# Patient Record
Sex: Female | Born: 1965 | Race: Black or African American | Hispanic: No | Marital: Single | Smoking: Current every day smoker
Health system: Southern US, Community
[De-identification: ages and names within clinical notes are randomized; demographics above are authoritative.]

## PROBLEM LIST (undated history)

## (undated) DIAGNOSIS — M719 Bursopathy, unspecified: Secondary | ICD-10-CM

## (undated) DIAGNOSIS — M1990 Unspecified osteoarthritis, unspecified site: Secondary | ICD-10-CM

## (undated) DIAGNOSIS — F32A Depression, unspecified: Secondary | ICD-10-CM

## (undated) DIAGNOSIS — B029 Zoster without complications: Secondary | ICD-10-CM

## (undated) DIAGNOSIS — I8290 Acute embolism and thrombosis of unspecified vein: Secondary | ICD-10-CM

## (undated) DIAGNOSIS — I824Z9 Acute embolism and thrombosis of unspecified deep veins of unspecified distal lower extremity: Secondary | ICD-10-CM

## (undated) DIAGNOSIS — F329 Major depressive disorder, single episode, unspecified: Secondary | ICD-10-CM

## (undated) HISTORY — DX: Depression, unspecified: F32.A

## (undated) HISTORY — DX: Acute embolism and thrombosis of unspecified deep veins of unspecified distal lower extremity: I82.4Z9

## (undated) HISTORY — DX: Major depressive disorder, single episode, unspecified: F32.9

## (undated) HISTORY — PX: LASER ABLATION: SHX1947

## (undated) HISTORY — PX: APPENDECTOMY: SHX54

---

## 2009-09-29 ENCOUNTER — Emergency Department (HOSPITAL_COMMUNITY): Admission: EM | Admit: 2009-09-29 | Discharge: 2009-09-29 | Payer: Self-pay | Admitting: Emergency Medicine

## 2010-04-11 ENCOUNTER — Emergency Department (HOSPITAL_COMMUNITY)
Admission: EM | Admit: 2010-04-11 | Discharge: 2010-04-11 | Payer: Self-pay | Source: Home / Self Care | Admitting: Emergency Medicine

## 2010-05-13 ENCOUNTER — Inpatient Hospital Stay (HOSPITAL_COMMUNITY)
Admission: EM | Admit: 2010-05-13 | Discharge: 2010-05-16 | Disposition: A | Discharge status: Other Acute Inpatient Hospital | Payer: Self-pay | Source: Home / Self Care | Admitting: Emergency Medicine

## 2010-05-16 ENCOUNTER — Inpatient Hospital Stay (HOSPITAL_COMMUNITY): Admission: AD | Admit: 2010-05-16 | Discharge: 2010-05-17 | Payer: Self-pay | Admitting: Psychiatry

## 2010-05-16 ENCOUNTER — Ambulatory Visit: Payer: Self-pay | Admitting: Psychiatry

## 2010-05-28 ENCOUNTER — Ambulatory Visit: Payer: Self-pay | Admitting: Family Medicine

## 2010-05-28 LAB — CONVERTED CEMR LAB: Total Protein: 7.3 g/dL (ref 6.0–8.3)

## 2010-06-07 ENCOUNTER — Ambulatory Visit: Payer: Self-pay | Admitting: Family Medicine

## 2010-07-06 ENCOUNTER — Ambulatory Visit: Payer: Self-pay | Admitting: Psychiatry

## 2010-10-05 ENCOUNTER — Emergency Department (HOSPITAL_COMMUNITY)
Admission: EM | Admit: 2010-10-05 | Discharge: 2010-10-05 | Payer: Self-pay | Source: Home / Self Care | Admitting: Emergency Medicine

## 2010-12-27 LAB — COMPREHENSIVE METABOLIC PANEL
ALT: 12 U/L (ref 0–35)
ALT: 12 U/L (ref 0–35)
ALT: 13 U/L (ref 0–35)
ALT: 9 U/L (ref 0–35)
AST: 18 U/L (ref 0–37)
AST: 22 U/L (ref 0–37)
Albumin: 3.6 g/dL (ref 3.5–5.2)
Alkaline Phosphatase: 31 U/L — ABNORMAL LOW (ref 39–117)
Alkaline Phosphatase: 36 U/L — ABNORMAL LOW (ref 39–117)
Alkaline Phosphatase: 37 U/L — ABNORMAL LOW (ref 39–117)
BUN: 4 mg/dL — ABNORMAL LOW (ref 6–23)
BUN: 4 mg/dL — ABNORMAL LOW (ref 6–23)
BUN: 5 mg/dL — ABNORMAL LOW (ref 6–23)
BUN: 8 mg/dL (ref 6–23)
CO2: 17 mEq/L — ABNORMAL LOW (ref 19–32)
CO2: 26 mEq/L (ref 19–32)
Chloride: 109 mEq/L (ref 96–112)
Chloride: 109 mEq/L (ref 96–112)
Chloride: 110 mEq/L (ref 96–112)
GFR calc Af Amer: >60 mL/min (ref >60)
GFR calc Af Amer: >60 mL/min (ref >60)
GFR calc non Af Amer: >60 mL/min (ref >60)
GFR calc non Af Amer: >60 mL/min (ref >60)
GFR calc non Af Amer: >60 mL/min (ref >60)
Glucose, Bld: 104 mg/dL — ABNORMAL HIGH (ref 70–99)
Glucose, Bld: 111 mg/dL — ABNORMAL HIGH (ref 70–99)
Glucose, Bld: 126 mg/dL — ABNORMAL HIGH (ref 70–99)
Glucose, Bld: 99 mg/dL (ref 70–99)
Potassium: 3.4 mEq/L — ABNORMAL LOW (ref 3.5–5.1)
Potassium: 3.6 mEq/L (ref 3.5–5.1)
Potassium: 3.7 mEq/L (ref 3.5–5.1)
Potassium: 3.8 mEq/L (ref 3.5–5.1)
Sodium: 137 mEq/L (ref 135–145)
Sodium: 137 mEq/L (ref 135–145)
Sodium: 140 mEq/L (ref 135–145)
Total Bilirubin: 3.1 mg/dL — ABNORMAL HIGH (ref 0.3–1.2)
Total Bilirubin: 3.6 mg/dL — ABNORMAL HIGH (ref 0.3–1.2)
Total Protein: 6.7 g/dL (ref 6.0–8.3)
Total Protein: 6.8 g/dL (ref 6.0–8.3)

## 2010-12-27 LAB — URINALYSIS, ROUTINE W REFLEX MICROSCOPIC
Bilirubin Urine: NEGATIVE
Hgb urine dipstick: NEGATIVE
Protein, ur: NEGATIVE mg/dL
pH: 5.5 (ref 5.0–8.0)

## 2010-12-27 LAB — CBC
HCT: 38.4 % (ref 36.0–46.0)
HCT: 38.6 % (ref 36.0–46.0)
HCT: 40.3 % (ref 36.0–46.0)
Hemoglobin: 12.9 g/dL (ref 12.0–15.0)
Hemoglobin: 14.2 g/dL (ref 12.0–15.0)
MCH: 30.7 pg (ref 26.0–34.0)
MCHC: 35.2 g/dL (ref 30.0–36.0)
MCV: 88.8 fL (ref 78.0–100.0)
MCV: 90.1 fL (ref 78.0–100.0)
Platelets: 228 10*3/uL (ref 150–400)
Platelets: 239 10*3/uL (ref 150–400)
RBC: 4.54 MIL/uL (ref 3.87–5.11)
RDW: 12.7 % (ref 11.5–15.5)
WBC: 7 10*3/uL (ref 4.0–10.5)
WBC: 9.2 10*3/uL (ref 4.0–10.5)
WBC: 9.3 10*3/uL (ref 4.0–10.5)

## 2010-12-27 LAB — PROTIME-INR
INR: 1.01 (ref 0.00–1.49)
INR: 1.06 (ref 0.00–1.49)
INR: 1.2 (ref 0.00–1.49)
Prothrombin Time: 13.5 seconds (ref 11.6–15.2)
Prothrombin Time: 14 seconds (ref 11.6–15.2)

## 2010-12-27 LAB — POCT PREGNANCY, URINE: Preg Test, Ur: NEGATIVE

## 2010-12-27 LAB — HEPATIC FUNCTION PANEL
ALT: 10 U/L (ref 0–35)
AST: 18 U/L (ref 0–37)
Bilirubin, Direct: 0.3 mg/dL (ref 0.0–0.3)
Indirect Bilirubin: 3.8 mg/dL — ABNORMAL HIGH (ref 0.3–0.9)
Total Bilirubin: 4.1 mg/dL — ABNORMAL HIGH (ref 0.3–1.2)

## 2010-12-27 LAB — RAPID URINE DRUG SCREEN, HOSP PERFORMED
Barbiturates: NOT DETECTED
Benzodiazepines: NOT DETECTED
Opiates: POSITIVE — AB

## 2010-12-27 LAB — LACTATE DEHYDROGENASE: LDH: 181 U/L (ref 94–250)

## 2010-12-27 LAB — ACETAMINOPHEN LEVEL
Acetaminophen (Tylenol), Serum: 251.1 ug/mL (ref 10–30)
Acetaminophen (Tylenol), Serum: <10 ug/mL — ABNORMAL LOW (ref 10–30)

## 2010-12-27 LAB — MAGNESIUM: Magnesium: 2.2 mg/dL (ref 1.5–2.5)

## 2010-12-27 LAB — TSH: TSH: 2.661 u[IU]/mL (ref 0.350–4.500)

## 2010-12-27 LAB — DIFFERENTIAL
Basophils Absolute: 0 10*3/uL (ref 0.0–0.1)
Basophils Absolute: 0 10*3/uL (ref 0.0–0.1)
Basophils Relative: 0 % (ref 0–1)
Basophils Relative: 0 % (ref 0–1)
Eosinophils Absolute: 0 10*3/uL (ref 0.0–0.7)
Eosinophils Absolute: 0.1 10*3/uL (ref 0.0–0.7)
Eosinophils Relative: 0 % (ref 0–5)
Lymphocytes Relative: 18 % (ref 12–46)
Lymphs Abs: 1.6 10*3/uL (ref 0.7–4.0)
Neutrophils Relative %: 72 % (ref 43–77)
Neutrophils Relative %: 74 % (ref 43–77)

## 2010-12-27 LAB — SALICYLATE LEVEL: Salicylate Lvl: <4 mg/dL (ref 2.8–20.0)

## 2010-12-27 LAB — APTT: aPTT: 29 seconds (ref 24–37)

## 2010-12-27 LAB — SAVE SMEAR

## 2010-12-29 LAB — CBC
HCT: 42.8 % (ref 36.0–46.0)
MCHC: 34 g/dL (ref 30.0–36.0)
Platelets: 205 10*3/uL (ref 150–400)
RDW: 13.3 % (ref 11.5–15.5)

## 2010-12-29 LAB — POCT I-STAT, CHEM 8
Creatinine, Ser: 0.9 mg/dL (ref 0.4–1.2)
Hemoglobin: 15.3 g/dL — ABNORMAL HIGH (ref 12.0–15.0)
Sodium: 140 mEq/L (ref 135–145)
TCO2: 23 mmol/L (ref 0–100)

## 2010-12-29 LAB — DIFFERENTIAL
Basophils Absolute: 0 10*3/uL (ref 0.0–0.1)
Basophils Relative: 1 % (ref 0–1)
Monocytes Absolute: 0.4 10*3/uL (ref 0.1–1.0)
Neutro Abs: 4.2 10*3/uL (ref 1.7–7.7)
Neutrophils Relative %: 60 % (ref 43–77)

## 2010-12-29 LAB — POCT CARDIAC MARKERS: Myoglobin, poc: 38.4 ng/mL (ref 12–200)

## 2011-01-15 ENCOUNTER — Inpatient Hospital Stay (INDEPENDENT_AMBULATORY_CARE_PROVIDER_SITE_OTHER)
Admission: RE | Admit: 2011-01-15 | Discharge: 2011-01-15 | Disposition: A | Discharge status: Home / Self Care | Payer: Self-pay | Source: Ambulatory Visit | Attending: Family Medicine | Admitting: Family Medicine

## 2011-01-15 DIAGNOSIS — N739 Female pelvic inflammatory disease, unspecified: Secondary | ICD-10-CM

## 2011-01-15 LAB — POCT URINALYSIS DIP (DEVICE)
Bilirubin Urine: NEGATIVE
Hgb urine dipstick: NEGATIVE
Ketones, ur: NEGATIVE mg/dL
pH: 5 (ref 5.0–8.0)

## 2011-01-15 LAB — DIFFERENTIAL
Eosinophils Relative: 1 % (ref 0–5)
Lymphocytes Relative: 32 % (ref 12–46)
Lymphs Abs: 2.7 10*3/uL (ref 0.7–4.0)
Monocytes Absolute: 0.5 10*3/uL (ref 0.1–1.0)
Monocytes Relative: 6 % (ref 3–12)

## 2011-01-15 LAB — CBC
HCT: 40.7 % (ref 36.0–46.0)
MCHC: 34.6 g/dL (ref 30.0–36.0)
MCV: 87.5 fL (ref 78.0–100.0)
RDW: 13 % (ref 11.5–15.5)

## 2011-01-16 LAB — GC/CHLAMYDIA PROBE AMP, GENITAL: Chlamydia, DNA Probe: NEGATIVE

## 2011-01-16 LAB — URINE CULTURE
Colony Count: NO GROWTH
Culture  Setup Time: 201204041740

## 2011-08-22 ENCOUNTER — Emergency Department (INDEPENDENT_AMBULATORY_CARE_PROVIDER_SITE_OTHER)
Admission: EM | Admit: 2011-08-22 | Discharge: 2011-08-22 | Disposition: A | Discharge status: Home / Self Care | Payer: Self-pay | Source: Home / Self Care | Attending: Emergency Medicine | Admitting: Emergency Medicine

## 2011-08-22 DIAGNOSIS — H6000 Abscess of external ear, unspecified ear: Secondary | ICD-10-CM

## 2011-08-22 DIAGNOSIS — H60399 Other infective otitis externa, unspecified ear: Secondary | ICD-10-CM

## 2011-08-22 HISTORY — DX: Unspecified osteoarthritis, unspecified site: M19.90

## 2011-08-22 MED ORDER — METRONIDAZOLE 0.75 % VA GEL: 1.0000 | Freq: Two times a day (BID) | VAGINAL | Status: AC

## 2011-08-22 MED ORDER — CEPHALEXIN 500 MG PO CAPS: 500.0000 mg | ORAL_CAPSULE | Freq: Three times a day (TID) | ORAL | Status: AC

## 2011-08-22 MED ORDER — ACETAMINOPHEN-CODEINE #3 300-30 MG PO TABS
1.0000 | ORAL_TABLET | ORAL | Status: AC | PRN
Start: 2011-08-22 — End: 2011-09-01

## 2011-08-22 NOTE — ED Notes (Signed)
Pain in her ear since yesterday; no relief w OTC medications

## 2011-08-22 NOTE — ED Provider Notes (Signed)
History     CSN: [id number] Arrival date & time: 08/22/2011  4:51 PM   First MD Initiated Contact with Patient 08/22/11 1652      Chief Complaint  Patient presents with  . Otalgia    (Consider location/radiation/quality/duration/timing/severity/associated sxs/prior treatment) HPI Comments: Janet Hester has had a two-day history of severe right ear pain. She has had chills, weakness, and slight sore throat. She denies any fever, nasal congestion, rhinorrhea, or cough. She tried ibuprofen but it didn't seem to help at all. She rates her pain as a 10 over 10 in intensity. It's worse if she chews or moves her ear in any way. She denies any drainage from the ear. She has not had ear problems in the past.  She is otherwise in good health and has had no other medical problems. She takes no medications and she's allergic to shellfish. Her last menstrual period was in 2005 when she had a uterine ablation. Her primary care physicians are [facility].  Patient is a 45 y.o. female presenting with ear pain.  Otalgia Pertinent negatives include no rhinorrhea, no sore throat, no abdominal pain, no diarrhea, no vomiting, no cough and no rash.    Past Medical History  Diagnosis Date  . Arthritis   . Hepatitis C     History reviewed. No pertinent past surgical history.  History reviewed. No pertinent family history.  History  Substance Use Topics  . Smoking status: Not on file  . Smokeless tobacco: Not on file  . Alcohol Use:     OB History    Grav Para Term Preterm Abortions TAB SAB Ect Mult Living                  Review of Systems  Constitutional: Negative for fever, chills and fatigue.  HENT: Positive for ear pain. Negative for congestion, sore throat, rhinorrhea, sneezing, neck stiffness, voice change and postnasal drip.   Eyes: Negative for pain, discharge and redness.  Respiratory: Negative for cough, chest tightness, shortness of breath and wheezing.     Gastrointestinal: Negative for nausea, vomiting, abdominal pain and diarrhea.  Skin: Negative for rash.    Allergies  Shellfish allergy  Home Medications   Current Outpatient Rx  Name Route Sig Dispense Refill  . ACETAMINOPHEN-CODEINE #3 300-30 MG PO TABS Oral Take 1-2 tablets by mouth every 4 (four) hours as needed for pain. 30 tablet 0  . CEPHALEXIN 500 MG PO CAPS Oral Take 1 capsule (500 mg total) by mouth 3 (three) times daily. 30 capsule 0  . METRONIDAZOLE 0.75 % VA GEL Vaginal Place 1 Applicatorful vaginally 2 (two) times daily. 70 g 0    BP 138/88  Pulse 77  Temp(Src) 99.1 F (37.3 C) (Oral)  Resp 18  SpO2 100%  Physical Exam  Nursing note and vitals reviewed. Constitutional: She appears well-developed and well-nourished. No distress.  HENT:  Head: Normocephalic and atraumatic.  Left Ear: External ear normal.  Nose: Nose normal.  Mouth/Throat: Oropharynx is clear and moist. No oropharyngeal exudate.       Lee right TM was normal with no inflammation or fluid behind it the distal ear canal was slightly swollen and there was a localized area of erythema and tenderness. There was no collection of pus. This was very tender to palpation.  Eyes: Conjunctivae and EOM are normal. Pupils are equal, round, and reactive to light. Right eye exhibits no discharge. Left eye exhibits no discharge.  Neck: Normal range of motion.  Neck supple.  Cardiovascular: Normal rate, regular rhythm and normal heart sounds.   Pulmonary/Chest: Effort normal and breath sounds normal. No stridor. No respiratory distress. She has no wheezes. She has no rales. She exhibits no tenderness.  Lymphadenopathy:    She has no cervical adenopathy.  Skin: Skin is warm and dry. No rash noted. She is not diaphoretic.    ED Course  Procedures (including critical care time)  Labs Reviewed - No data to display No results found.   1. Abscess of ear canal       MDM  She has a small, localized furuncle of  her external ear canal. The tympanic membrane itself is normal.        Roque Lias, MD 08/22/11 1742

## 2011-11-29 ENCOUNTER — Encounter (HOSPITAL_COMMUNITY): Payer: Self-pay | Admitting: Emergency Medicine

## 2011-11-29 ENCOUNTER — Emergency Department (HOSPITAL_COMMUNITY): Payer: Managed Care, Other (non HMO)

## 2011-11-29 ENCOUNTER — Emergency Department (HOSPITAL_COMMUNITY)
Admission: EM | Admit: 2011-11-29 | Discharge: 2011-11-29 | Disposition: A | Discharge status: Home / Self Care | Payer: Managed Care, Other (non HMO) | Attending: Emergency Medicine | Admitting: Emergency Medicine

## 2011-11-29 DIAGNOSIS — F17200 Nicotine dependence, unspecified, uncomplicated: Secondary | ICD-10-CM | POA: Insufficient documentation

## 2011-11-29 DIAGNOSIS — M129 Arthropathy, unspecified: Secondary | ICD-10-CM | POA: Insufficient documentation

## 2011-11-29 DIAGNOSIS — R109 Unspecified abdominal pain: Secondary | ICD-10-CM | POA: Insufficient documentation

## 2011-11-29 DIAGNOSIS — R10816 Epigastric abdominal tenderness: Secondary | ICD-10-CM | POA: Insufficient documentation

## 2011-11-29 DIAGNOSIS — Z8619 Personal history of other infectious and parasitic diseases: Secondary | ICD-10-CM | POA: Insufficient documentation

## 2011-11-29 DIAGNOSIS — R112 Nausea with vomiting, unspecified: Secondary | ICD-10-CM | POA: Insufficient documentation

## 2011-11-29 DIAGNOSIS — R197 Diarrhea, unspecified: Secondary | ICD-10-CM | POA: Insufficient documentation

## 2011-11-29 LAB — LIPASE, BLOOD: Lipase: 16 U/L (ref 11–59)

## 2011-11-29 LAB — COMPREHENSIVE METABOLIC PANEL
Alkaline Phosphatase: 41 U/L (ref 39–117)
BUN: 8 mg/dL (ref 6–23)
Calcium: 9.4 mg/dL (ref 8.4–10.5)
GFR calc Af Amer: >90 mL/min (ref >90)
Glucose, Bld: 82 mg/dL (ref 70–99)
Total Protein: 7.4 g/dL (ref 6.0–8.3)

## 2011-11-29 LAB — CBC
HCT: 40.4 % (ref 36.0–46.0)
Hemoglobin: 14.2 g/dL (ref 12.0–15.0)
MCH: 30.5 pg (ref 26.0–34.0)
MCHC: 35.1 g/dL (ref 30.0–36.0)

## 2011-11-29 MED ORDER — SODIUM CHLORIDE 0.9 % IV BOLUS (SEPSIS)
1000.0000 mL | Freq: Once | INTRAVENOUS | Status: AC
  Administered 2011-11-29: 1000 mL via INTRAVENOUS

## 2011-11-29 MED ORDER — HYDROMORPHONE HCL PF 1 MG/ML IJ SOLN
1.0000 mg | Freq: Once | INTRAMUSCULAR | Status: AC
  Administered 2011-11-29: 1 mg via INTRAVENOUS
  Filled 2011-11-29: qty 1

## 2011-11-29 MED ORDER — ONDANSETRON HCL 4 MG/2ML IJ SOLN
4.0000 mg | Freq: Once | INTRAMUSCULAR | Status: AC
  Administered 2011-11-29: 4 mg via INTRAVENOUS
  Filled 2011-11-29: qty 2

## 2011-11-29 MED ORDER — METOCLOPRAMIDE HCL 10 MG PO TABS: 10.0000 mg | ORAL_TABLET | Freq: Four times a day (QID) | ORAL | Status: AC | PRN

## 2011-11-29 NOTE — ED Provider Notes (Signed)
I was the primary provider of this patient during this ER visit. The patients care was continued in the CDU and managed in conjunction with my midlevel providers   Lyanne Co, MD 11/29/11 2049

## 2011-11-29 NOTE — ED Provider Notes (Signed)
History     CSN: [id number]  Arrival date & time 11/29/11  1203   First MD Initiated Contact with Patient 11/29/11 1251      Chief Complaint  Patient presents with  . Abdominal Pain     Patient is a 46 y.o. female presenting with abdominal pain. The history is provided by the patient.  Abdominal Pain The primary symptoms of the illness include abdominal pain.   patient reports she was awoken this morning with severe upper abdominal pain.  She reports the pain comes and goes.  She reports when the pain comes it makes her extremely nauseated and she struck several times.  She denies bloody or bilious vomit.  She also reports development of new diarrhea today.  She denies melena or hematochezia.  She denies dysuria and urinary frequency.  She denies fevers or chills.  She's never had symptoms like this before.  She still has her gallbladder but has never been told she has gallstones.  Nothing worsens her symptoms.  Nothing improves her symptoms.  Her pain is moderate to severe.  Past Medical History  Diagnosis Date  . Arthritis   . Hepatitis C     Past Surgical History  Procedure Date  . Laser ablation     History reviewed. No pertinent family history.  History  Substance Use Topics  . Smoking status: Current Everyday Smoker -- 0.5 packs/day  . Smokeless tobacco: Not on file  . Alcohol Use: No    OB History    Grav Para Term Preterm Abortions TAB SAB Ect Mult Living                  Review of Systems  Gastrointestinal: Positive for abdominal pain.  All other systems reviewed and are negative.    Allergies  Shellfish allergy  Home Medications   Current Outpatient Rx  Name Route Sig Dispense Refill  . IBUPROFEN 200 MG PO TABS Oral Take 400 mg by mouth every 6 (six) hours as needed. For pain    . OVER THE COUNTER MEDICATION Oral Take 1 tablet by mouth 3 (three) times daily after meals. Weight loss product from [employer]      BP 126/83  Pulse 89  Temp(Src) 98.7 F  (37.1 C) (Oral)  Resp 20  SpO2 95%  Physical Exam  Nursing note and vitals reviewed. Constitutional: She is oriented to person, place, and time. She appears well-developed and well-nourished.       Tearful.  Uncomfortable appearing.  HENT:  Head: Normocephalic and atraumatic.  Eyes: EOM are normal.  Neck: Normal range of motion.  Cardiovascular: Normal rate, regular rhythm and normal heart sounds.   Pulmonary/Chest: Effort normal and breath sounds normal.  Abdominal: Soft. She exhibits no distension.       Mild epigastric tenderness without guarding or rebound  Musculoskeletal: Normal range of motion.  Neurological: She is alert and oriented to person, place, and time.  Skin: Skin is warm and dry.  Psychiatric: She has a normal mood and affect. Judgment normal.    ED Course  Procedures (including critical care time)   Labs Reviewed  CBC  COMPREHENSIVE METABOLIC PANEL  LIPASE, BLOOD   US Abdomen Complete  11/29/2011  *RADIOLOGY REPORT*  Clinical Data:  Abdominal pain, nausea, vomiting, hepatitis C.  COMPLETE ABDOMINAL ULTRASOUND  Comparison:  05/15/2010  Findings:  Gallbladder:  No shadowing gallstones or echogenic sludge.  No gallbladder wall thickening or pericholecystic fluid.  Negative sonographic Murphy's sign according to  the ultrasound technologist.  Common bile duct:  5 mm diameter, unremarkable.  Liver:  No focal lesion identified.  Within normal limits in parenchymal echogenicity.  IVC:  Appears normal.  Pancreas:  No focal abnormality seen.  Spleen:  7.5 cm craniocaudal length, unremarkable.  Right Kidney:  11.1 cm. No hydronephrosis.  Well-preserved cortex. Normal size and parenchymal echotexture without focal abnormalities.  Left Kidney:  11 cm. No hydronephrosis.  Well-preserved cortex. Normal size and parenchymal echotexture without focal abnormalities.  Abdominal aorta:  No aneurysm identified.  IMPRESSION:  1.  Negative.  Normal gallbladder.  Original Report  Authenticated By: Thora Lance III, M.D.     1. Nausea vomiting and diarrhea       MDM  This may represent biliary colic.  Will treat pain and nausea.  Fluids given.  Labs and ultrasound pending.  We'll move the patient to the CDU for further treatment and evaluation        Lyanne Co, MD 11/29/11 2049

## 2011-11-29 NOTE — ED Provider Notes (Signed)
Patient moved into CDU. Pain is improved. Waiting on Abdominal Ultrasound.  16:35 - Ultrasound negative. The patient is not vomiting, or having any recurrent frequent bowel movements here. Will discharge home to follow up with her doctor.   Rodena Medin, PA-C 11/29/11 1635

## 2011-11-29 NOTE — ED Notes (Signed)
Pt reports generalized abdominal pain onset 0300 today. Pt reports N/V/D also.

## 2011-11-29 NOTE — ED Notes (Signed)
Pt transported to ultrasound for [place] of abd for abd pain

## 2011-11-29 NOTE — Discharge Instructions (Signed)
B.R.A.T. Diet    Your doctor has recommended the B.R.A.T. diet for you or your child until the condition improves. This is often used to help control diarrhea and vomiting symptoms. If you or your child can tolerate clear liquids, you may have:  · Bananas.   · Rice.   · Applesauce.   · Toast (and other simple starches such as crackers, potatoes, noodles).   Be sure to avoid dairy products, meats, and fatty foods until symptoms are better. Fruit juices such as apple, grape, and prune juice can make diarrhea worse. Avoid these. Continue this diet for 2 days or as instructed by your caregiver.  Document Released: 09/29/2005 Document Revised: 06/11/2011 Document Reviewed: 03/18/2007  [employer]® Patient Information ©2012 [employer].

## 2012-01-12 ENCOUNTER — Other Ambulatory Visit (HOSPITAL_COMMUNITY)
Admission: RE | Admit: 2012-01-12 | Discharge: 2012-01-12 | Disposition: A | Discharge status: Home / Self Care | Payer: Managed Care, Other (non HMO) | Source: Ambulatory Visit | Attending: Family Medicine | Admitting: Family Medicine

## 2012-01-12 DIAGNOSIS — Z0000 Encounter for general adult medical examination without abnormal findings: Secondary | ICD-10-CM | POA: Insufficient documentation

## 2012-01-20 ENCOUNTER — Other Ambulatory Visit (HOSPITAL_COMMUNITY): Payer: Self-pay | Admitting: Family Medicine

## 2012-01-20 DIAGNOSIS — Z1231 Encounter for screening mammogram for malignant neoplasm of breast: Secondary | ICD-10-CM

## 2012-02-13 ENCOUNTER — Ambulatory Visit (HOSPITAL_COMMUNITY)
Admission: RE | Admit: 2012-02-13 | Discharge: 2012-02-13 | Disposition: A | Discharge status: Home / Self Care | Payer: Managed Care, Other (non HMO) | Source: Ambulatory Visit | Attending: Family Medicine | Admitting: Family Medicine

## 2012-02-13 DIAGNOSIS — Z1231 Encounter for screening mammogram for malignant neoplasm of breast: Secondary | ICD-10-CM | POA: Insufficient documentation

## 2012-02-13 LAB — HM MAMMOGRAPHY: HM Mammogram: NORMAL

## 2012-03-13 LAB — HM PAP SMEAR: HM Pap smear: NORMAL

## 2012-05-04 ENCOUNTER — Ambulatory Visit (HOSPITAL_COMMUNITY)
Admission: RE | Admit: 2012-05-04 | Discharge: 2012-05-04 | Disposition: A | Discharge status: Home / Self Care | Payer: Managed Care, Other (non HMO) | Source: Ambulatory Visit | Attending: Family Medicine | Admitting: Family Medicine

## 2012-05-04 ENCOUNTER — Encounter (HOSPITAL_COMMUNITY): Payer: Self-pay | Admitting: *Deleted

## 2012-05-04 ENCOUNTER — Observation Stay (HOSPITAL_COMMUNITY)
Admission: EM | Admit: 2012-05-04 | Discharge: 2012-05-04 | Disposition: A | Discharge status: Home / Self Care | Payer: Managed Care, Other (non HMO) | Attending: Emergency Medicine | Admitting: Emergency Medicine

## 2012-05-04 DIAGNOSIS — R52 Pain, unspecified: Secondary | ICD-10-CM

## 2012-05-04 DIAGNOSIS — M7989 Other specified soft tissue disorders: Secondary | ICD-10-CM | POA: Insufficient documentation

## 2012-05-04 DIAGNOSIS — I824Z9 Acute embolism and thrombosis of unspecified deep veins of unspecified distal lower extremity: Secondary | ICD-10-CM

## 2012-05-04 DIAGNOSIS — I82409 Acute embolism and thrombosis of unspecified deep veins of unspecified lower extremity: Principal | ICD-10-CM | POA: Insufficient documentation

## 2012-05-04 DIAGNOSIS — F17200 Nicotine dependence, unspecified, uncomplicated: Secondary | ICD-10-CM | POA: Insufficient documentation

## 2012-05-04 DIAGNOSIS — M79609 Pain in unspecified limb: Secondary | ICD-10-CM

## 2012-05-04 DIAGNOSIS — R609 Edema, unspecified: Secondary | ICD-10-CM

## 2012-05-04 HISTORY — DX: Acute embolism and thrombosis of unspecified deep veins of unspecified distal lower extremity: I82.4Z9

## 2012-05-04 LAB — BASIC METABOLIC PANEL WITH GFR
BUN: 16 mg/dL (ref 6–23)
CO2: 20 meq/L (ref 19–32)
Calcium: 9.7 mg/dL (ref 8.4–10.5)
Chloride: 103 meq/L (ref 96–112)
Creatinine, Ser: 0.69 mg/dL (ref 0.50–1.10)
GFR calc Af Amer: >90 mL/min (ref >90)
GFR calc non Af Amer: >90 mL/min (ref >90)
Glucose, Bld: 92 mg/dL (ref 70–99)
Potassium: 3.8 meq/L (ref 3.5–5.1)
Sodium: 136 meq/L (ref 135–145)

## 2012-05-04 LAB — PROTIME-INR
INR: 1.01 (ref 0.00–1.49)
Prothrombin Time: 13.5 seconds (ref 11.6–15.2)

## 2012-05-04 LAB — CBC
Hemoglobin: 13.8 g/dL (ref 12.0–15.0)
MCHC: 35.6 g/dL (ref 30.0–36.0)
Platelets: 218 10*3/uL (ref 150–400)
RBC: 4.45 MIL/uL (ref 3.87–5.11)

## 2012-05-04 MED ORDER — ENOXAPARIN (LOVENOX) PATIENT EDUCATION KIT
PACK | Freq: Once | Status: AC
  Administered 2012-05-04: 19:00:00
  Filled 2012-05-04: qty 1

## 2012-05-04 MED ORDER — WARFARIN SODIUM 10 MG PO TABS
10.0000 mg | ORAL_TABLET | Freq: Once | ORAL | Status: AC
  Administered 2012-05-04: 10 mg via ORAL
  Filled 2012-05-04: qty 1

## 2012-05-04 MED ORDER — WARFARIN - PHARMACIST DOSING INPATIENT: Freq: Every day | Status: DC

## 2012-05-04 MED ORDER — ENOXAPARIN SODIUM 100 MG/ML ~~LOC~~ SOLN
100.0000 mg | Freq: Once | SUBCUTANEOUS | Status: AC
  Administered 2012-05-04: 100 mg via SUBCUTANEOUS
  Filled 2012-05-04: qty 1

## 2012-05-04 MED ORDER — ENOXAPARIN SODIUM 100 MG/ML ~~LOC~~ SOLN: 100.0000 mg | Freq: Two times a day (BID) | SUBCUTANEOUS | Status: DC

## 2012-05-04 MED ORDER — COUMADIN BOOK
Freq: Once | Status: AC
  Administered 2012-05-04: 19:00:00
  Filled 2012-05-04: qty 1

## 2012-05-04 MED ORDER — WARFARIN SODIUM 10 MG PO TABS: 10.0000 mg | ORAL_TABLET | Freq: Every day | ORAL | Status: DC

## 2012-05-04 NOTE — ED Provider Notes (Signed)
Pharmacy has done Lovenox and Coumadin dosing and Lovenox injection teaching. Patient has pain medication prescription already at pharmacy to pick up. Will write Rx for Lovenox and Coumadin - f/u PCP in 3 days.   Rodena Medin, PA-C 05/04/12 1900

## 2012-05-04 NOTE — ED Provider Notes (Signed)
History  This chart was scribed for No att. providers found by [facility] Day. This patient was seen in room TR06C/TR06C and the patient's care was started at 1626.   CSN: [id number]  Arrival date & time 05/04/12  1626   None     No chief complaint on file.   The history is provided by the patient. No language interpreter was used.   Janet Hester is a 46 y.o. female who presents to the Emergency Department complaining of 6/10 left leg pain for 9 hours and was referred by PCP Dr. Erling Conte here to have [place] of her leg where they found 3 blood clots. She states that she was told to ask for a prescription of Lovenox and warfarin. She states pain is worse with movement and she also has some tingling. She denies any problems breathing or SOB. She had a 6 hour drive to [place] last week. She denies smoking, drinking or illegal drug use. She denies any other injuries or illnesses at this time. Pain is worse with walking begins at left lower leg radiates to left popliteal area dull in quality. Not improved by anything. Moderate present   Past Medical History  Diagnosis Date  . Hepatitis C     Past Surgical History  Procedure Date  . Laser ablation     No family history on file.  Family history DVT History  Substance Use Topics  . Smoking status: Current Everyday Smoker -- 0.5 packs/day  . Smokeless tobacco: Not on file  . Alcohol Use: No    OB History    Grav Para Term Preterm Abortions TAB SAB Ect Mult Living                  Review of Systems  Constitutional: Negative.  Negative for fever.  HENT: Negative.   Respiratory: Negative.  Negative for cough and shortness of breath.   Cardiovascular: Negative.  Negative for chest pain.  Gastrointestinal: Negative.   Musculoskeletal: Negative.        Left lower leg pain/swelling.   Skin: Negative.   Neurological: Negative.  Negative for syncope.  Hematological: Negative.   Psychiatric/Behavioral: Negative.   All other systems reviewed  and are negative.    Allergies  Shellfish allergy  Home Medications   Current Outpatient Rx  Name Route Sig Dispense Refill  . CLONAZEPAM 0.5 MG PO TABS Oral Take 0.5 mg by mouth 2 (two) times daily as needed. For anxiety    . NAPROXEN SODIUM 220 MG PO TABS Oral Take 220-440 mg by mouth 2 (two) times daily with a meal. For headache      Triage Vitals: BP 122/79  Pulse 70  Temp 98.4 F (36.9 C) (Oral)  Resp 16  SpO2 95%  Physical Exam  Nursing note and vitals reviewed. Constitutional: She is oriented to person, place, and time. She appears well-developed and well-nourished.  HENT:  Head: Normocephalic and atraumatic.  Eyes: Conjunctivae are normal. Pupils are equal, round, and reactive to light.  Neck: Neck supple. No tracheal deviation present. No thyromegaly present.  Cardiovascular: Normal rate and regular rhythm.   No murmur heard. Pulmonary/Chest: Effort normal and breath sounds normal.  Abdominal: Soft. Bowel sounds are normal. She exhibits no distension. There is no tenderness.  Musculoskeletal: Normal range of motion. She exhibits edema and tenderness.       Left lower extremity swollen and tender at calf no redness DP pulse 2+ all extremities without redness or tenderness neurovascularly intact  Neurological:  She is alert and oriented to person, place, and time. Coordination normal.  Skin: Skin is warm and dry. No rash noted.  Psychiatric: She has a normal mood and affect.    ED Course  Procedures (including critical care time) DIAGNOSTIC STUDIES: Oxygen Saturat95ion is 95% on adequate, normal by my interpretation.    COORDINATION OF CARE: At 550pm Discussed treatment plan with patient which includes warfarin, warfarin enoxaprin injections . Patient agrees.   Labs Reviewed - No data to display No results found.   No diagnosis found.    MDM   Plan CDU protocol for DVT warfarin and enoxaparin therapy Patient to followup with Dr. Erling Conte in 3  days Diagnosis DVT left lower extremity    I personally performed the services described in this documentation, which was scribed in my presence. The recorded information has been reviewed and considered.    Doug Sou, MD 05/04/12 1800

## 2012-05-04 NOTE — Consult Note (Signed)
ANTICOAGULATION CONSULT NOTE - Initial Consult  Pharmacy Consult for Coumadin + Lovenox Indication: LLE DVT  Allergies  Allergen Reactions  . Shellfish Allergy Anaphylaxis   Patient Measurements: Height: 5' 11.75" (182.2 cm) Weight: 216 lb (97.977 kg) IBW/kg (Calculated) : 72.53   Vital Signs: Temp: 98.4 F (36.9 C) (07/23 1641) Temp src: Oral (07/23 1641) BP: 122/79 mmHg (07/23 1641) Pulse Rate: 70  (07/23 1641)  Labs: Pending  Estimated Creatinine Clearance: 114.7 ml/min (by C-G formula based on Cr of 0.62).  Medical History: Past Medical History  Diagnosis Date  . Hepatitis C    Assessment: 45yof with LLE DVT to begin lovenox and coumadin. She will need at least 5 days of overlap therapy. Labs are pending but anticipate CBC, renal function, and INR to be within normal limits.  Coumadin score = 9.  Goal of Therapy:  INR 2-3 Anti Xa 0.6-1.2 Monitor platelets by anticoagulation protocol: Yes   Plan:  1) Lovenox 100mg  sq q12 2) Coumadin 10mg  po daily until next INR check 3) Coumadin/lovenox education  Fredrik Rigger 05/04/2012,6:09 PM

## 2012-05-04 NOTE — ED Notes (Signed)
Pt was told that she has 3 blood clots in her left leg.  Pt is crying and thinks she is going to die here because her mother died here.  Pt mother died here from a blood clot.  Pt md told her that she needs coumadin and lovenox

## 2012-05-04 NOTE — Progress Notes (Addendum)
VASCULAR LAB PRELIMINARY  PRELIMINARY  PRELIMINARY  PRELIMINARY  Left lower extremity venous duplex completed.    Preliminary report: Acute DVT noted in a deep branch off of the popliteal vein, in the mid posterior tibial vein and in the mid to upper calf of the peroneal vein.  Report called to Deanna and Dr. Tally Joe.  Kyra Laffey, 05/04/2012, 4:10 PM

## 2012-05-04 NOTE — ED Notes (Signed)
Pt reports her left foot went numb this morning then she had excruciating left leg pain. She went to PCP, was sent over here to get checked out for blood clots. Pt's family has history of bld clots. Pt now c/o left leg pain.

## 2012-05-04 NOTE — Research (Signed)
[employer] dIET study, investigating  the utility of D-dimer in exclusion of DVT discussed with patient.  All questions and concerns were addressed and answered prior to obtaining informed consent.  No study procedures were initiated prior to obataing informed consent.  A signed copy of the consent was provided to the patient.  Please contact study coordinator or Dr. Chancy Milroy with any questions or concerns.

## 2012-05-04 NOTE — ED Provider Notes (Signed)
Medical screening examination/treatment/procedure(s) were conducted as a shared visit with non-physician practitioner(s) and myself.  I personally evaluated the patient during the encounter  Doug Sou, MD 05/04/12 2021

## 2012-05-20 ENCOUNTER — Telehealth: Payer: Self-pay | Admitting: *Deleted

## 2012-05-20 NOTE — Telephone Encounter (Signed)
PATIENT CONFIRMED OVER THE PHONE THE NEW DATE AND TIME ON 05-26-2012 STARTING AT 3:00PM

## 2012-05-21 ENCOUNTER — Other Ambulatory Visit (HOSPITAL_BASED_OUTPATIENT_CLINIC_OR_DEPARTMENT_OTHER): Payer: Managed Care, Other (non HMO) | Admitting: Lab

## 2012-05-21 ENCOUNTER — Ambulatory Visit: Payer: Managed Care, Other (non HMO)

## 2012-05-21 ENCOUNTER — Ambulatory Visit (HOSPITAL_BASED_OUTPATIENT_CLINIC_OR_DEPARTMENT_OTHER): Payer: Managed Care, Other (non HMO) | Admitting: Oncology

## 2012-05-21 ENCOUNTER — Encounter: Payer: Self-pay | Admitting: Oncology

## 2012-05-21 VITALS — BP 124/80 | HR 77 | Temp 98.7°F | Resp 20 | Ht 71.75 in | Wt 218.7 lb

## 2012-05-21 DIAGNOSIS — I824Z9 Acute embolism and thrombosis of unspecified deep veins of unspecified distal lower extremity: Secondary | ICD-10-CM

## 2012-05-21 NOTE — Patient Instructions (Addendum)
1.  Diagnosis:  Deep vein thrombosis of left leg.   2.  Treatment:  Coumadin with goal INR between 2-3 for about 6-9 months.  3.  Testing for thrombophilia:  Cannot be completely performed due to Coumadin on board.  4.  After 6-9 months, if repeat US shows no clot, we may consider going off of Coumadin for thrombophilia testing.  If any is tested positive, then we may consider life long anticoagulation.   A negative thrombophilia testing does not rule out hereditary causes (since we can only test for what we know causes recurrent clot.  We cannot test for what we do not know).  If patients have recurrent clots, then recommendation will be lifelong anticoagulation regardless of the result of thrombophilia.

## 2012-05-21 NOTE — Progress Notes (Signed)
[facility]  Telephone:([phone] Fax:([phone]) [phone]     INITIAL HEMATOLOGY CONSULTATION    Referral MD:  Dr. Tally Joe, M.D.  Reason for Referral:  Newly diagnosed distal left lower extremity DVT in 04/2012.     HPI: [name] is a 46 year old woman, with extensive history of DVT.  She smokes about 1 ppd of cigarettes.  She was on a 6 hour car-ride to [facility] on 04/22/2012 and back to [place] on 04/25/2012.  She felt well until 04/27/2012 when she felt numbness, pain, and swelling in the left calf.  An [place] on 05/04/2012 confirmed thrombosis in a branch of off left proximal popliteal vein, mid posterior tibial vein, and upper calf of the peroneal vein.  She was started on Lovenox and Coumadin bridging. Her symptoms have completely resolved since then. She was kindly referred to the cancer Center given extensive family history of deep vein thrombosis and pulmonary embolism.   [name] presented to the clinic for the first time today by herself. She reports feeling well. When she was on Lovenox bridging with Coumadin, she had some vaginal spotting for about 5 days. However the vaginal spotting has completed resolved. She denies any bleeding palpitation anywhere else. She denies chest pain, palpitation.  She is up-to-date with her age-appropriate cancer screening with negative mammogram earlier this year. Her gynecologist performed the past year and was also negative under this year.   Patient denies fever, anorexia, weight loss, fatigue, headache, visual changes, confusion, drenching night sweats, palpable lymph node swelling, mucositis, odynophagia, dysphagia, nausea vomiting, jaundice, chest pain, palpitation, shortness of breath, dyspnea on exertion, productive cough, gum bleeding, epistaxis, hematemesis, hemoptysis, abdominal pain, abdominal swelling, early satiety, melena, hematochezia, hematuria, skin rash, spontaneous bleeding, joint swelling, joint pain, heat or  cold intolerance, bowel bladder incontinence, back pain, focal motor weakness, paresthesia, depression, suicidal or homicidal ideation, feeling hopelessness.    Past Medical History  Diagnosis Date  . Depression   . 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   :    Past Surgical History  Procedure Date  . Laser ablation   :   CURRENT MEDS: Current Outpatient Prescriptions  Medication Sig Dispense Refill  . clonazePAM (KLONOPIN) 0.5 MG tablet Take 0.5 mg by mouth 2 (two) times daily as needed. For anxiety      . naproxen sodium (ANAPROX) 220 MG tablet Take 220-440 mg by mouth 2 (two) times daily with a meal. For headache      . warfarin (COUMADIN) 6 MG tablet Take 6 mg by mouth daily.          Allergies  Allergen Reactions  . Shellfish Allergy Anaphylaxis  :  Family History  Problem Relation Age of Onset  . Pulmonary embolism Mother   . Cancer Mother 70    cervical cancer  . Deep vein thrombosis Father   . Deep vein thrombosis Paternal Aunt   . Deep vein thrombosis Paternal Uncle   . Deep vein thrombosis Daughter   . Pulmonary embolism Daughter   . Deep vein thrombosis Daughter   :  History   Social History  . Marital Status: Single    Spouse Name: N/A    Number of Children: 3  . Years of Education: N/A   Occupational History  .      accounting  Social History Main Topics  . Smoking status: Current Everyday Smoker -- 1.0 packs/day for 20 years  . Smokeless tobacco: Not on file  . Alcohol Use: No  . Drug Use: No  . Sexually Active:    Other Topics Concern  . Not on file   Social History Narrative  . No narrative on file  :  REVIEW OF SYSTEM:  The rest of the 14-point review of sytem was negative.   Exam: ECOG 0.   General:  well-nourished woman in no acute distress.  Eyes:  no scleral icterus.  ENT:  There were no oropharyngeal  lesions.  Neck was without thyromegaly.  Lymphatics:  Negative cervical, supraclavicular or axillary adenopathy.  Respiratory: lungs were clear bilaterally without wheezing or crackles.  Cardiovascular:  Regular rate and rhythm, S1/S2, without murmur, rub or gallop.  There was no pedal edema.  GI:  abdomen was soft, flat, nontender, nondistended, without organomegaly.  Muscoloskeletal:  no spinal tenderness of palpation of vertebral spine. Her bilateral low she made did not show any palpable cord, swelling, or pain on palpation. Skin exam was without echymosis, petichae.  Neuro exam was nonfocal.  Patient was able to get on and off exam table without assistance.  Gait was normal.  Patient was alerted and oriented.  Attention was good.   Language was appropriate.  Mood was normal without depression.  Speech was not pressured.  Thought content was not tangential.    LABS:  Lab Results  Component Value Date   WBC 7.9 05/04/2012   HGB 13.8 05/04/2012   HCT 38.8 05/04/2012   PLT 218 05/04/2012   GLUCOSE 92 05/04/2012   ALT 10 11/29/2011   AST 16 11/29/2011   NA 136 05/04/2012   K 3.8 05/04/2012   CL 103 05/04/2012   CREATININE 0.69 05/04/2012   BUN 16 05/04/2012   CO2 20 05/04/2012   INR 1.01 05/04/2012    ASSESSMENT AND PLAN:   1.  Diagnosis:  Deep vein thrombosis of left leg with extensive family history of deep vein thrombosis and pulmonary embolism.  This episode could be provoked the cause of the prolonged car rides back and forth from [place] the week prior to diagnosis of deep vein thrombosis. This is her first episode of provoked deep vein thrombosis. 2.  Treatment:  Coumadin with goal INR between 2-3 for about 6-9 months.  She would like to follow with the cancer Center Coumadin clinic. I referred her to Coumadin clinic within the next 2 weeks.  INR is pending today. I will contact him next week with the results are not today. For now I advised to continue Coumadin at current dose at 6 m by mouth each  bedtime. 3.  Testing for thrombophilia:  High pretest probably given extensive family history. I cannot be completely performed due to Coumadin on board. I can only sent for prothrombin 2 mutation and factor V Leiden testing today. 4.  After 6 months, if repeat US shows resolution of acute DVT, we may consider going off of Coumadin for thrombophilia testing.  If she is positive thrombophilia, then we may consider life long anticoagulation.   A negative thrombophilia testing does not rule out hereditary causes (since we can only test for what we know causes recurrent thrombosis.  We cannot test for what we do not know).  If patients have recurrent clots, then recommendation will be lifelong anticoagulation regardless of the result of thrombophilia.   5.  patient education: I discussed  with her the mechanism of Coumadin. I advised her to contact us if she is on antibiotic to decrease the chance of supra-therapeutic INR and resultant bleeding. I educated her on a stable died of cream and leafy vegetables to decrease her risk of unstable INR.  I personally do not use direct factor Xa inhibitor or direct thrombin inhibitor. They are oral in combination which is easy to take and without need for monitoring. However there is no antidote to these direct oral inhibitors and therefore I do not use them at this time.   6.  Follow up:  With cancer Center Coumadin clinic in about 2 weeks. I will see her in about 6 months.   Thank you for this referral.     The length of time of the face-to-face encounter was 30  minutes. More than 50% of time was spent counseling and coordination of care.

## 2012-05-24 ENCOUNTER — Telehealth: Payer: Self-pay | Admitting: *Deleted

## 2012-05-24 ENCOUNTER — Other Ambulatory Visit: Payer: Self-pay | Admitting: Oncology

## 2012-05-24 DIAGNOSIS — I824Z9 Acute embolism and thrombosis of unspecified deep veins of unspecified distal lower extremity: Secondary | ICD-10-CM

## 2012-05-24 LAB — FACTOR 5 LEIDEN

## 2012-05-24 LAB — PROTHROMBIN GENE MUTATION

## 2012-05-24 NOTE — Progress Notes (Signed)
Scheduled patient for PT/INR labs tomorrow 05/25/12 @ 0800; patient aware.

## 2012-05-24 NOTE — Telephone Encounter (Signed)
Gave patient appointment for 06-04-2012 the coumdin clinic per orders from 05-21-2012 gave patient appointment to come back and see the md  In six months

## 2012-05-25 ENCOUNTER — Telehealth: Payer: Self-pay

## 2012-05-25 ENCOUNTER — Other Ambulatory Visit (HOSPITAL_BASED_OUTPATIENT_CLINIC_OR_DEPARTMENT_OTHER): Payer: Managed Care, Other (non HMO) | Admitting: Lab

## 2012-05-25 DIAGNOSIS — I824Z9 Acute embolism and thrombosis of unspecified deep veins of unspecified distal lower extremity: Secondary | ICD-10-CM

## 2012-05-25 LAB — PROTIME-INR
INR: 2.7 (ref 2.00–3.50)
Protime: 32.4 Seconds — ABNORMAL HIGH (ref 10.6–13.4)

## 2012-05-25 NOTE — Telephone Encounter (Signed)
Message copied by Kallie Locks on Tue May 25, 2012 10:51 AM ------      Message from: Jethro Bolus T      Created: Tue May 25, 2012  8:59 AM       Please call pt, and inform her that her INR today was 2.7 (therapeutic range) and advise her on no change to her current Coumadin dose.  Thanks.

## 2012-05-26 ENCOUNTER — Ambulatory Visit: Payer: Managed Care, Other (non HMO)

## 2012-05-26 ENCOUNTER — Other Ambulatory Visit: Payer: Managed Care, Other (non HMO) | Admitting: Lab

## 2012-05-26 ENCOUNTER — Ambulatory Visit: Payer: Managed Care, Other (non HMO) | Admitting: Oncology

## 2012-06-03 ENCOUNTER — Other Ambulatory Visit: Payer: Self-pay | Admitting: Pharmacist

## 2012-06-03 DIAGNOSIS — I82409 Acute embolism and thrombosis of unspecified deep veins of unspecified lower extremity: Secondary | ICD-10-CM

## 2012-06-04 ENCOUNTER — Other Ambulatory Visit (HOSPITAL_BASED_OUTPATIENT_CLINIC_OR_DEPARTMENT_OTHER): Payer: Managed Care, Other (non HMO) | Admitting: Lab

## 2012-06-04 ENCOUNTER — Ambulatory Visit (HOSPITAL_BASED_OUTPATIENT_CLINIC_OR_DEPARTMENT_OTHER): Payer: Managed Care, Other (non HMO) | Admitting: Pharmacist

## 2012-06-04 DIAGNOSIS — I824Z9 Acute embolism and thrombosis of unspecified deep veins of unspecified distal lower extremity: Secondary | ICD-10-CM

## 2012-06-04 DIAGNOSIS — I82409 Acute embolism and thrombosis of unspecified deep veins of unspecified lower extremity: Secondary | ICD-10-CM

## 2012-06-04 NOTE — Progress Notes (Signed)
DVT's  diagnosed 05/04/12. Swelling around her knee (where the clots are located) is at baseline. She evaluates it daily and compares it to her other knee. Nothing has changed for pt (meds/diet, etc.). No missed doses. She has been on 6mg  daily since 05/18/12. (INR on 05/18/12 = 3.5 at outside MD office). INR on 05/25/12 = 2.7. Pt will take 9mg  today.  Then continue 6mg  daily on 06/05/12.  We will recheck INR in 1 week on 06/10/12: 8am = lab and 8:15am for Coumadin clinic. Discussed with Dr. Gaylyn Rong, pt has been on anticoagulation for 1 month. Will hold off on Lovenox. Recommended for patient to wear compression stockings. MD requests we try to stick with one tablet strength, if possible.

## 2012-06-04 NOTE — Patient Instructions (Addendum)
Take 9mg  today (1&1/2) tablets.  Then continue 6mg  daily on 06/05/12.  We will recheck INR in 1 week on 06/10/12: 8am = lab and 8:15am for Coumadin clinic.

## 2012-06-10 ENCOUNTER — Other Ambulatory Visit (HOSPITAL_BASED_OUTPATIENT_CLINIC_OR_DEPARTMENT_OTHER): Payer: Managed Care, Other (non HMO) | Admitting: Lab

## 2012-06-10 ENCOUNTER — Ambulatory Visit (HOSPITAL_BASED_OUTPATIENT_CLINIC_OR_DEPARTMENT_OTHER): Payer: Managed Care, Other (non HMO) | Admitting: Pharmacist

## 2012-06-10 DIAGNOSIS — I824Z9 Acute embolism and thrombosis of unspecified deep veins of unspecified distal lower extremity: Secondary | ICD-10-CM

## 2012-06-10 DIAGNOSIS — I82409 Acute embolism and thrombosis of unspecified deep veins of unspecified lower extremity: Secondary | ICD-10-CM

## 2012-06-10 LAB — POCT INR: INR: 1.8

## 2012-06-10 LAB — PROTIME-INR: INR: 1.8 — ABNORMAL LOW (ref 2.00–3.50)

## 2012-06-10 NOTE — Progress Notes (Signed)
INR subtherapeutic today (1.8) after taking 9mg  x 1, then returning to 6mg  daily. No problems with bleeding or bruising.  No missed doses.  She feels that the swelling in her legs from her clots is improving/going down. Answered multiple questions today that the patient had about how clots form, how Coumadin works, and what she can do at home to help keep her INR stable. Will have patient increase her dose to 6mg  daily except 9mg  on Mondays and Fridays. Recheck INR in 1 week to evaluate response and ensure she returns to goal range.

## 2012-06-17 ENCOUNTER — Ambulatory Visit (HOSPITAL_BASED_OUTPATIENT_CLINIC_OR_DEPARTMENT_OTHER): Payer: Managed Care, Other (non HMO) | Admitting: Pharmacist

## 2012-06-17 ENCOUNTER — Other Ambulatory Visit (HOSPITAL_BASED_OUTPATIENT_CLINIC_OR_DEPARTMENT_OTHER): Payer: Managed Care, Other (non HMO) | Admitting: Lab

## 2012-06-17 DIAGNOSIS — I824Z9 Acute embolism and thrombosis of unspecified deep veins of unspecified distal lower extremity: Secondary | ICD-10-CM

## 2012-06-17 DIAGNOSIS — I82409 Acute embolism and thrombosis of unspecified deep veins of unspecified lower extremity: Secondary | ICD-10-CM

## 2012-06-17 LAB — PROTIME-INR
INR: 2.2 (ref 2.00–3.50)
Protime: 26.4 Seconds — ABNORMAL HIGH (ref 10.6–13.4)

## 2012-06-17 NOTE — Progress Notes (Signed)
INR therapeutic today (2.2) on 6mg  daily except 9mg  on MF No missed doses.  No problems.  Trying to moderate her intake of foods high in Vitamin K - avoiding most greens, but did eat small serving of coleslaw at cookout over weekend. Also stopped using naproxen for HA pain since our discussion last week about use of NSAIDs with warfarin.  She is using OTC Tylenol to control HA pain now. Will continue current dose and recheck INR in 1 week to ensure that she stays in therapeutic range.

## 2012-06-23 ENCOUNTER — Other Ambulatory Visit (HOSPITAL_BASED_OUTPATIENT_CLINIC_OR_DEPARTMENT_OTHER): Payer: Managed Care, Other (non HMO) | Admitting: Lab

## 2012-06-23 ENCOUNTER — Ambulatory Visit (HOSPITAL_BASED_OUTPATIENT_CLINIC_OR_DEPARTMENT_OTHER): Payer: Managed Care, Other (non HMO) | Admitting: Pharmacist

## 2012-06-23 DIAGNOSIS — I82409 Acute embolism and thrombosis of unspecified deep veins of unspecified lower extremity: Secondary | ICD-10-CM

## 2012-06-23 DIAGNOSIS — I824Z9 Acute embolism and thrombosis of unspecified deep veins of unspecified distal lower extremity: Secondary | ICD-10-CM

## 2012-06-23 LAB — PROTIME-INR

## 2012-06-23 NOTE — Progress Notes (Signed)
INR subtherapeutic today (1.8) No missed doses.  No changes besides drinking Lipton Green Tea in the morning.  Uncertain the effect of green tea on INR - can have low to high quantities of Vitamin K.   No problems with bleeding or s/s clotting. Will increase current dose to 6mg  daily except 9mg  on MWF.  Recheck INR in 1 week.

## 2012-06-30 ENCOUNTER — Ambulatory Visit (HOSPITAL_BASED_OUTPATIENT_CLINIC_OR_DEPARTMENT_OTHER): Payer: Managed Care, Other (non HMO) | Admitting: Pharmacist

## 2012-06-30 ENCOUNTER — Other Ambulatory Visit (HOSPITAL_BASED_OUTPATIENT_CLINIC_OR_DEPARTMENT_OTHER): Payer: Managed Care, Other (non HMO) | Admitting: Lab

## 2012-06-30 DIAGNOSIS — I82409 Acute embolism and thrombosis of unspecified deep veins of unspecified lower extremity: Secondary | ICD-10-CM

## 2012-06-30 DIAGNOSIS — I824Z9 Acute embolism and thrombosis of unspecified deep veins of unspecified distal lower extremity: Secondary | ICD-10-CM

## 2012-06-30 LAB — PROTIME-INR

## 2012-06-30 NOTE — Progress Notes (Signed)
INR therapeutic today (2.0) on 6mg  daily except 9mg  on MWF No changes in meds or diet.  No problems. Continue current dose. Recheck INR in ~10 days.

## 2012-07-01 ENCOUNTER — Telehealth: Payer: Self-pay | Admitting: *Deleted

## 2012-07-01 NOTE — Telephone Encounter (Signed)
Pt called w/ c/o new headache started earlier this week on Monday, Tuesday and again today.  States was relieved w/ tylenol on Mon and Tues but not today.  Pt had to take naprosyn and that did not relieve her h/a either.  States h/a unusual for her and she is worried it  Could be a sign of blood clot to her brain.  Informed pt unlikely to develop clot while on coumadin and that headache is not likely related to her coagulation disorder.  Instructed her to call PCP if headache continues tomorrow.  She verbalized understanding.

## 2012-07-08 ENCOUNTER — Other Ambulatory Visit (HOSPITAL_BASED_OUTPATIENT_CLINIC_OR_DEPARTMENT_OTHER): Payer: Managed Care, Other (non HMO) | Admitting: Lab

## 2012-07-08 ENCOUNTER — Ambulatory Visit (HOSPITAL_BASED_OUTPATIENT_CLINIC_OR_DEPARTMENT_OTHER): Payer: Managed Care, Other (non HMO) | Admitting: Pharmacist

## 2012-07-08 DIAGNOSIS — Z5181 Encounter for therapeutic drug level monitoring: Secondary | ICD-10-CM

## 2012-07-08 DIAGNOSIS — Z7901 Long term (current) use of anticoagulants: Secondary | ICD-10-CM

## 2012-07-08 DIAGNOSIS — I824Z9 Acute embolism and thrombosis of unspecified deep veins of unspecified distal lower extremity: Secondary | ICD-10-CM

## 2012-07-08 DIAGNOSIS — I82409 Acute embolism and thrombosis of unspecified deep veins of unspecified lower extremity: Secondary | ICD-10-CM

## 2012-07-08 LAB — PROTIME-INR
INR: 2.3 (ref 2.00–3.50)
Protime: 27.6 Seconds — ABNORMAL HIGH (ref 10.6–13.4)

## 2012-07-08 NOTE — Progress Notes (Signed)
INR therapeutic today (2.3) on 6mg  daily except 9mg  on MWF. No problems with bleeding or bruising. No changes in diet.  Took APAP and Zyrtec last week to help her with a severe headache.  This has since resolved. Will continue current dose, and recheck INR in 2 weeks.

## 2012-07-19 ENCOUNTER — Ambulatory Visit (INDEPENDENT_AMBULATORY_CARE_PROVIDER_SITE_OTHER): Payer: Managed Care, Other (non HMO) | Admitting: Internal Medicine

## 2012-07-19 ENCOUNTER — Ambulatory Visit (INDEPENDENT_AMBULATORY_CARE_PROVIDER_SITE_OTHER)
Admission: RE | Admit: 2012-07-19 | Discharge: 2012-07-19 | Disposition: A | Discharge status: Home / Self Care | Payer: Managed Care, Other (non HMO) | Source: Ambulatory Visit | Attending: Internal Medicine | Admitting: Internal Medicine

## 2012-07-19 ENCOUNTER — Encounter: Payer: Self-pay | Admitting: Internal Medicine

## 2012-07-19 ENCOUNTER — Other Ambulatory Visit (INDEPENDENT_AMBULATORY_CARE_PROVIDER_SITE_OTHER): Payer: Managed Care, Other (non HMO)

## 2012-07-19 VITALS — BP 128/78 | HR 78 | Temp 97.8°F | Resp 16 | Ht 71.0 in | Wt 220.0 lb

## 2012-07-19 DIAGNOSIS — M79609 Pain in unspecified limb: Secondary | ICD-10-CM

## 2012-07-19 DIAGNOSIS — M79604 Pain in right leg: Secondary | ICD-10-CM

## 2012-07-19 DIAGNOSIS — I824Z9 Acute embolism and thrombosis of unspecified deep veins of unspecified distal lower extremity: Secondary | ICD-10-CM

## 2012-07-19 DIAGNOSIS — Z23 Encounter for immunization: Secondary | ICD-10-CM

## 2012-07-19 LAB — CBC WITH DIFFERENTIAL/PLATELET
Basophils Absolute: 0.1 10*3/uL (ref 0.0–0.1)
Basophils Relative: 0.9 % (ref 0.0–3.0)
Eosinophils Absolute: 0.1 10*3/uL (ref 0.0–0.7)
MCHC: 33.3 g/dL (ref 30.0–36.0)
MCV: 91.2 fl (ref 78.0–100.0)
Monocytes Absolute: 0.5 10*3/uL (ref 0.1–1.0)
Neutrophils Relative %: 59.1 % (ref 43.0–77.0)
RBC: 4.82 Mil/uL (ref 3.87–5.11)
RDW: 13.4 % (ref 11.5–14.6)

## 2012-07-19 LAB — COMPREHENSIVE METABOLIC PANEL
ALT: 18 U/L (ref 0–35)
AST: 21 U/L (ref 0–37)
Alkaline Phosphatase: 42 U/L (ref 39–117)
BUN: 10 mg/dL (ref 6–23)
Creatinine, Ser: 0.8 mg/dL (ref 0.4–1.2)
Potassium: 3.8 mEq/L (ref 3.5–5.1)

## 2012-07-19 LAB — C-REACTIVE PROTEIN: CRP: 1 mg/dL (ref 0.5–20.0)

## 2012-07-19 LAB — SEDIMENTATION RATE: Sed Rate: 18 mm/hr (ref 0–22)

## 2012-07-19 MED ORDER — TRAMADOL HCL 50 MG PO TABS: 50.0000 mg | ORAL_TABLET | Freq: Three times a day (TID) | ORAL | Status: DC | PRN

## 2012-07-19 NOTE — Assessment & Plan Note (Signed)
I will check a d-dimer and if it is elevated then will get an u/s of her LE's done

## 2012-07-19 NOTE — Progress Notes (Signed)
  Subjective:    Patient ID: Janet Hester, female    DOB: [birth year], 46 y.o.   MRN: [id number]  HPI  New to me she comes in today c/o an achy pain in her right lower leg for several weeks, there has not been any trauma or injury. She had DVT's in her LLE several months ago but the left leg is not painful or swollen today. She has tried percocet for the pain with significant relief but it caused itching.  Review of Systems  Constitutional: Negative for fever, chills, diaphoresis, activity change, appetite change, fatigue and unexpected weight change.  HENT: Negative.   Eyes: Negative.   Respiratory: Negative for cough, chest tightness, shortness of breath, wheezing and stridor.   Cardiovascular: Negative for chest pain, palpitations and leg swelling.  Gastrointestinal: Negative.   Genitourinary: Negative.   Musculoskeletal: Positive for arthralgias (diffusely in her right lower leg). Negative for myalgias, back pain, joint swelling and gait problem.  Skin: Negative for color change, pallor, rash and wound.  Neurological: Negative.   Hematological: Negative for adenopathy. Does not bruise/bleed easily.  Psychiatric/Behavioral: Negative.        Objective:   Physical Exam  Vitals reviewed. Constitutional: She is oriented to person, place, and time. She appears well-developed and well-nourished. No distress.  HENT:  Head: Normocephalic and atraumatic.  Mouth/Throat: Oropharynx is clear and moist. No oropharyngeal exudate.  Eyes: Conjunctivae normal are normal. Right eye exhibits no discharge. Left eye exhibits no discharge. No scleral icterus.  Neck: Normal range of motion. Neck supple. No JVD present. No tracheal deviation present. No thyromegaly present.  Cardiovascular: Normal rate, regular rhythm, normal heart sounds and intact distal pulses.  Exam reveals no gallop and no friction rub.   No murmur heard. Pulmonary/Chest: Effort normal and breath sounds normal. No stridor. No  respiratory distress. She has no wheezes. She has no rales. She exhibits no tenderness.  Abdominal: Soft. Bowel sounds are normal. She exhibits no distension and no mass. There is no tenderness. There is no rebound and no guarding.  Musculoskeletal: Normal range of motion. She exhibits no edema and no tenderness.       Right lower leg: Normal. She exhibits no tenderness, no bony tenderness, no swelling, no edema, no deformity and no laceration.       Left lower leg: Normal. She exhibits no tenderness, no bony tenderness, no swelling, no edema, no deformity and no laceration.  Lymphadenopathy:    She has no cervical adenopathy.  Neurological: She is oriented to person, place, and time.  Skin: Skin is warm and dry. No rash noted. She is not diaphoretic. No erythema. No pallor.  Psychiatric: She has a normal mood and affect. Her behavior is normal. Judgment and thought content normal.     Lab Results  Component Value Date   WBC 7.9 05/04/2012   HGB 13.8 05/04/2012   HCT 38.8 05/04/2012   PLT 218 05/04/2012   GLUCOSE 92 05/04/2012   ALT 10 11/29/2011   AST 16 11/29/2011   NA 136 05/04/2012   K 3.8 05/04/2012   CL 103 05/04/2012   CREATININE 0.69 05/04/2012   BUN 16 05/04/2012   CO2 20 05/04/2012   TSH 2.661 05/14/2010   INR 2.30 07/08/2012       Assessment & Plan:

## 2012-07-19 NOTE — Patient Instructions (Signed)

## 2012-07-19 NOTE — Assessment & Plan Note (Signed)
I will check a plain film to look for any boney lesions, will check for myopathy/inflammation with a CPK/ESR/CRP, if d-dimer is elevated will order an u/s of her LE's, she will try tramadol for pain

## 2012-07-21 ENCOUNTER — Ambulatory Visit (HOSPITAL_BASED_OUTPATIENT_CLINIC_OR_DEPARTMENT_OTHER): Payer: Managed Care, Other (non HMO) | Admitting: Pharmacist

## 2012-07-21 ENCOUNTER — Telehealth: Payer: Self-pay | Admitting: Internal Medicine

## 2012-07-21 ENCOUNTER — Other Ambulatory Visit (HOSPITAL_BASED_OUTPATIENT_CLINIC_OR_DEPARTMENT_OTHER): Payer: Managed Care, Other (non HMO) | Admitting: Lab

## 2012-07-21 DIAGNOSIS — I824Z9 Acute embolism and thrombosis of unspecified deep veins of unspecified distal lower extremity: Secondary | ICD-10-CM

## 2012-07-21 DIAGNOSIS — Z7901 Long term (current) use of anticoagulants: Secondary | ICD-10-CM

## 2012-07-21 DIAGNOSIS — I82409 Acute embolism and thrombosis of unspecified deep veins of unspecified lower extremity: Secondary | ICD-10-CM

## 2012-07-21 LAB — PROTIME-INR
INR: 3.3 (ref 2.00–3.50)
Protime: 39.6 Seconds — ABNORMAL HIGH (ref 10.6–13.4)

## 2012-07-21 NOTE — Telephone Encounter (Signed)
xrays and labs were all normal

## 2012-07-21 NOTE — Telephone Encounter (Signed)
Please advise, didn't see a letter Thanks

## 2012-07-21 NOTE — Telephone Encounter (Signed)
The patient called the triage line and is hoping to information on her test results - [phone]   Thanks!

## 2012-07-21 NOTE — Progress Notes (Signed)
INR slightly supratherapeutic today (3.3) on 6mg  daily except 9mg  on MWF. Pt has had tramadol added to her medications for new osteoarthritis, and also has been using Percocet PRN severe pain.  This causes her to itch, and I recommended use of Benadryl with the Percocet since it is a known side effect that opioids can cause histamine release.  Pt said that she would try this the next time her pain is severe enough to warrant Percocet.  Her diet has also been abnormal since she has not had the energy to cook for herself like she usually would.  Her intake of Vitamin K has decreased as she is resorting to just eating chips and french onion dip or a bagel for dinner. Pt does feel better now that she knows what is causing the pain, and will try to eat better, and utilize more frozen meals.  No problems with bleeding or bruising.  Will continue current dose for now, and recheck INR in 2 weeks to ensure return to therapeutic range.

## 2012-07-21 NOTE — Telephone Encounter (Signed)
Patient notified/[facility] 

## 2012-07-22 ENCOUNTER — Ambulatory Visit: Payer: Managed Care, Other (non HMO)

## 2012-07-23 ENCOUNTER — Ambulatory Visit (INDEPENDENT_AMBULATORY_CARE_PROVIDER_SITE_OTHER)
Admission: RE | Admit: 2012-07-23 | Discharge: 2012-07-23 | Disposition: A | Discharge status: Home / Self Care | Payer: Managed Care, Other (non HMO) | Source: Ambulatory Visit | Attending: Internal Medicine | Admitting: Internal Medicine

## 2012-07-23 ENCOUNTER — Ambulatory Visit (INDEPENDENT_AMBULATORY_CARE_PROVIDER_SITE_OTHER): Payer: Managed Care, Other (non HMO) | Admitting: Internal Medicine

## 2012-07-23 ENCOUNTER — Encounter: Payer: Self-pay | Admitting: Internal Medicine

## 2012-07-23 VITALS — BP 112/60 | HR 78 | Temp 98.6°F | Ht 71.0 in | Wt 219.0 lb

## 2012-07-23 DIAGNOSIS — M79609 Pain in unspecified limb: Secondary | ICD-10-CM

## 2012-07-23 DIAGNOSIS — M79671 Pain in right foot: Secondary | ICD-10-CM

## 2012-07-23 DIAGNOSIS — I824Z9 Acute embolism and thrombosis of unspecified deep veins of unspecified distal lower extremity: Secondary | ICD-10-CM

## 2012-07-23 MED ORDER — PREDNISONE (PAK) 10 MG PO TABS: 10.0000 mg | ORAL_TABLET | ORAL | Status: DC

## 2012-07-23 NOTE — Progress Notes (Signed)
  Subjective:    Patient ID: Janet Hester, female    DOB: [birth year], 46 y.o.   MRN: [id number]  HPI  complains of R foot pain Onset 9 days ago Denies trauma or foreign body Seen here for same 4 days ago > negative tib-fib film and labs reviewed, treated with tramadol the patient reports ineffective relief of symptoms Now associated with swelling across the dorsal surface of foot and pain with weightbearing  Past Medical History  Diagnosis Date  . Depression   . 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     Review of Systems  Musculoskeletal: Positive for gait problem. Negative for myalgias, back pain, joint swelling and arthralgias.  Skin: Negative for rash and wound.       Objective:   Physical Exam BP 112/60  Pulse 78  Temp 98.6 F (37 C) (Oral)  Ht 5\' 11"  (1.803 m)  Wt 219 lb (99.338 kg)  BMI 30.54 kg/m2  SpO2 95%  LMP 06/13/2004 General: No acute distress, daughter at side -uncomfortable with ambulatory weight bearing activity Musculoskeletal: Slight swelling right forefoot dorsal greater than plantar surface over metatarsal heads. No effusion or gross deformity -right ankle without effusion, full range of motion. Dorsal foot tender to palpation over metatarsal heads Skin: No erythema, discoloration or laceration. No evidence of foreign body on plantar surface of foot  Lab Results  Component Value Date   WBC 7.4 07/19/2012   HGB 14.6 07/19/2012   HCT 43.9 07/19/2012   PLT 231.0 07/19/2012   GLUCOSE 92 07/19/2012   ALT 18 07/19/2012   AST 21 07/19/2012   NA 136 07/19/2012   K 3.8 07/19/2012   CL 106 07/19/2012   CREATININE 0.8 07/19/2012   BUN 10 07/19/2012   CO2 24 07/19/2012   TSH 0.66 07/19/2012   INR 3.30 07/21/2012   Lab Results  Component Value Date   CKTOTAL 85 07/19/2012   Lab Results  Component Value Date   ESRSEDRATE 18 07/19/2012  Dg  Tibia/fibula Right  07/19/2012  *RADIOLOGY REPORT*  Clinical Data: Right tibia / fibula pain for 4 days. No injury.  RIGHT TIBIA AND FIBULA - 2 VIEW  Comparison: None.  Findings: Small calcaneal spur.  Mild pes planus deformity. No acute fracture or dislocation.  No periosteal reaction or cortical thickening.  No osseous destruction. Joint spaces maintained.  No evidence of stress fracture or stress reaction.  IMPRESSION: No acute osseous abnormality.   Original Report Authenticated By: Consuello Bossier, M.D.       Assessment & Plan:   R forefoot pain and swelling - dorsal surface>plantar surface - suspect tendontitis Denies trauma or foreign body risk Reviewed labs from earlier this week - normal  check plain film of foot rule out stress fracture or foreign body pred taper for inflammation - continue other pain meds as needed conservative care recommended: RICE -patient agrees to call if worse or unimproved

## 2012-07-23 NOTE — Patient Instructions (Signed)
It was good to see you today. Test(s) ordered today. Your results will be released to MyChart (or called to you) after review, usually within 72hours after test completion. If any changes need to be made, you will be notified at that same time. Pred taper for inflammation to help pain symptoms - Your prescription(s) have been submitted to your pharmacy. Please take as directed and contact our office if you believe you are having problem(s) with the medication(s). Keep the foot elevated while you are at rest and use ice pack to your foot 3 times a day for 10 minutes each time Call if symptoms are worse or unimproved further evaluation as needed

## 2012-07-23 NOTE — Assessment & Plan Note (Signed)
Chronic anticoagulation, followed by hematology clinic for same Interval history reviewed, no evidence of recurrent disease Lab Results  Component Value Date   INR 3.30 07/21/2012   INR 2.30 07/08/2012   INR 2.00 06/30/2012   PROTIME 39.6* 07/21/2012   PROTIME 27.6* 07/08/2012   PROTIME 24.0* 06/30/2012

## 2012-07-26 ENCOUNTER — Telehealth: Payer: Self-pay | Admitting: Internal Medicine

## 2012-07-26 NOTE — Telephone Encounter (Signed)
Pt informed of results of xray.  

## 2012-07-26 NOTE — Telephone Encounter (Signed)
The patient called and is hoping to get the results of her xray and to discuss her medication.

## 2012-07-29 ENCOUNTER — Telehealth: Payer: Self-pay | Admitting: Internal Medicine

## 2012-07-29 DIAGNOSIS — M79671 Pain in right foot: Secondary | ICD-10-CM

## 2012-07-29 NOTE — Telephone Encounter (Signed)
The patient called hoping to get a referral to a podiatrist for ongoing foot pain.  Her callback number is [phone], thanks!

## 2012-07-29 NOTE — Telephone Encounter (Signed)
done

## 2012-08-04 ENCOUNTER — Other Ambulatory Visit (HOSPITAL_BASED_OUTPATIENT_CLINIC_OR_DEPARTMENT_OTHER): Payer: Managed Care, Other (non HMO) | Admitting: Lab

## 2012-08-04 ENCOUNTER — Ambulatory Visit (HOSPITAL_BASED_OUTPATIENT_CLINIC_OR_DEPARTMENT_OTHER): Payer: Managed Care, Other (non HMO) | Admitting: Pharmacist

## 2012-08-04 DIAGNOSIS — I82409 Acute embolism and thrombosis of unspecified deep veins of unspecified lower extremity: Secondary | ICD-10-CM

## 2012-08-04 DIAGNOSIS — I824Z9 Acute embolism and thrombosis of unspecified deep veins of unspecified distal lower extremity: Secondary | ICD-10-CM

## 2012-08-04 NOTE — Progress Notes (Signed)
INR = 2.2 on 6 mg/day; 9 mg MWF No doses missed per pt. Has stress fx R foot.  Has a boot but not wearing it today b/c she cannot drive w/ it.  She has upcoming bone density scan.  May begin Ca++ supplementation pending results. INR therapeutic.  Cont same dose & repeat in 1 month. Pt aware if she has to start Ca++ that it is safe to combine w/ Coumadin. Pt states she WILL have collard greens at Thanksgiving.  She requests now that we work around that re: Coumadin dosing when she returns 1 week before Thanksgiving next month. Marily Lente, Pharm.D.

## 2012-08-10 ENCOUNTER — Other Ambulatory Visit: Payer: Self-pay | Admitting: *Deleted

## 2012-08-10 DIAGNOSIS — I824Z9 Acute embolism and thrombosis of unspecified deep veins of unspecified distal lower extremity: Secondary | ICD-10-CM

## 2012-08-10 MED ORDER — WARFARIN SODIUM 6 MG PO TABS: ORAL_TABLET | ORAL | Status: DC

## 2012-08-25 ENCOUNTER — Other Ambulatory Visit: Payer: Self-pay

## 2012-08-25 MED ORDER — EPINEPHRINE 0.3 MG/0.3ML IJ DEVI: 0.3000 mg | Freq: Once | INTRAMUSCULAR | Status: DC

## 2012-08-25 NOTE — Telephone Encounter (Signed)
Patient called [facility]mov[facility] requesting refill on epipen. RX sent via e-script

## 2012-09-01 ENCOUNTER — Ambulatory Visit (HOSPITAL_BASED_OUTPATIENT_CLINIC_OR_DEPARTMENT_OTHER): Payer: Managed Care, Other (non HMO) | Admitting: Pharmacist

## 2012-09-01 ENCOUNTER — Other Ambulatory Visit (HOSPITAL_BASED_OUTPATIENT_CLINIC_OR_DEPARTMENT_OTHER): Payer: Managed Care, Other (non HMO) | Admitting: Lab

## 2012-09-01 DIAGNOSIS — I82409 Acute embolism and thrombosis of unspecified deep veins of unspecified lower extremity: Secondary | ICD-10-CM

## 2012-09-01 DIAGNOSIS — I824Z9 Acute embolism and thrombosis of unspecified deep veins of unspecified distal lower extremity: Secondary | ICD-10-CM

## 2012-09-01 LAB — PROTIME-INR
INR: 2.8 (ref 2.00–3.50)
Protime: 33.6 Seconds — ABNORMAL HIGH (ref 10.6–13.4)

## 2012-09-01 NOTE — Progress Notes (Signed)
INR therapeutic today (2.8) on 6mg  daily except 9mg  on MWF. No changes in diet.  No missed doses.  Will start Prozac this week. Has had gum bleeding lately, but notes that she is overdue to visit the dentist. Confirms that there has been some pain in the area that she bleeds.  Advised pt to make an appt with her dentist, as there may be an underlying dental problem that needs fixed.  Also, try switching to a soft bristle toothbrush to decrease risk of injury to the gums.  Continue current dose.  Recheck INR in ~3 weeks when pt has a day off from work.  Will also allow [place] to assess whether her INR will be influenced by the Prozac.

## 2012-09-24 ENCOUNTER — Ambulatory Visit (HOSPITAL_BASED_OUTPATIENT_CLINIC_OR_DEPARTMENT_OTHER): Payer: Managed Care, Other (non HMO) | Admitting: Pharmacist

## 2012-09-24 ENCOUNTER — Other Ambulatory Visit (HOSPITAL_BASED_OUTPATIENT_CLINIC_OR_DEPARTMENT_OTHER): Payer: Managed Care, Other (non HMO) | Admitting: Lab

## 2012-09-24 DIAGNOSIS — I824Z9 Acute embolism and thrombosis of unspecified deep veins of unspecified distal lower extremity: Secondary | ICD-10-CM

## 2012-09-24 DIAGNOSIS — I82409 Acute embolism and thrombosis of unspecified deep veins of unspecified lower extremity: Secondary | ICD-10-CM

## 2012-09-24 LAB — PROTIME-INR
INR: 2.9 (ref 2.00–3.50)
Protime: 34.8 Seconds — ABNORMAL HIGH (ref 10.6–13.4)

## 2012-09-24 NOTE — Patient Instructions (Addendum)
Continue 6mg  daily with 9 mg on MWF.  Will recheck INR with MD appmt on Feb, 7, 2014.

## 2012-09-24 NOTE — Progress Notes (Signed)
Pt INR therapeutic today today at 2.9.  She has been therapeutic on 6mg  daily with 9 mg on MWF since September.  She did start on Prozac about a month ago for premenopausal symptoms and has seen relief.  She plans to discuss life long therapy with Dr. Gaylyn Rong at [facility] appmt in Feb. She has quite the family history of clotting on her fathers side but she is not aware of anyone going through with a thorough coagulapathy work up.  She plans to discuss her options in Feb.  Called [facility] on [address] to confirm she has 5 refills on hold for her coumadin.

## 2012-10-27 ENCOUNTER — Ambulatory Visit (INDEPENDENT_AMBULATORY_CARE_PROVIDER_SITE_OTHER): Payer: BC Managed Care – PPO | Admitting: Internal Medicine

## 2012-10-27 ENCOUNTER — Ambulatory Visit (INDEPENDENT_AMBULATORY_CARE_PROVIDER_SITE_OTHER)
Admission: RE | Admit: 2012-10-27 | Discharge: 2012-10-27 | Disposition: A | Discharge status: Home / Self Care | Payer: BC Managed Care – PPO | Source: Ambulatory Visit | Attending: Internal Medicine | Admitting: Internal Medicine

## 2012-10-27 ENCOUNTER — Encounter: Payer: Self-pay | Admitting: Internal Medicine

## 2012-10-27 VITALS — BP 108/72 | HR 76 | Temp 98.5°F | Resp 16 | Wt 220.0 lb

## 2012-10-27 DIAGNOSIS — M25551 Pain in right hip: Secondary | ICD-10-CM

## 2012-10-27 DIAGNOSIS — M25559 Pain in unspecified hip: Secondary | ICD-10-CM

## 2012-10-27 DIAGNOSIS — M7061 Trochanteric bursitis, right hip: Secondary | ICD-10-CM | POA: Insufficient documentation

## 2012-10-27 DIAGNOSIS — M76899 Other specified enthesopathies of unspecified lower limb, excluding foot: Secondary | ICD-10-CM

## 2012-10-27 MED ORDER — CELECOXIB 200 MG PO CAPS: 200.0000 mg | ORAL_CAPSULE | Freq: Every day | ORAL | Status: DC

## 2012-10-27 NOTE — Patient Instructions (Signed)

## 2012-10-27 NOTE — Assessment & Plan Note (Signed)
I will check a plan film to see if there is djd, spurring, etc

## 2012-10-27 NOTE — Progress Notes (Signed)
  Subjective:    Patient ID: Janet Hester, female    DOB: 16-Jan-1966, 47 y.o.   MRN: [id number]  Hip Pain  Incident onset: no injury, pain started 2 months ago. There was no injury mechanism. The pain is present in the right hip. The quality of the pain is described as aching. The pain is at a severity of 3/10. The pain is mild. The pain has been intermittent since onset. Pertinent negatives include no inability to bear weight, loss of motion, loss of sensation, muscle weakness, numbness or tingling. She reports no foreign bodies present. The symptoms are aggravated by movement and palpation. Treatments tried: tramadol. The treatment provided mild relief.      Review of Systems  Constitutional: Negative.   HENT: Negative.   Eyes: Negative.   Respiratory: Negative.  Negative for shortness of breath.   Cardiovascular: Negative.  Negative for chest pain, palpitations and leg swelling.  Gastrointestinal: Negative.   Genitourinary: Negative.  Negative for hematuria.  Musculoskeletal: Positive for arthralgias. Negative for myalgias, back pain, joint swelling and gait problem.  Skin: Negative.   Neurological: Negative.  Negative for tingling, weakness and numbness.  Hematological: Negative for adenopathy. Does not bruise/bleed easily.  Psychiatric/Behavioral: Negative.        Objective:   Physical Exam  Vitals reviewed. Constitutional: She is oriented to person, place, and time. She appears well-developed and well-nourished. No distress.  HENT:  Head: Normocephalic and atraumatic.  Mouth/Throat: Oropharynx is clear and moist. No oropharyngeal exudate.  Eyes: Conjunctivae normal are normal. Right eye exhibits no discharge. Left eye exhibits no discharge. No scleral icterus.  Neck: Normal range of motion. Neck supple. No JVD present. No tracheal deviation present. No thyromegaly present.  Cardiovascular: Normal rate, regular rhythm, normal heart sounds and intact distal pulses.  Exam reveals  no gallop and no friction rub.   No murmur heard. Pulmonary/Chest: Effort normal and breath sounds normal. No stridor. No respiratory distress. She has no wheezes. She has no rales. She exhibits no tenderness.  Abdominal: Soft. Bowel sounds are normal. She exhibits no distension and no mass. There is no tenderness. There is no rebound and no guarding.  Musculoskeletal: Normal range of motion. She exhibits no edema and no tenderness.       Right hip: She exhibits tenderness (over the GT) and bony tenderness (over the GT). She exhibits normal range of motion, normal strength, no swelling, no crepitus, no deformity and no laceration.  Lymphadenopathy:    She has no cervical adenopathy.  Neurological: She is oriented to person, place, and time. Coordination normal.  Skin: Skin is warm and dry. No rash noted. She is not diaphoretic. No erythema. No pallor.  Psychiatric: She has a normal mood and affect. Her behavior is normal. Judgment and thought content normal.          Assessment & Plan:

## 2012-10-27 NOTE — Assessment & Plan Note (Signed)
Start celebrex Refer to ortho - ? The need for an injection

## 2012-11-19 ENCOUNTER — Ambulatory Visit (HOSPITAL_BASED_OUTPATIENT_CLINIC_OR_DEPARTMENT_OTHER): Payer: BC Managed Care – PPO | Admitting: Oncology

## 2012-11-19 ENCOUNTER — Ambulatory Visit: Payer: Managed Care, Other (non HMO) | Admitting: Pharmacist

## 2012-11-19 ENCOUNTER — Other Ambulatory Visit (HOSPITAL_BASED_OUTPATIENT_CLINIC_OR_DEPARTMENT_OTHER): Payer: BC Managed Care – PPO | Admitting: Lab

## 2012-11-19 ENCOUNTER — Telehealth: Payer: Self-pay | Admitting: Oncology

## 2012-11-19 VITALS — BP 131/77 | HR 84 | Temp 98.0°F | Resp 20 | Ht 71.0 in | Wt 220.2 lb

## 2012-11-19 DIAGNOSIS — Z7901 Long term (current) use of anticoagulants: Secondary | ICD-10-CM

## 2012-11-19 DIAGNOSIS — I824Z9 Acute embolism and thrombosis of unspecified deep veins of unspecified distal lower extremity: Secondary | ICD-10-CM

## 2012-11-19 DIAGNOSIS — I82409 Acute embolism and thrombosis of unspecified deep veins of unspecified lower extremity: Secondary | ICD-10-CM

## 2012-11-19 LAB — CBC WITH DIFFERENTIAL/PLATELET
BASO%: 0.5 % (ref 0.0–2.0)
EOS%: 1 % (ref 0.0–7.0)
Eosinophils Absolute: 0.1 10*3/uL (ref 0.0–0.5)
LYMPH%: 33.2 % (ref 14.0–49.7)
MCH: 30.7 pg (ref 25.1–34.0)
MCHC: 34.9 g/dL (ref 31.5–36.0)
MCV: 88 fL (ref 79.5–101.0)
MONO%: 5.2 % (ref 0.0–14.0)
Platelets: 210 10*3/uL (ref 145–400)
RBC: 4.66 10*6/uL (ref 3.70–5.45)
nRBC: 0 % (ref 0–0)

## 2012-11-19 LAB — POCT INR: INR: 4

## 2012-11-19 NOTE — Progress Notes (Signed)
[facility]  [phone] [phone]   OFFICE PROGRESS NOTE   Cc:  Janet Linger, MD  DIAGNOSIS:  Left distal lower extremity DVT  CURRENT THERAPY:  Coumadin with goal INR 2-3 since 04/2012  INTERVAL HISTORY: Janet Hester 47 y.o. female returns for regular follow up.  She has been on Coumadin since 04/2012 without complication.  Her INR has been regularly therapeutic.  She had left leg swelling requiring compressive stocking which has completely resolved her swelling.  She denied any bleeding, chest pain, palpitation, SOB, syncope.  She is undergoing perimenopause with hot flash/night sweat.  Her mood has been depressed and she was recently prescribed Prozac.  She has right hip bursitis with pain.  Pain has been pressure, moderate to severe, made it difficult to walk, without leg weakness.  She had steroid injection with improvement of her symptoms.  The rest of the 14-point review of system was negative.   She is currently taking Coumadin 9mg  on M/W/F and 6 mg the rest of the week.  Updated family history notable for extensive family history of DVT, PE in 2 daughters, father, paternal aunts, uncles, and cousins.  She would like to continue Coumadin indefinitely and does not mind INR check.     Past Medical History  Diagnosis Date  . Depression   . 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     Past Surgical History  Procedure Date  . Laser ablation     Current Outpatient Prescriptions  Medication Sig Dispense Refill  . clonazePAM (KLONOPIN) 0.5 MG tablet Take 1 tablet by mouth Twice daily as needed.      Marland Kitchen EPINEPHrine (EPIPEN) 0.3 mg/0.3 mL DEVI Inject 0.3 mLs (0.3 mg total) into the muscle once.  2 Device  1  . FLUoxetine (PROZAC) 20 MG capsule Take 20 mg by mouth daily.      Marland Kitchen warfarin (COUMADIN) 6 MG tablet TAKE ONE TAB ONCE DAILY OR  AS DIRECTED BY PHYSICIAN  30 tablet  5    ALLERGIES:  is allergic to shellfish allergy and percocet.  REVIEW OF SYSTEMS:  The rest of the 14-point review of system was negative.   Filed Vitals:   11/19/12 0849  BP: 131/77  Pulse: 84  Temp: 98 F (36.7 C)  Resp: 20   Wt Readings from Last 3 Encounters:  11/19/12 220 lb 3.2 oz (99.882 kg)  10/27/12 220 lb (99.791 kg)  07/23/12 219 lb (99.338 kg)   ECOG Performance status: 0  PHYSICAL EXAMINATION:   General:  well-nourished woman, in no acute distress.  Eyes:  no scleral icterus.  ENT:  There were no oropharyngeal lesions.  Neck was without thyromegaly.  Lymphatics:  Negative cervical, supraclavicular or axillary adenopathy.  Respiratory: lungs were clear bilaterally without wheezing or crackles.  Cardiovascular:  Regular rate and rhythm, S1/S2, without murmur, rub or gallop.  There was no pedal edema.  GI:  abdomen was soft, flat, nontender, nondistended, without organomegaly.  Muscoloskeletal:  no spinal tenderness of palpation of vertebral spine.  Skin exam was without echymosis, petichae.  Neuro exam was nonfocal.  Patient was able to get on and off exam table without assistance.  Gait was normal.  Patient was alerted and oriented.  Attention was good.   Language was appropriate.  Mood was normal without depression.  Speech was not pressured.  Thought content was not tangential.      LABORATORY/RADIOLOGY DATA:  Lab Results  Component Value Date   WBC 6.0 11/19/2012   HGB 14.3 11/19/2012   HCT 41.0 11/19/2012   PLT 210 11/19/2012   GLUCOSE 92 07/19/2012   ALKPHOS 42 07/19/2012   ALT 18 07/19/2012   AST 21 07/19/2012   NA 136 07/19/2012   K 3.8 07/19/2012   CL 106 07/19/2012   CREATININE 0.8 07/19/2012   BUN 10 07/19/2012   CO2 24 07/19/2012   INR 4.00* 11/19/2012     ASSESSMENT AND PLAN:   1.  Issue:  Distal left lower extremity DVT since 04/2012.  2.  Status: family history of recurrent clots. 3.  Patient had decided to continue  lifelong Coumadin regardless of the result of her hypercoagulable work up.  Therefore, there is no reason to test for hypercoagulable state.  I agree with her decision for now.  In the future, if she has serious bleeding issue, we will readdress the pros and cons of continue indefinite anticoagulation.  4.  Today, INR was 4.  Therefore, HOLD Coumadin for 2 days, on Sunday 11/21/12, resume Coumadin.  Most likely, her new medication Prozac is increasing her INR.  5.   New dose:  6mg  Coumadin once every night; EXCEPT for 9mg  on Monday and Wednesday.  We discussed pros and cons of Xarelto.  There is slightly lower risk of serious bleeding with Xarelto compared to Coumadin.  However, when there is bleeding, there is no antidote for Xarelto.  [name] would like to continue Coumadin for now and readdress this issue when there is antidote for Xarelto.  6.  Follow up:  In about 6 months.  Coumadin pharmacist hopefully can see patient again in about 2 weeks for INR check.      The length of time of the face-to-face encounter was 25 minutes. More than 50% of time was spent counseling and coordination of care.

## 2012-11-19 NOTE — Progress Notes (Signed)
INR = 4 on 6 mg/day; 9 mg MWF Pt seen by Dr. Gaylyn Rong today.  He will continue Coumadin life long in this pt w/ + FH & 2 dtrs w/ PE.  Pt prefers to remain on Coumadin indefinitely. INR supratherapeutic.  MD changed dose as follows: Hold x 2 doses then decrease to 6 mg/day; 9 mg Mon/Wed. Return in 2 weeks. Marily Lente, Pharm.D.

## 2012-11-19 NOTE — Patient Instructions (Addendum)
1.  Issue:  Blood clot in leg.   2.  Status: family history of recurrent clots. 3.  Patient had decided to continue lifelong Coumadin.  Therefore, there is no reason to test for hypercoagulable state.  4.  Today, INR was 4.  Therefore, HOLD Coumadin for 2 days, on Sunday 11/21/12, resume Coumadin. 5.   New dose:  6mg  Coumadin once every night; EXCEPT for 9mg  on Monday and Wednesday.  6.  Follow up:  In about 6 months.  Coumadin pharmacist will contact you for return follow up in about 2 weeks.

## 2012-11-19 NOTE — Telephone Encounter (Signed)
s.w. pt and advised on Aug appt....pt ok and aware °

## 2012-12-07 ENCOUNTER — Ambulatory Visit (HOSPITAL_BASED_OUTPATIENT_CLINIC_OR_DEPARTMENT_OTHER): Payer: BC Managed Care – PPO | Admitting: Pharmacist

## 2012-12-07 ENCOUNTER — Other Ambulatory Visit (HOSPITAL_BASED_OUTPATIENT_CLINIC_OR_DEPARTMENT_OTHER): Payer: BC Managed Care – PPO | Admitting: Lab

## 2012-12-07 DIAGNOSIS — I824Z9 Acute embolism and thrombosis of unspecified deep veins of unspecified distal lower extremity: Secondary | ICD-10-CM

## 2012-12-07 DIAGNOSIS — I82409 Acute embolism and thrombosis of unspecified deep veins of unspecified lower extremity: Secondary | ICD-10-CM

## 2012-12-07 LAB — PROTIME-INR

## 2012-12-07 NOTE — Progress Notes (Signed)
INR therapeutic today (3.0) on 6mg  daily except 9mg  on MW. No changes in meds or diet.  Rcv'd steroid injection to hip for bursitis ~ 1 mo ago.  No current complaints.  Will continue current dose and recheck INR in 2 weeks to ensure it remains in therapeutic range.

## 2012-12-11 ENCOUNTER — Emergency Department (HOSPITAL_COMMUNITY)
Admission: EM | Admit: 2012-12-11 | Discharge: 2012-12-11 | Disposition: A | Discharge status: Home / Self Care | Payer: BC Managed Care – PPO | Attending: Emergency Medicine | Admitting: Emergency Medicine

## 2012-12-11 ENCOUNTER — Emergency Department (HOSPITAL_COMMUNITY): Payer: BC Managed Care – PPO

## 2012-12-11 DIAGNOSIS — R05 Cough: Secondary | ICD-10-CM

## 2012-12-11 DIAGNOSIS — Z8659 Personal history of other mental and behavioral disorders: Secondary | ICD-10-CM | POA: Insufficient documentation

## 2012-12-11 DIAGNOSIS — Z7901 Long term (current) use of anticoagulants: Secondary | ICD-10-CM | POA: Insufficient documentation

## 2012-12-11 DIAGNOSIS — R071 Chest pain on breathing: Secondary | ICD-10-CM | POA: Insufficient documentation

## 2012-12-11 DIAGNOSIS — F17200 Nicotine dependence, unspecified, uncomplicated: Secondary | ICD-10-CM | POA: Insufficient documentation

## 2012-12-11 DIAGNOSIS — Z79899 Other long term (current) drug therapy: Secondary | ICD-10-CM | POA: Insufficient documentation

## 2012-12-11 DIAGNOSIS — Z86718 Personal history of other venous thrombosis and embolism: Secondary | ICD-10-CM | POA: Insufficient documentation

## 2012-12-11 DIAGNOSIS — R059 Cough, unspecified: Secondary | ICD-10-CM | POA: Insufficient documentation

## 2012-12-11 LAB — CBC WITH DIFFERENTIAL/PLATELET
Basophils Absolute: 0 10*3/uL (ref 0.0–0.1)
HCT: 41.6 % (ref 36.0–46.0)
Lymphocytes Relative: 30 % (ref 12–46)
Monocytes Absolute: 0.6 10*3/uL (ref 0.1–1.0)
Neutro Abs: 4.2 10*3/uL (ref 1.7–7.7)
Platelets: 228 10*3/uL (ref 150–400)
RDW: 13.4 % (ref 11.5–15.5)
WBC: 7 10*3/uL (ref 4.0–10.5)

## 2012-12-11 LAB — BASIC METABOLIC PANEL
CO2: 24 mEq/L (ref 19–32)
Chloride: 102 mEq/L (ref 96–112)
Sodium: 136 mEq/L (ref 135–145)

## 2012-12-11 LAB — TROPONIN I: Troponin I: <0.3 ng/mL (ref <0.30)

## 2012-12-11 LAB — PROTIME-INR
INR: 2.75 — ABNORMAL HIGH (ref 0.00–1.49)
Prothrombin Time: 27.7 seconds — ABNORMAL HIGH (ref 11.6–15.2)

## 2012-12-11 MED ORDER — HYDROCOD POLST-CHLORPHEN POLST 10-8 MG/5ML PO LQCR: 5.0000 mL | Freq: Two times a day (BID) | ORAL | Status: DC

## 2012-12-11 MED ORDER — ONDANSETRON HCL 4 MG/2ML IJ SOLN: 4.0000 mg | Freq: Once | INTRAMUSCULAR | Status: DC

## 2012-12-11 MED ORDER — MORPHINE SULFATE 4 MG/ML IJ SOLN: 4.0000 mg | Freq: Once | INTRAMUSCULAR | Status: DC

## 2012-12-11 NOTE — ED Notes (Signed)
Pt driving did not receive Morphine or narcotics

## 2012-12-11 NOTE — ED Notes (Signed)
EKG has been done but Dr needs to see it.

## 2012-12-11 NOTE — ED Provider Notes (Signed)
History     CSN: [id number]  Arrival date & time 12/11/12  1705   First MD Initiated Contact with Patient 12/11/12 1729      No chief complaint on file.   (Consider location/radiation/quality/duration/timing/severity/associated sxs/prior treatment) HPI Comments: Pt states that she started with a cough about 5 days ago :pt states that last night she developed left chest and shoulder pain from coughing:pt states that the pain is worse with coughing and deep breathing:pt denies fever, n/v, palpitation:pt states that she only feels sob when she is actively coughing:pt states that she is on coumadin all the time and she is therapeutic in the last week:pt states that she has never had a pe:pt is a smoker:pt states that last night she had vigorous coughing and then the pain started   The history is provided by the patient. No language interpreter was used.    Past Medical History  Diagnosis Date  . Depression   . 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     Past Surgical History  Procedure Laterality Date  . Laser ablation      Family History  Problem Relation Age of Onset  . Pulmonary embolism Mother   . Cancer Mother 77    cervical cancer  . Deep vein thrombosis Father   . Deep vein thrombosis Paternal Aunt   . Deep vein thrombosis Paternal Uncle   . Deep vein thrombosis Daughter   . Pulmonary embolism Daughter   . Deep vein thrombosis Daughter     History  Substance Use Topics  . Smoking status: Current Every Day Smoker -- 1.00 packs/day for 20 years  . Smokeless tobacco: Not on file  . Alcohol Use: No    OB History   Grav Para Term Preterm Abortions TAB SAB Ect Mult Living                  Review of Systems  Constitutional: Negative.   Respiratory: Negative.   Cardiovascular: Negative.     Allergies  Shellfish allergy  Home Medications    Current Outpatient Rx  Name  Route  Sig  Dispense  Refill  . clonazePAM (KLONOPIN) 0.5 MG tablet   Oral   Take 1 tablet by mouth Twice daily as needed for anxiety.          Marland Kitchen dextromethorphan (DELSYM) 30 MG/5ML liquid   Oral   Take 60 mg by mouth as needed for cough.         Marland Kitchen FLUoxetine (PROZAC) 20 MG capsule   Oral   Take 20 mg by mouth at bedtime.          Marland Kitchen warfarin (COUMADIN) 6 MG tablet   Oral   Take 6-9 mg by mouth daily. Takes 1 tablet every day except Monday and Wednesday she takes 1 and 1/2         . EPINEPHrine (EPIPEN) 0.3 mg/0.3 mL DEVI   Intramuscular   Inject 0.3 mLs (0.3 mg total) into the muscle once.   2 Device   1     BP 128/76  Pulse 82  Temp(Src) 98.3 F (36.8 C) (Oral)  Resp 18  SpO2 96%  Physical Exam  Nursing note and vitals reviewed. Constitutional: She is oriented to person, place, and time. She appears well-developed and well-nourished.  HENT:  Head: Normocephalic and atraumatic.  Right Ear:  External ear normal.  Left Ear: External ear normal.  Eyes: Conjunctivae and EOM are normal.  Cardiovascular: Normal rate and regular rhythm.   Pulmonary/Chest: Effort normal and breath sounds normal.  Pt extremely tender to the left shoulder and chest  Abdominal: Soft. Bowel sounds are normal. There is no tenderness.  Musculoskeletal: Normal range of motion.  Neurological: She is alert and oriented to person, place, and time.  Skin: Skin is warm and dry.    ED Course  Procedures (including critical care time)  Labs Reviewed  PROTIME-INR - Abnormal; Notable for the following:    Prothrombin Time 27.7 (*)    INR 2.75 (*)    All other components within normal limits  CBC WITH DIFFERENTIAL  BASIC METABOLIC PANEL  TROPONIN I   Dg Chest 2 View  12/11/2012  *RADIOLOGY REPORT*  Clinical Data: Cough, chest pain  CHEST - 2 VIEW  Comparison: 04/11/2010  Findings: Lungs are clear. No pleural effusion or pneumothorax.  Cardiomediastinal  silhouette is within normal limits.  Visualized osseous structures are within normal limits.  IMPRESSION: No evidence of acute cardiopulmonary disease.   Original Report Authenticated By: Charline Bills, M.D.     Date: 12/11/2012  Rate: 70  Rhythm: normal sinus rhythm  QRS Axis: normal  Intervals: normal  ST/T Wave abnormalities: normal  Conduction Disutrbances:none  Narrative Interpretation:   Old EKG Reviewed: unchanged    1. Cough   2. Chest wall pain       MDM  Pt is okay to follow up as needed:inr is therapeutic considered pe although doubt:will send home on something for pain and the cough        Teressa Lower, NP 12/11/12 2015

## 2012-12-12 NOTE — ED Provider Notes (Signed)
Medical screening examination/treatment/procedure(s) were performed by non-physician practitioner and as supervising physician I was immediately available for consultation/collaboration.   Nalea Salce E Kammi Hechler, MD 12/12/12 1950 

## 2012-12-21 ENCOUNTER — Other Ambulatory Visit (HOSPITAL_BASED_OUTPATIENT_CLINIC_OR_DEPARTMENT_OTHER): Payer: BC Managed Care – PPO | Admitting: Lab

## 2012-12-21 ENCOUNTER — Ambulatory Visit (HOSPITAL_BASED_OUTPATIENT_CLINIC_OR_DEPARTMENT_OTHER): Payer: BC Managed Care – PPO | Admitting: Pharmacist

## 2012-12-21 DIAGNOSIS — I824Z2 Acute embolism and thrombosis of unspecified deep veins of left distal lower extremity: Secondary | ICD-10-CM

## 2012-12-21 LAB — PROTIME-INR
INR: 3.8 — ABNORMAL HIGH (ref 2.00–3.50)
Protime: 45.6 Seconds — ABNORMAL HIGH (ref 10.6–13.4)

## 2012-12-21 NOTE — Patient Instructions (Signed)
Hold coumadin today.  On 12/22/12, decrease coumadin to 6mg  daily except 9mg  on Mondays.  Recheck INR in 1 week on 12/28/12; lab at 8am and coumadin clinic at 8:15am.

## 2012-12-21 NOTE — Progress Notes (Signed)
INR above goal today. No problems to report regarding anticoagulation. Pt main complaint is her cough. She went to the ED on 12/11/12 for cough and chest discomfort/tightness. INR at ED on 12/11/12 = 2.75. She was given a prescription for Tussionex (no drug interaction noted with warfarin). She is out of her Tussionex and plans to see Dr. Yetta Barre and get another refill. She also plans to try Zyrtec as her cough is only at night when she is laying down. It may be post-nasal drainage. Hold coumadin today.  On 12/22/12, decrease coumadin to 6mg  daily except 9mg  on Mondays.  Recheck INR in 1 week on 12/28/12; lab at 8am and coumadin clinic at 8:15am.

## 2012-12-28 ENCOUNTER — Other Ambulatory Visit (HOSPITAL_BASED_OUTPATIENT_CLINIC_OR_DEPARTMENT_OTHER): Payer: BC Managed Care – PPO | Admitting: Lab

## 2012-12-28 ENCOUNTER — Ambulatory Visit (HOSPITAL_BASED_OUTPATIENT_CLINIC_OR_DEPARTMENT_OTHER): Payer: BC Managed Care – PPO | Admitting: Pharmacist

## 2012-12-28 DIAGNOSIS — Z7901 Long term (current) use of anticoagulants: Secondary | ICD-10-CM

## 2012-12-28 DIAGNOSIS — I824Z9 Acute embolism and thrombosis of unspecified deep veins of unspecified distal lower extremity: Secondary | ICD-10-CM

## 2012-12-28 NOTE — Progress Notes (Signed)
No changes to report.  Pt happy to be therapeutic again.   Continue coumadin to 6mg  daily except 9mg  on Mondays.  Recheck INR in 10 days on 01/06/13; lab at 8am and coumadin clinic at 8:15am.

## 2012-12-28 NOTE — Patient Instructions (Addendum)
Continue coumadin to 6mg  daily except 9mg  on Mondays. Recheck INR in 10 days on 01/06/13; lab at 8am and coumadin clinic at 8:15am.

## 2013-01-06 ENCOUNTER — Other Ambulatory Visit (HOSPITAL_BASED_OUTPATIENT_CLINIC_OR_DEPARTMENT_OTHER): Payer: BC Managed Care – PPO | Admitting: Lab

## 2013-01-06 ENCOUNTER — Ambulatory Visit (HOSPITAL_BASED_OUTPATIENT_CLINIC_OR_DEPARTMENT_OTHER): Payer: BC Managed Care – PPO | Admitting: Pharmacist

## 2013-01-06 DIAGNOSIS — I82629 Acute embolism and thrombosis of deep veins of unspecified upper extremity: Secondary | ICD-10-CM

## 2013-01-06 DIAGNOSIS — I82409 Acute embolism and thrombosis of unspecified deep veins of unspecified lower extremity: Secondary | ICD-10-CM

## 2013-01-06 DIAGNOSIS — I824Z9 Acute embolism and thrombosis of unspecified deep veins of unspecified distal lower extremity: Secondary | ICD-10-CM

## 2013-01-06 LAB — POCT INR: INR: 3.4

## 2013-01-06 LAB — PROTIME-INR
INR: 3.4 (ref 2.00–3.50)
Protime: 40.8 Seconds — ABNORMAL HIGH (ref 10.6–13.4)

## 2013-01-06 NOTE — Progress Notes (Signed)
Patient has no changes to report.  She is careful with her diet to avoid greens and mayo.  She continues to be "very stressed" at work.   She is supratherapeutic today at 3.4.  We will decrease her dose to 6 mg daily.  She will return in approx 10 days on April 9.

## 2013-01-06 NOTE — Patient Instructions (Signed)
Decrease to 6 mg daily.  We will recheck her coumadin in 10 days, April 9 at 8am.

## 2013-01-07 ENCOUNTER — Other Ambulatory Visit: Payer: Self-pay | Admitting: Oncology

## 2013-01-19 ENCOUNTER — Ambulatory Visit (HOSPITAL_BASED_OUTPATIENT_CLINIC_OR_DEPARTMENT_OTHER): Payer: BC Managed Care – PPO | Admitting: Pharmacist

## 2013-01-19 ENCOUNTER — Other Ambulatory Visit (HOSPITAL_BASED_OUTPATIENT_CLINIC_OR_DEPARTMENT_OTHER): Payer: BC Managed Care – PPO | Admitting: Lab

## 2013-01-19 DIAGNOSIS — I824Z9 Acute embolism and thrombosis of unspecified deep veins of unspecified distal lower extremity: Secondary | ICD-10-CM

## 2013-01-19 DIAGNOSIS — I82409 Acute embolism and thrombosis of unspecified deep veins of unspecified lower extremity: Secondary | ICD-10-CM

## 2013-01-19 LAB — PROTIME-INR: INR: 4.6 — ABNORMAL HIGH (ref 2.00–3.50)

## 2013-01-19 NOTE — Progress Notes (Signed)
INR = 4.6 on Coumadin 6 mg/day. C/O gums bleeding once on Monday when she brushed teeth.  Uses soft brush.  Needs to go to [facility]. She does drink EtOH (beer) nightly & more beer on the weekends than throughout the week. Only different med is Unisom (diphenhydramine)-- does not affect INR. Generally avoids green vegetables other than lima beans. INR supratherapeutic. Hold dose of Coumadin today then decrease dose to 6 mg/day; 3 mg Sat/Sun since she has more EtOH on the weekends. Repeat INR in 9 days (01/28/13). Ebony Hail, Pharm.D., CPP 01/19/2013@9 :11 AM

## 2013-01-28 ENCOUNTER — Ambulatory Visit (HOSPITAL_BASED_OUTPATIENT_CLINIC_OR_DEPARTMENT_OTHER): Payer: BC Managed Care – PPO | Admitting: Pharmacist

## 2013-01-28 ENCOUNTER — Other Ambulatory Visit (HOSPITAL_BASED_OUTPATIENT_CLINIC_OR_DEPARTMENT_OTHER): Payer: BC Managed Care – PPO | Admitting: Lab

## 2013-01-28 DIAGNOSIS — I824Z9 Acute embolism and thrombosis of unspecified deep veins of unspecified distal lower extremity: Secondary | ICD-10-CM

## 2013-01-28 DIAGNOSIS — I82409 Acute embolism and thrombosis of unspecified deep veins of unspecified lower extremity: Secondary | ICD-10-CM

## 2013-01-28 LAB — PROTIME-INR
INR: 1.3 — ABNORMAL LOW (ref 2.00–3.50)
Protime: 15.6 Seconds — ABNORMAL HIGH (ref 10.6–13.4)

## 2013-01-28 NOTE — Patient Instructions (Addendum)
INR now below goal at 1.3  Take 9 mg once this evening  Then change dose to 6 mg daily starting tomorrow 4/19  Return in 10 days on Tuesday 02/08/13  Have a great Easter!

## 2013-01-28 NOTE — Progress Notes (Signed)
Patient's INR now fluctuating down below goal at 1.3 (was > 4 previously). Patient's dose was decreased due to increased EtOH intake. This week patient states she drank EtOH over the weekend but tried to refrain from drinking during the week which could have also resulted in the lower INR. She reports no missed doses or leg/arm swelling/pain. No medication changes. She does mention gum bleeding still but she has switched toothbrushes to a softer option which has helped. Plan is to take 9 mg today x 1 and then increase dose back to 6 mg once daily and have patient return in 7-10 days. Appointment scheduled for 02/08/13 (tuesday) at 8am for lab and 8:15am for coumadin clinic.

## 2013-02-08 ENCOUNTER — Ambulatory Visit (HOSPITAL_BASED_OUTPATIENT_CLINIC_OR_DEPARTMENT_OTHER): Payer: BC Managed Care – PPO | Admitting: Pharmacist

## 2013-02-08 ENCOUNTER — Other Ambulatory Visit (HOSPITAL_BASED_OUTPATIENT_CLINIC_OR_DEPARTMENT_OTHER): Payer: BC Managed Care – PPO | Admitting: Lab

## 2013-02-08 DIAGNOSIS — I824Z9 Acute embolism and thrombosis of unspecified deep veins of unspecified distal lower extremity: Secondary | ICD-10-CM

## 2013-02-08 DIAGNOSIS — I82409 Acute embolism and thrombosis of unspecified deep veins of unspecified lower extremity: Secondary | ICD-10-CM

## 2013-02-08 LAB — PROTIME-INR
INR: 3.6 — ABNORMAL HIGH (ref 2.00–3.50)
Protime: 43.2 Seconds — ABNORMAL HIGH (ref 10.6–13.4)

## 2013-02-08 NOTE — Progress Notes (Signed)
INR above goal today.  Janet Hester has been having some spotting but nothing that has required her to wear a pad.  She has a sonohysterography planned on 03/03/13 with Dr. Arnette Schaumann at [facility].  Dr. Cliffton Asters is aware that Janet Hester is on coumadin & she does not need to hold her coumadin for this procedure.  Looking at past coumadin doses, Janet Hester has been averaging about 6mg  daily, so will continue this dose and check PT/INR in a couple weeks.

## 2013-02-10 ENCOUNTER — Other Ambulatory Visit (HOSPITAL_COMMUNITY): Payer: Self-pay | Admitting: Obstetrics and Gynecology

## 2013-02-10 DIAGNOSIS — Z1231 Encounter for screening mammogram for malignant neoplasm of breast: Secondary | ICD-10-CM

## 2013-02-17 ENCOUNTER — Ambulatory Visit (HOSPITAL_COMMUNITY): Payer: BC Managed Care – PPO

## 2013-03-01 ENCOUNTER — Ambulatory Visit (HOSPITAL_BASED_OUTPATIENT_CLINIC_OR_DEPARTMENT_OTHER): Payer: BC Managed Care – PPO | Admitting: Pharmacist

## 2013-03-01 ENCOUNTER — Other Ambulatory Visit (HOSPITAL_BASED_OUTPATIENT_CLINIC_OR_DEPARTMENT_OTHER): Payer: BC Managed Care – PPO | Admitting: Lab

## 2013-03-01 DIAGNOSIS — I82409 Acute embolism and thrombosis of unspecified deep veins of unspecified lower extremity: Secondary | ICD-10-CM

## 2013-03-01 DIAGNOSIS — I824Z9 Acute embolism and thrombosis of unspecified deep veins of unspecified distal lower extremity: Secondary | ICD-10-CM

## 2013-03-01 LAB — POCT INR: INR: 3.1

## 2013-03-01 LAB — PROTIME-INR
INR: 3.1 (ref 2.00–3.50)
Protime: 37.2 Seconds — ABNORMAL HIGH (ref 10.6–13.4)

## 2013-03-01 NOTE — Progress Notes (Signed)
INR close to goal of 2-3 on coumadin 6mg  daily.  Janet Hester still has some spotting.  She has a sonohysterography scheduled for 03/03/13.  I told Janet Hester to make sure her MD preforming this procedure is aware of INR today.  She did not need to be off Coumadin for the sonohysterography.  Will continue Coumadin 6mg  daily and check PT/INR in 1 month.  Janet Hester knows to call with any questions.

## 2013-03-28 ENCOUNTER — Other Ambulatory Visit: Payer: BC Managed Care – PPO | Admitting: Lab

## 2013-03-28 ENCOUNTER — Ambulatory Visit: Payer: BC Managed Care – PPO

## 2013-03-28 ENCOUNTER — Ambulatory Visit (HOSPITAL_BASED_OUTPATIENT_CLINIC_OR_DEPARTMENT_OTHER): Payer: BC Managed Care – PPO | Admitting: Pharmacist

## 2013-03-28 ENCOUNTER — Ambulatory Visit (HOSPITAL_BASED_OUTPATIENT_CLINIC_OR_DEPARTMENT_OTHER): Payer: BC Managed Care – PPO | Admitting: Lab

## 2013-03-28 DIAGNOSIS — I824Z9 Acute embolism and thrombosis of unspecified deep veins of unspecified distal lower extremity: Secondary | ICD-10-CM

## 2013-03-28 DIAGNOSIS — Z7901 Long term (current) use of anticoagulants: Secondary | ICD-10-CM

## 2013-03-28 LAB — POCT INR: INR: 2.3

## 2013-03-28 LAB — PROTIME-INR
INR: 2.3 (ref 2.00–3.50)
Protime: 27.6 Seconds — ABNORMAL HIGH (ref 10.6–13.4)

## 2013-03-28 NOTE — Progress Notes (Signed)
INR at goal today. Patient has been stable on 6 mg daily dose. Pt reports no complaints, no bruising or bleeding, and no missed doses. Pt says some days she does not feel like eating but usually has a regular appetite and diet. Instructed patient to continue same dose of 6 mg daily and the return in 1 month on 04/26/13 at 8am for lab and 8:15am for coumadin clinic. Patient knows to call coumadin clinic if she has any issues or concerns.

## 2013-03-28 NOTE — Patient Instructions (Addendum)
INR at goal today  No changes  Continue Coumadin 6mg  daily   Recheck INR on 04/26/13; lab at 8am and coumadin clinic at 8:15am.

## 2013-03-29 ENCOUNTER — Other Ambulatory Visit: Payer: BC Managed Care – PPO | Admitting: Lab

## 2013-03-29 ENCOUNTER — Ambulatory Visit: Payer: BC Managed Care – PPO

## 2013-04-04 ENCOUNTER — Other Ambulatory Visit (HOSPITAL_COMMUNITY): Payer: Self-pay | Admitting: Obstetrics and Gynecology

## 2013-04-04 DIAGNOSIS — Z1231 Encounter for screening mammogram for malignant neoplasm of breast: Secondary | ICD-10-CM

## 2013-04-19 ENCOUNTER — Ambulatory Visit (HOSPITAL_COMMUNITY)
Admission: RE | Admit: 2013-04-19 | Discharge: 2013-04-19 | Disposition: A | Discharge status: Home / Self Care | Payer: BC Managed Care – PPO | Source: Ambulatory Visit | Attending: Obstetrics and Gynecology | Admitting: Obstetrics and Gynecology

## 2013-04-19 DIAGNOSIS — Z1231 Encounter for screening mammogram for malignant neoplasm of breast: Secondary | ICD-10-CM | POA: Insufficient documentation

## 2013-04-26 ENCOUNTER — Ambulatory Visit (HOSPITAL_BASED_OUTPATIENT_CLINIC_OR_DEPARTMENT_OTHER): Payer: BC Managed Care – PPO | Admitting: Pharmacist

## 2013-04-26 ENCOUNTER — Other Ambulatory Visit: Payer: BC Managed Care – PPO | Admitting: Lab

## 2013-04-26 DIAGNOSIS — I82409 Acute embolism and thrombosis of unspecified deep veins of unspecified lower extremity: Secondary | ICD-10-CM

## 2013-04-26 DIAGNOSIS — I824Z9 Acute embolism and thrombosis of unspecified deep veins of unspecified distal lower extremity: Secondary | ICD-10-CM

## 2013-04-26 LAB — PROTIME-INR: Protime: 19.2 Seconds — ABNORMAL HIGH (ref 10.6–13.4)

## 2013-04-26 NOTE — Progress Notes (Signed)
INR slightly below goal today. One medication change: Pt started Gabapentin 100mg  QHS for night sweats related to hot flashes. No drug interaction noted. Should not cause a decrease in her INR. No missed coumadin doses. No other true changes in diet. Pt believes she may be eating more mayo lately as she loves chicken salad. No problems to report regarding anticoagulation. No s/s of clotting. Pt is leaving 7/16 and driving to [place]. She will be leaving on 7/16 and returning on  7/20. She is aware to stop and get out and walk around every 1-2 hours and to do leg exercises (stretching calf muscles, point toes toward the head then toward the floor, bend and straighten legs and toes, etc) while sitting in the car to improve leg circulation. Drink plenty of water. Pt should also wear her compression stockings. She is concerned about having a subtherapeutic INR while traveling. She believes her previous blood clots occurred while traveling to [place].  Last DVT in 04/2012. Pt feels more comfortable beginning Lovenox while traveling with subtherapeutic INR. Her daughter has given her the injections in the past and will be available to do the same on this trip.  Take 9mg  today. On 04/27/13, continue Coumadin 6mg  daily. Pt has been fairly stable on this dose since the end of April. Begin Lovenox 150mg  SQ daily while traveling to [place].  Pt will take the first dose a couple hours before she leaves.   Recheck INR on 05/03/13; lab at 8am and coumadin clinic at 8:15am.

## 2013-04-26 NOTE — Patient Instructions (Addendum)
Take 9mg  today. On 04/27/13, continue Coumadin 6mg  daily. Begin Lovenox 150mg  SQ daily while traveling to [place].  Take the first dose a couple hours before you leave.   Recheck INR on 05/03/13; lab at 8am and coumadin clinic at 8:15am. Wear your compression stockings while you are traveling. Get out of the car a walk/move around every 1-2 hours. Do leg exercises (stretching calf muscles, point toes toward the head then toward the floor, bend and straighten legs and toes, etc) while sitting in the car to improve leg circulation. Drink plenty of water.

## 2013-04-27 ENCOUNTER — Encounter: Payer: Self-pay | Admitting: Internal Medicine

## 2013-04-27 ENCOUNTER — Other Ambulatory Visit: Payer: BC Managed Care – PPO

## 2013-04-27 ENCOUNTER — Ambulatory Visit (INDEPENDENT_AMBULATORY_CARE_PROVIDER_SITE_OTHER): Payer: BC Managed Care – PPO | Admitting: Internal Medicine

## 2013-04-27 VITALS — BP 110/70 | HR 75 | Temp 98.7°F | Ht 72.0 in | Wt 218.2 lb

## 2013-04-27 DIAGNOSIS — Z789 Other specified health status: Secondary | ICD-10-CM

## 2013-04-27 NOTE — Patient Instructions (Signed)
Immunization Schedule, Adult  Influenza vaccine.  Adults should be given 1 dose every year.  Tetanus, diphtheria, and pertussis (Td, Tdap) vaccine.  Adults who have not previously been given Tdap or who do not know their vaccine status should be given 1 dose of Tdap.  Adults should have a Td booster every 10 years.  Doses should be given if needed to catch up on missed doses in the past.  Pregnant women should be given 1 dose of Tdap vaccine during each pregnancy.  Varicella vaccine.  All adults without evidence of immunity to varicella should receive 2 doses or a second dose if they have received only 1 dose.  Pregnant women who do not have evidence of immunity should be given the first dose after their pregnancy.  Human papillomavirus (HPV) vaccine.  Women aged 13 through 26 years who have not been given the vaccine previously should be given the 3 dose series. The second dose should be given 1 to 2 months after the first dose. The third dose should be given at least 24 weeks after the first dose.  The vaccine is not recommended for use in pregnant women. However, pregnancy testing is not needed before being given a dose. If a woman is found to be pregnant after being given a dose, no treatment is needed. In that case, the remaining doses should be delayed until after the pregnancy.  Men aged 13 through 21 years who have not been given the vaccine previously should be given the 3 dose series. Men aged 22 through 26 years may be given the 3 dose series. The second dose should be given 1 to 2 months after the first dose. The third dose should be given at least 24 weeks after the first dose.  Zoster vaccine.  One dose is recommended for adults aged 60 years and older unless certain conditions are present.  Measles, mumps, and rubella (MMR) vaccine.  Adults born before 1957 generally are considered immune to measles and mumps. Healthcare workers born before 1957 who do not have  evidence of immunity should consider vaccination.  Adults born in 1957 or later should have 1 or more doses of MMR vaccine unless there is a contraindication for the vaccine or they have evidence of immunity to the diseases. A second dose should be given at least 28 days after the first dose. Adults receiving certain types of previous vaccines should consider or be given vaccine doses.  For women of childbearing age, rubella immunity should be determined. If there is no evidence of immunity, women who are not pregnant should be vaccinated. If there is no evidence of immunity, women who are pregnant should delay vaccination until after their pregnancy.  Pneumococcal polysaccharide (PPSV23) vaccine.  All adults aged 65 years and older should be given 1 dose.  Adults younger than age 65 years who have certain medical conditions, who smoke cigarettes, who reside in nursing homes or long-term care facilities, or who have an unknown vaccination history should usually be given 1 or 2 doses of the vaccine.  Pneumococcal 13-valent conjugate (PVC13) vaccine.  Adults aged 19 years or older with certain medical conditions and an unknown or incomplete pneumococcal vaccination history should usually be given 1 dose of the vaccine. This dose may be in addition to a PPSV23 vaccine dose.  Meningococcal vaccine.  First-year college students up to age 21 years who are living in residence halls should be given a dose if they did not receive a dose on   or after their 16th birthday.  A dose should be given to microbiologists working with certain meningitis bacteria, military recruits, and people who travel to or live in countries with a high rate of meningitis.  One or 2 doses should be given to adults who have certain high-risk conditions.  Hepatitis A vaccine.  Adults who wish to be protected from this disease, who have certain high-risk conditions, who work with hepatitis A-infected animals, who work in  hepatitis A research labs, or who travel to or work in countries with a high rate of hepatitis A should be given the 2 dose series of the vaccine.  Adults who were previously unvaccinated and who anticipate close contact with an international adoptee during the first 60 days after arrival in the [place] from a country with a high rate of hepatitis A should be given the vaccine. The first dose of the 2 dose series should be given 2 or more weeks before the arrival of the adoptee.  Hepatitis B vaccine.  Adults who wish to be protected from this disease, who have certain high-risk conditions, who may be exposed to blood or other infectious body fluids, who are household contacts or sex partners of hepatitis B positive people, who are clients or workers in certain care facilities, or who travel to or work in countries with a high rate of hepatitis B should be given the 3 dose series of the vaccine. If you travel outside the [place], additional vaccines may be needed. The Centers for Disease Control and Prevention (CDC) provides information about the vaccines, medicines, and other measures necessary to prevent illness and injury during international travel. Visit the CDC website at www.cdc.gov/travel or call (800) CDC-INFO [800-232-4636]. You may also consult a travel clinic or your caregiver. Document Released: 12/20/2003 Document Revised: 12/22/2011 Document Reviewed: 11/14/2011 Exit[employer] Patient Information 2014 [employer].  

## 2013-04-27 NOTE — Progress Notes (Signed)
Subjective:    Patient ID: Janet Hester, female    DOB: 1966/02/03, 47 y.o.   MRN: [id number]  HPI  Pt presents to the clinic today requesting to get UTD on her immunizations as she prepares to attend [employer] in the fall. There are only 2 shots required. Tdap and MMR. She grew up in [place] and does not have access to her immunization records. She is wondering if she can just get revaccinated today.   Review of Systems  Past Medical History  Diagnosis Date  . Depression   . 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     Current Outpatient Prescriptions  Medication Sig Dispense Refill  . clonazePAM (KLONOPIN) 0.5 MG tablet Take 1 tablet by mouth Twice daily as needed for anxiety.       Marland Kitchen EPINEPHrine (EPIPEN) 0.3 mg/0.3 mL DEVI Inject 0.3 mLs (0.3 mg total) into the muscle once.  2 Device  1  . FLUoxetine (PROZAC) 20 MG capsule Take 20 mg by mouth at bedtime.       . gabapentin (NEURONTIN) 100 MG capsule Take 100 mg by mouth at bedtime. For hot flashes      . warfarin (COUMADIN) 6 MG tablet TAKE ONE TAB ONCE DAILY OR AS DIRECTED BY PHYSICIAN  30 tablet  5   No current facility-administered medications for this visit.    Allergies  Allergen Reactions  . Shellfish Allergy Anaphylaxis    Family History  Problem Relation Age of Onset  . Pulmonary embolism Mother   . Cancer Mother 62    cervical cancer  . Deep vein thrombosis Father   . Deep vein thrombosis Paternal Aunt   . Deep vein thrombosis Paternal Uncle   . Deep vein thrombosis Daughter   . Pulmonary embolism Daughter   . Deep vein thrombosis Daughter     History   Social History  . Marital Status: Single    Spouse Name: N/A    Number of Children: 3  . Years of Education: N/A   Occupational History  .      accounting   Social History Main Topics  . Smoking status: Current Every Day Smoker  -- 1.00 packs/day for 20 years  . Smokeless tobacco: Not on file  . Alcohol Use: No  . Drug Use: No  . Sexually Active: Not Currently   Other Topics Concern  . Not on file   Social History Narrative  . No narrative on file     Constitutional: Denies fever, malaise, fatigue, headache or abrupt weight changes.  Respiratory: Denies difficulty breathing, shortness of breath, cough or sputum production.   Cardiovascular: Denies chest pain, chest tightness, palpitations or swelling in the hands or feet.  Neurological: Denies dizziness, difficulty with memory, difficulty with speech or problems with balance and coordination.   No other specific complaints in a complete review of systems (except as listed in HPI above).     Objective:   Physical Exam   BP 110/70  Pulse 75  Temp(Src) 98.7 F (37.1 C) (Oral)  Ht 6' (1.829 m)  Wt 218 lb 4 oz (98.998 kg)  BMI 29.59 kg/m2  SpO2 97% Wt Readings from Last 3 Encounters:  04/27/13 218 lb 4 oz (98.998 kg)  11/19/12 220 lb 3.2 oz (99.882 kg)  10/27/12 220 lb (99.791 kg)    General: Appears her  stated age, well developed, well nourished in NAD.Marland Kitchen  Cardiovascular: Normal rate and rhythm. S1,S2 noted.  No murmur, rubs or gallops noted. No JVD or BLE edema. No carotid bruits noted. Pulmonary/Chest: Normal effort and positive vesicular breath sounds. No respiratory distress. No wheezes, rales or ronchi noted.  Neurological: Alert and oriented. Cranial nerves II-XII intact. Coordination normal. +DTRs bilaterally.   BMET    Component Value Date/Time   NA 136 12/11/2012 1924   K 4.0 12/11/2012 1924   CL 102 12/11/2012 1924   CO2 24 12/11/2012 1924   GLUCOSE 84 12/11/2012 1924   BUN 8 12/11/2012 1924   CREATININE 0.79 12/11/2012 1924   CALCIUM 9.0 12/11/2012 1924   GFRNONAA >90 12/11/2012 1924   GFRAA >90 12/11/2012 1924    Lipid Panel  No results found for this basename: chol, trig, hdl, cholhdl, vldl, ldlcalc    CBC    Component Value Date/Time    WBC 7.0 12/11/2012 1924   WBC 6.0 11/19/2012 0825   RBC 4.69 12/11/2012 1924   RBC 4.66 11/19/2012 0825   HGB 14.5 12/11/2012 1924   HGB 14.3 11/19/2012 0825   HCT 41.6 12/11/2012 1924   HCT 41.0 11/19/2012 0825   PLT 228 12/11/2012 1924   PLT 210 11/19/2012 0825   MCV 88.7 12/11/2012 1924   MCV 88.0 11/19/2012 0825   MCH 30.9 12/11/2012 1924   MCH 30.7 11/19/2012 0825   MCHC 34.9 12/11/2012 1924   MCHC 34.9 11/19/2012 0825   RDW 13.4 12/11/2012 1924   RDW 13.7 11/19/2012 0825   LYMPHSABS 2.1 12/11/2012 1924   LYMPHSABS 2.0 11/19/2012 0825   MONOABS 0.6 12/11/2012 1924   MONOABS 0.3 11/19/2012 0825   EOSABS 0.1 12/11/2012 1924   EOSABS 0.1 11/19/2012 0825   BASOSABS 0.0 12/11/2012 1924   BASOSABS 0.0 11/19/2012 0825    Hgb A1C No results found for this basename: HGBA1C        Assessment & Plan:   Immunization update:  Tdap given 07/19/2013- report printed and given to patient Will obtain serum lab test to assess immunity to MMR. If not immune with revaccinate  RTC as needed

## 2013-04-28 ENCOUNTER — Ambulatory Visit: Payer: BC Managed Care – PPO | Admitting: Internal Medicine

## 2013-04-28 ENCOUNTER — Other Ambulatory Visit: Payer: BC Managed Care – PPO | Admitting: Lab

## 2013-04-28 ENCOUNTER — Ambulatory Visit: Payer: BC Managed Care – PPO

## 2013-04-28 LAB — MEASLES/MUMPS/RUBELLA IMMUNITY: Rubeola IgG: >300 AU/mL — ABNORMAL HIGH (ref <25.00)

## 2013-05-03 ENCOUNTER — Ambulatory Visit (INDEPENDENT_AMBULATORY_CARE_PROVIDER_SITE_OTHER): Payer: BC Managed Care – PPO

## 2013-05-03 ENCOUNTER — Ambulatory Visit (HOSPITAL_BASED_OUTPATIENT_CLINIC_OR_DEPARTMENT_OTHER): Payer: BC Managed Care – PPO | Admitting: Pharmacist

## 2013-05-03 ENCOUNTER — Other Ambulatory Visit (HOSPITAL_BASED_OUTPATIENT_CLINIC_OR_DEPARTMENT_OTHER): Payer: BC Managed Care – PPO | Admitting: Lab

## 2013-05-03 DIAGNOSIS — I82409 Acute embolism and thrombosis of unspecified deep veins of unspecified lower extremity: Secondary | ICD-10-CM

## 2013-05-03 DIAGNOSIS — I824Z9 Acute embolism and thrombosis of unspecified deep veins of unspecified distal lower extremity: Secondary | ICD-10-CM

## 2013-05-03 DIAGNOSIS — Z23 Encounter for immunization: Secondary | ICD-10-CM

## 2013-05-03 LAB — POCT INR: INR: 2.2

## 2013-05-03 LAB — PROTIME-INR
INR: 2.2 (ref 2.00–3.50)
Protime: 26.4 Seconds — ABNORMAL HIGH (ref 10.6–13.4)

## 2013-05-03 NOTE — Progress Notes (Signed)
INR = 2.2 after taking 9 mg x 1 dose then 6 mg daily. She was on Lovenox during her trip to [place] as her INR had been subtherapeutic.  She took her last dose yesterday. Her abdomen is bruised she says. INR is therapeutic today.  Continue 6 mg daily. Return 05/19/13 when she will see Clenton Pare, NP same day. Ebony Hail, Pharm.D., CPP 05/03/2013@8 :27 AM

## 2013-05-19 ENCOUNTER — Ambulatory Visit: Payer: BC Managed Care – PPO | Admitting: Pharmacist

## 2013-05-19 ENCOUNTER — Ambulatory Visit (HOSPITAL_BASED_OUTPATIENT_CLINIC_OR_DEPARTMENT_OTHER): Payer: BC Managed Care – PPO | Admitting: Oncology

## 2013-05-19 ENCOUNTER — Ambulatory Visit (HOSPITAL_BASED_OUTPATIENT_CLINIC_OR_DEPARTMENT_OTHER): Payer: BC Managed Care – PPO | Admitting: Lab

## 2013-05-19 ENCOUNTER — Telehealth: Payer: Self-pay | Admitting: Oncology

## 2013-05-19 ENCOUNTER — Encounter: Payer: Self-pay | Admitting: Oncology

## 2013-05-19 VITALS — BP 129/69 | HR 76 | Temp 98.2°F | Resp 20 | Ht 72.0 in | Wt 219.5 lb

## 2013-05-19 DIAGNOSIS — I824Z9 Acute embolism and thrombosis of unspecified deep veins of unspecified distal lower extremity: Secondary | ICD-10-CM

## 2013-05-19 DIAGNOSIS — Z7901 Long term (current) use of anticoagulants: Secondary | ICD-10-CM

## 2013-05-19 LAB — COMPREHENSIVE METABOLIC PANEL (CC13)
Albumin: 3.6 g/dL (ref 3.5–5.0)
BUN: 11.4 mg/dL (ref 7.0–26.0)
CO2: 24 mEq/L (ref 22–29)
Calcium: 9.6 mg/dL (ref 8.4–10.4)
Chloride: 107 mEq/L (ref 98–109)
Creatinine: 0.8 mg/dL (ref 0.6–1.1)

## 2013-05-19 LAB — CBC WITH DIFFERENTIAL/PLATELET
Basophils Absolute: 0.1 10*3/uL (ref 0.0–0.1)
EOS%: 1.7 % (ref 0.0–7.0)
HCT: 39.7 % (ref 34.8–46.6)
HGB: 13.6 g/dL (ref 11.6–15.9)
LYMPH%: 32.9 % (ref 14.0–49.7)
MCH: 30.8 pg (ref 25.1–34.0)
MCV: 89.9 fL (ref 79.5–101.0)
MONO%: 7.5 % (ref 0.0–14.0)
NEUT%: 57 % (ref 38.4–76.8)
Platelets: 238 10*3/uL (ref 145–400)

## 2013-05-19 LAB — POCT INR: INR: 2.6

## 2013-05-19 NOTE — Telephone Encounter (Signed)
gv and pritned appt sched and avs fo rpt. °

## 2013-05-19 NOTE — Progress Notes (Signed)
[facility]  Telephone:([phone]) [phone] Fax:([phone]) [phone]   OFFICE PROGRESS NOTE   Cc:  Sanda Linger, MD  DIAGNOSIS:  Left distal lower extremity DVT  CURRENT THERAPY:  Coumadin with goal INR 2-3 since 04/2012  INTERVAL HISTORY: Janet Hester 47 y.o. female returns for regular follow up.  She has been on Coumadin since 04/2012 without complication.  Her INR has been regularly therapeutic.  Swelling has completely resolved.  She denied any bleeding, chest pain, palpitation, SOB, syncope.  She is undergoing perimenopause with hot flash/night sweats. Take gabapentin with improvement.  The rest of the 14-point review of system was negative.   She is currently taking Coumadin 6 mg daily.  Family history notable for extensive family history of DVT, PE in 2 daughters, father, paternal aunts, uncles, and cousins.  She would like to continue Coumadin indefinitely and does not mind INR check.     Past Medical History  Diagnosis Date  . Depression   . 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     Past Surgical History  Procedure Laterality Date  . Laser ablation      Current Outpatient Prescriptions  Medication Sig Dispense Refill  . clonazePAM (KLONOPIN) 0.5 MG tablet Take 1 tablet by mouth Twice daily as needed for anxiety.       Marland Kitchen EPINEPHrine (EPIPEN) 0.3 mg/0.3 mL DEVI Inject 0.3 mLs (0.3 mg total) into the muscle once.  2 Device  1  . FLUoxetine (PROZAC) 20 MG capsule Take 20 mg by mouth at bedtime.       . gabapentin (NEURONTIN) 100 MG capsule Take 100 mg by mouth at bedtime. For hot flashes      . warfarin (COUMADIN) 6 MG tablet TAKE ONE TAB ONCE DAILY OR AS DIRECTED BY PHYSICIAN  30 tablet  5   No current facility-administered medications for this visit.    ALLERGIES:  is allergic to shellfish allergy.  REVIEW OF SYSTEMS:  The rest of the 14-point  review of system was negative.   Filed Vitals:   05/19/13 0846  BP: 129/69  Pulse: 76  Temp: 98.2 F (36.8 C)  Resp: 20   Wt Readings from Last 3 Encounters:  05/19/13 219 lb 8 oz (99.565 kg)  04/27/13 218 lb 4 oz (98.998 kg)  11/19/12 220 lb 3.2 oz (99.882 kg)   ECOG Performance status: 0  PHYSICAL EXAMINATION:   General:  well-nourished woman, in no acute distress.  Eyes:  no scleral icterus.  ENT:  There were no oropharyngeal lesions.  Neck was without thyromegaly.  Lymphatics:  Negative cervical, supraclavicular or axillary adenopathy.  Respiratory: lungs were clear bilaterally without wheezing or crackles.  Cardiovascular:  Regular rate and rhythm, S1/S2, without murmur, rub or gallop.  There was no pedal edema.  GI:  abdomen was soft, flat, nontender, nondistended, without organomegaly.  Muscoloskeletal:  no spinal tenderness of palpation of vertebral spine.  Skin exam was without echymosis, petichae.  Neuro exam was nonfocal.  Patient was able to get on and off exam table without assistance.  Gait was normal.  Patient was alerted and oriented.  Attention was good.   Language was appropriate.  Mood was normal without depression.  Speech was not pressured.  Thought content was not tangential.      LABORATORY/RADIOLOGY DATA:  Lab Results  Component Value Date  WBC 6.1 05/19/2013   HGB 13.6 05/19/2013   HCT 39.7 05/19/2013   PLT 238 05/19/2013   GLUCOSE 94 05/19/2013   ALKPHOS 48 05/19/2013   ALT 20 05/19/2013   AST 23 05/19/2013   NA 139 05/19/2013   K 4.0 05/19/2013   CL 102 12/11/2012   CREATININE 0.8 05/19/2013   BUN 11.4 05/19/2013   CO2 24 05/19/2013   INR 2.6 05/19/2013     ASSESSMENT AND PLAN:   1.  Issue:  Distal left lower extremity DVT since 04/2012.  2.  Status: family history of recurrent clots. 3.  Patient had decided to continue lifelong Coumadin regardless of the result of her hypercoagulable work up.  Therefore, there is no reason to test for hypercoagulable state.  I agree  with her decision for now.  In the future, if she has serious bleeding issue, we will readdress the pros and cons of continue indefinite anticoagulation.  4.  Today, INR was 2.6.  Continue Coumadin 6 mg daily. We discussed pros and cons of Xarelto.  There is slightly lower risk of serious bleeding with Xarelto compared to Coumadin.  However, when there is bleeding, there is no antidote for Xarelto.  [name] would like to continue Coumadin for now and readdress this issue when there is antidote for Xarelto.  6.  Follow up:  In about 6 months.  She will see the Coumadin clinic in the interim.    The length of time of the face-to-face encounter was 15 minutes. More than 50% of time was spent counseling and coordination of care.

## 2013-05-19 NOTE — Progress Notes (Signed)
Pt seen prior to seeing Kristin INR=2.6 on 6mg  daily No changes to report No new meds, diet consistent Still bruised from taking lovenox while traveling mid-July Refills not needed at this visit. Continue Coumadin 6mg  daily. Recheck INR on 06/21/13; lab at 8:00 am, coumadin clinic at 8:15 am.

## 2013-05-19 NOTE — Patient Instructions (Signed)
Continue Coumadin 6mg  daily. Recheck INR on 06/21/13; lab at 8:00 am, coumadin clinic at 8:15 am.

## 2013-06-21 ENCOUNTER — Ambulatory Visit (HOSPITAL_BASED_OUTPATIENT_CLINIC_OR_DEPARTMENT_OTHER): Payer: Self-pay | Admitting: Pharmacist

## 2013-06-21 ENCOUNTER — Other Ambulatory Visit (HOSPITAL_BASED_OUTPATIENT_CLINIC_OR_DEPARTMENT_OTHER): Payer: BC Managed Care – PPO | Admitting: Lab

## 2013-06-21 DIAGNOSIS — I824Z9 Acute embolism and thrombosis of unspecified deep veins of unspecified distal lower extremity: Secondary | ICD-10-CM

## 2013-06-21 LAB — PROTIME-INR: INR: 2.2 (ref 2.00–3.50)

## 2013-06-21 NOTE — Progress Notes (Signed)
INR = 2.2 on 6 mg daily No problems to report re: anticoag.  Doing well. No recent med changes & no missed doses of Coumadin. INR at goal.  No dose change needed. Return in 5 weeks. Ebony Hail, Pharm.D., CPP 06/21/2013@8 :55 AM

## 2013-07-15 ENCOUNTER — Other Ambulatory Visit: Payer: Self-pay | Admitting: Oncology

## 2013-07-15 DIAGNOSIS — I824Z9 Acute embolism and thrombosis of unspecified deep veins of unspecified distal lower extremity: Secondary | ICD-10-CM

## 2013-07-18 ENCOUNTER — Encounter (HOSPITAL_COMMUNITY): Payer: Self-pay

## 2013-07-18 ENCOUNTER — Emergency Department (HOSPITAL_COMMUNITY)
Admission: EM | Admit: 2013-07-18 | Discharge: 2013-07-18 | Disposition: A | Discharge status: Home / Self Care | Payer: BC Managed Care – PPO | Attending: Emergency Medicine | Admitting: Emergency Medicine

## 2013-07-18 DIAGNOSIS — F17200 Nicotine dependence, unspecified, uncomplicated: Secondary | ICD-10-CM | POA: Insufficient documentation

## 2013-07-18 DIAGNOSIS — M7070 Other bursitis of hip, unspecified hip: Secondary | ICD-10-CM

## 2013-07-18 DIAGNOSIS — Z79899 Other long term (current) drug therapy: Secondary | ICD-10-CM | POA: Insufficient documentation

## 2013-07-18 DIAGNOSIS — Z86718 Personal history of other venous thrombosis and embolism: Secondary | ICD-10-CM | POA: Insufficient documentation

## 2013-07-18 DIAGNOSIS — M76899 Other specified enthesopathies of unspecified lower limb, excluding foot: Secondary | ICD-10-CM | POA: Insufficient documentation

## 2013-07-18 DIAGNOSIS — Z862 Personal history of diseases of the blood and blood-forming organs and certain disorders involving the immune mechanism: Secondary | ICD-10-CM | POA: Insufficient documentation

## 2013-07-18 DIAGNOSIS — Z7901 Long term (current) use of anticoagulants: Secondary | ICD-10-CM | POA: Insufficient documentation

## 2013-07-18 DIAGNOSIS — M129 Arthropathy, unspecified: Secondary | ICD-10-CM | POA: Insufficient documentation

## 2013-07-18 DIAGNOSIS — F329 Major depressive disorder, single episode, unspecified: Secondary | ICD-10-CM | POA: Insufficient documentation

## 2013-07-18 DIAGNOSIS — F3289 Other specified depressive episodes: Secondary | ICD-10-CM | POA: Insufficient documentation

## 2013-07-18 HISTORY — DX: Bursopathy, unspecified: M71.9

## 2013-07-18 MED ORDER — OXYCODONE-ACETAMINOPHEN 5-325 MG PO TABS
2.0000 | ORAL_TABLET | Freq: Once | ORAL | Status: AC
  Administered 2013-07-18: 2 via ORAL
  Filled 2013-07-18: qty 2

## 2013-07-18 MED ORDER — OXYCODONE-ACETAMINOPHEN 5-325 MG PO TABS: 2.0000 | ORAL_TABLET | ORAL | Status: DC | PRN

## 2013-07-18 NOTE — ED Provider Notes (Signed)
CSN: [id number]     Arrival date & time 07/18/13  0935 History   First MD Initiated Contact with Patient 07/18/13 [phone]     Chief Complaint  Patient presents with  . Hip Pain   (Consider location/radiation/quality/duration/timing/severity/associated sxs/prior Treatment) The history is provided by the patient.  Janet Hester is a 47 y.o. female hx of DVT on coumadin, bursitis here with bilateral hip pain. Start with left hip pain 3 days ago. It radiates to the right hip. Denies any fevers or chills. Has some tingling in her feet but denies any weakness or numbness. He said this was similar to her previous bursitis. She has an appointment with orthopedic doctor tomorrow but is here because she has more pain. She is requesting to get a injection of her hips.    Past Medical History  Diagnosis Date  . Depression   . 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   . Arthritis   . Bursitis    Past Surgical History  Procedure Laterality Date  . Laser ablation     Family History  Problem Relation Age of Onset  . Pulmonary embolism Mother   . Cancer Mother 52    cervical cancer  . Deep vein thrombosis Father   . Deep vein thrombosis Paternal Aunt   . Deep vein thrombosis Paternal Uncle   . Deep vein thrombosis Daughter   . Pulmonary embolism Daughter   . Deep vein thrombosis Daughter    History  Substance Use Topics  . Smoking status: Current Every Day Smoker -- 1.00 packs/day for 20 years  . Smokeless tobacco: Never Used  . Alcohol Use: No   OB History   Grav Para Term Preterm Abortions TAB SAB Ect Mult Living                 Review of Systems  Musculoskeletal:       Bilateral hip pain   All other systems reviewed and are negative.    Allergies  Shellfish allergy  Home Medications   Current Outpatient Rx  Name  Route  Sig  Dispense  Refill  . acetaminophen  (TYLENOL) 500 MG tablet   Oral   Take 500 mg by mouth every 6 (six) hours as needed for pain.         . clonazePAM (KLONOPIN) 0.5 MG tablet   Oral   Take 1 tablet by mouth Twice daily as needed for anxiety.          Marland Kitchen FLUoxetine (PROZAC) 20 MG capsule   Oral   Take 20 mg by mouth at bedtime.          . gabapentin (NEURONTIN) 100 MG capsule   Oral   Take 100 mg by mouth at bedtime. For hot flashes         . warfarin (COUMADIN) 6 MG tablet      TAKE ONE TAB ONCE DAILY OR AS DIRECTED BY PHYSICIAN   30 tablet   5   . EPINEPHrine (EPIPEN) 0.3 mg/0.3 mL DEVI   Intramuscular   Inject 0.3 mLs (0.3 mg total) into the muscle once.   2 Device   1    BP 129/81  Pulse 74  Temp(Src) 98.8 F (37.1 C) (Oral)  Resp 18  Ht 5' 11.75" (1.822 m)  Wt 218 lb (98.884 kg)  BMI 29.79 kg/m2  SpO2 95% Physical Exam  Nursing note and vitals reviewed. Constitutional: She is oriented to person, place, and time.  Uncomfortable   HENT:  Head: Normocephalic.  Mouth/Throat: Oropharynx is clear and moist.  Eyes: Conjunctivae are normal. Pupils are equal, round, and reactive to light.  Neck: Normal range of motion. Neck supple.  Cardiovascular: Normal rate, regular rhythm and normal heart sounds.   Pulmonary/Chest: Effort normal.  Abdominal: Soft.  Musculoskeletal:  L hip slightly dec ROM. Neg straight leg raise bilaterally. 2+ pulses, nl sensation, nl strength.   Neurological: She is alert and oriented to person, place, and time.  Skin: Skin is warm and dry.  Psychiatric: She has a normal mood and affect. Judgment and thought content normal.    ED Course  Procedures (including critical care time) Labs Review Labs Reviewed - No data to display Imaging Review No results found.  MDM  No diagnosis found. Janet Hester is a 47 y.o. female here with hip pain. Likely bursitis. No signs of infected joint. I told her that I do not do joint injections. Gave her some percocet and felt  better and she will see her orthopedic doctor tomorrow.     Richardean Canal, MD 07/18/13 1046

## 2013-07-18 NOTE — ED Notes (Signed)
No international travel nor has she been around anyone who has.

## 2013-07-18 NOTE — ED Notes (Signed)
Patient c/o bilateral hip pain x 3 days ago. Patient states she called the orthopedic physician, but could not be seen today. Patient has a history of bursitis

## 2013-07-26 ENCOUNTER — Other Ambulatory Visit: Payer: Self-pay | Admitting: Hematology and Oncology

## 2013-07-26 ENCOUNTER — Ambulatory Visit: Payer: BC Managed Care – PPO | Admitting: Lab

## 2013-07-26 ENCOUNTER — Ambulatory Visit (HOSPITAL_BASED_OUTPATIENT_CLINIC_OR_DEPARTMENT_OTHER): Payer: Self-pay | Admitting: Pharmacist

## 2013-07-26 DIAGNOSIS — I824Z9 Acute embolism and thrombosis of unspecified deep veins of unspecified distal lower extremity: Secondary | ICD-10-CM

## 2013-07-26 DIAGNOSIS — I82721 Chronic embolism and thrombosis of deep veins of right upper extremity: Secondary | ICD-10-CM

## 2013-07-26 LAB — POCT INR: INR: 3

## 2013-07-26 NOTE — Patient Instructions (Signed)
Continue Coumadin 6mg  daily. Recheck INR on 08/30/13; lab at 8:00 am, coumadin clinic at 8:15 am

## 2013-07-26 NOTE — Progress Notes (Signed)
Pt seen in clinic today INR=3.0 on 6mg  daily Pt states she had heavy spotting for 3 days last week. She states she usually does not get monthly cycles since she had an ablation in 2005. Pt is also aware she is going through "the change" Her therapist is switching her from Prozac to Pristiq (no interactions with coumadin found) Informed patient to call us if any unusual bleeding occurs again or if it gets heavy. Continue Coumadin 6mg  daily. Recheck INR on 08/30/13; lab at 8:00 am, coumadin clinic at 8:15 am

## 2013-08-30 ENCOUNTER — Other Ambulatory Visit (HOSPITAL_BASED_OUTPATIENT_CLINIC_OR_DEPARTMENT_OTHER): Payer: BC Managed Care – PPO | Admitting: Lab

## 2013-08-30 ENCOUNTER — Encounter (INDEPENDENT_AMBULATORY_CARE_PROVIDER_SITE_OTHER): Payer: Self-pay

## 2013-08-30 ENCOUNTER — Ambulatory Visit (HOSPITAL_BASED_OUTPATIENT_CLINIC_OR_DEPARTMENT_OTHER): Payer: BC Managed Care – PPO | Admitting: Pharmacist

## 2013-08-30 DIAGNOSIS — I82721 Chronic embolism and thrombosis of deep veins of right upper extremity: Secondary | ICD-10-CM

## 2013-08-30 DIAGNOSIS — Z7901 Long term (current) use of anticoagulants: Secondary | ICD-10-CM

## 2013-08-30 DIAGNOSIS — I824Z9 Acute embolism and thrombosis of unspecified deep veins of unspecified distal lower extremity: Secondary | ICD-10-CM

## 2013-08-30 LAB — PROTIME-INR: Protime: 50.4 Seconds — ABNORMAL HIGH (ref 10.6–13.4)

## 2013-08-30 LAB — POCT INR: INR: 4.2

## 2013-08-30 NOTE — Progress Notes (Signed)
INR above goal today Pt is doing well with no complaints other than stress from work/school No reports of unusual bleeding or bruising No extra doses or missed doses No diet changes Pt did recently switch to pristiq from prozac which has a small chance of increasing the effectiveness of coumadin  This could be causing her INR to trend up Otherwise patient has been stable on current dose of 6 mg daily. Will make slight dose decrease today Plan: No coumadin today.  Starting tomorrow start Coumadin 6mg  daily except for 3 mg (1/2 tablet) on Wednesdays and Fridays . Recheck INR in ~2 weeks on 09/20/13; lab at 8:00 am, coumadin clinic at 8:15 am.

## 2013-08-30 NOTE — Patient Instructions (Addendum)
INR above goal No coumadin today.  Starting tomorrow start Coumadin 6mg  daily except for 3 mg (1/2 tablet) on Wednesdays and Fridays . Recheck INR on 09/20/13; lab at 8:00 am, coumadin clinic at 8:15 am.

## 2013-09-20 ENCOUNTER — Other Ambulatory Visit (HOSPITAL_BASED_OUTPATIENT_CLINIC_OR_DEPARTMENT_OTHER): Payer: BC Managed Care – PPO | Admitting: Lab

## 2013-09-20 ENCOUNTER — Ambulatory Visit (HOSPITAL_BASED_OUTPATIENT_CLINIC_OR_DEPARTMENT_OTHER): Payer: BC Managed Care – PPO | Admitting: Pharmacist

## 2013-09-20 DIAGNOSIS — I82721 Chronic embolism and thrombosis of deep veins of right upper extremity: Secondary | ICD-10-CM

## 2013-09-20 DIAGNOSIS — I824Z9 Acute embolism and thrombosis of unspecified deep veins of unspecified distal lower extremity: Secondary | ICD-10-CM

## 2013-09-20 DIAGNOSIS — Z7901 Long term (current) use of anticoagulants: Secondary | ICD-10-CM

## 2013-09-20 LAB — PROTIME-INR
INR: 2.3 (ref 2.00–3.50)
Protime: 27.6 Seconds — ABNORMAL HIGH (ref 10.6–13.4)

## 2013-09-20 NOTE — Progress Notes (Signed)
Pt seen in clinic today INR=2.3 No changes to report. Enjoyed some collards for Thanksgiving Will continue slight decrease in weekly dose she started since last visit Decreased her weekly dose from 42mg  to 36 mg Continue Coumadin 6mg  daily except for 3 mg (1/2 tablet) on Wednesdays and Fridays . Recheck INR on 10/18/12; lab at 8:00 am, coumadin clinic at 8:15 am

## 2013-09-20 NOTE — Patient Instructions (Signed)
Continue Coumadin 6mg  daily except for 3 mg (1/2 tablet) on Wednesdays and Fridays . Recheck INR on 10/18/12; lab at 8:00 am, coumadin clinic at 8:15 am

## 2013-10-17 ENCOUNTER — Ambulatory Visit (INDEPENDENT_AMBULATORY_CARE_PROVIDER_SITE_OTHER): Payer: BC Managed Care – PPO | Admitting: Nurse Practitioner

## 2013-10-17 ENCOUNTER — Encounter: Payer: Self-pay | Admitting: *Deleted

## 2013-10-17 ENCOUNTER — Encounter: Payer: Self-pay | Admitting: Nurse Practitioner

## 2013-10-17 VITALS — BP 120/80 | HR 82 | Temp 98.4°F | Ht 72.0 in | Wt 234.5 lb

## 2013-10-17 DIAGNOSIS — R059 Cough, unspecified: Secondary | ICD-10-CM

## 2013-10-17 DIAGNOSIS — R05 Cough: Secondary | ICD-10-CM

## 2013-10-17 DIAGNOSIS — J069 Acute upper respiratory infection, unspecified: Secondary | ICD-10-CM

## 2013-10-17 MED ORDER — BENZONATATE 100 MG PO CAPS: ORAL_CAPSULE | ORAL | Status: DC

## 2013-10-17 NOTE — Progress Notes (Signed)
   Subjective:    Patient ID: Janet Hester, female    DOB: 31-Dec-1965, 48 y.o.   MRN: [id number]  Sinusitis This is a new problem. The current episode started in the past 7 days (3d). The problem has been gradually worsening since onset. There has been no fever. The pain is moderate (ha). Associated symptoms include chills, congestion, coughing, headaches, sinus pressure, sneezing and a sore throat. Pertinent negatives include no ear pain, hoarse voice, shortness of breath or swollen glands. Treatments tried: nyquil, tylenol. The treatment provided no relief.      Review of Systems  Constitutional: Positive for chills. Negative for fever and fatigue.  HENT: Positive for congestion, postnasal drip, sinus pressure, sneezing and sore throat. Negative for ear pain and hoarse voice.   Respiratory: Positive for cough. Negative for chest tightness, shortness of breath and wheezing.   Cardiovascular: Negative for chest pain.  Gastrointestinal: Negative for abdominal pain.  Musculoskeletal: Negative for back pain.  Neurological: Positive for headaches.       Objective:   Physical Exam  Vitals reviewed. Constitutional: She is oriented to person, place, and time. She appears well-developed and well-nourished. No distress.  Appears tired.  HENT:  Head: Normocephalic and atraumatic.  Right Ear: External ear normal.  Left Ear: External ear normal.  Mouth/Throat: Oropharynx is clear and moist. No oropharyngeal exudate.  R eye droop. Nasal quality to voice.  Neck: Normal range of motion. Neck supple. No thyromegaly present.  Cardiovascular: Normal rate, regular rhythm and normal heart sounds.   No murmur heard. Pulmonary/Chest: Effort normal and breath sounds normal. No respiratory distress. She has no wheezes.  Lymphadenopathy:    She has no cervical adenopathy (bilat anterior LAD, nt).  Neurological: She is alert and oriented to person, place, and time.  Skin: Skin is warm and dry.    Psychiatric: She has a normal mood and affect. Her behavior is normal. Thought content normal.          Assessment & Plan:  1. URI w/Cough See pt instructions. - benzonatate (TESSALON) 100 MG capsule; Take 1-2 capsules po up to 3 times daily PRN cough  Dispense: 60 capsule; Refill: 0

## 2013-10-17 NOTE — Patient Instructions (Signed)
You have a virus causing your symptoms. The average duration of cold symptoms is 14 days. Start daily sinus rinses (neilmed Sinus Rinse). Use 30 mg to 60 mg pseudoephedrine twice daily. Use benzonatate capsules if you are coughing at night. Sip fluids every hour. Rest. If you are not feeling better in 1 week or develop fever or chest pain, call us for re-evaluation. Feel better!  Upper Respiratory Infection, Adult An upper respiratory infection (URI) is also sometimes known as the common cold. The upper respiratory tract includes the nose, sinuses, throat, trachea, and bronchi. Bronchi are the airways leading to the lungs. Most people improve within 1 week, but symptoms can last up to 2 weeks. A residual cough may last even longer.  CAUSES Many different viruses can infect the tissues lining the upper respiratory tract. The tissues become irritated and inflamed and often become very moist. Mucus production is also common. A cold is contagious. You can easily spread the virus to others by oral contact. This includes kissing, sharing a glass, coughing, or sneezing. Touching your mouth or nose and then touching a surface, which is then touched by another person, can also spread the virus. SYMPTOMS  Symptoms typically develop 1 to 3 days after you come in contact with a cold virus. Symptoms vary from person to person. They may include:  Runny nose.  Sneezing.  Nasal congestion.  Sinus irritation.  Sore throat.  Loss of voice (laryngitis).  Cough.  Fatigue.  Muscle aches.  Loss of appetite.  Headache.  Low-grade fever. DIAGNOSIS  You might diagnose your own cold based on familiar symptoms, since most people get a cold 2 to 3 times a year. Your caregiver can confirm this based on your exam. Most importantly, your caregiver can check that your symptoms are not due to another disease such as strep throat, sinusitis, pneumonia, asthma, or epiglottitis. Blood tests, throat tests, and X-rays  are not necessary to diagnose a common cold, but they may sometimes be helpful in excluding other more serious diseases. Your caregiver will decide if any further tests are required. RISKS AND COMPLICATIONS  You may be at risk for a more severe case of the common cold if you smoke cigarettes, have chronic heart disease (such as heart failure) or lung disease (such as asthma), or if you have a weakened immune system. The very young and very old are also at risk for more serious infections. Bacterial sinusitis, middle ear infections, and bacterial pneumonia can complicate the common cold. The common cold can worsen asthma and chronic obstructive pulmonary disease (COPD). Sometimes, these complications can require emergency medical care and may be life-threatening. PREVENTION  The best way to protect against getting a cold is to practice good hygiene. Avoid oral or hand contact with people with cold symptoms. Wash your hands often if contact occurs. There is no clear evidence that vitamin C, vitamin E, echinacea, or exercise reduces the chance of developing a cold. However, it is always recommended to get plenty of rest and practice good nutrition. TREATMENT  Treatment is directed at relieving symptoms. There is no cure. Antibiotics are not effective, because the infection is caused by a virus, not by bacteria. Treatment may include:  Increased fluid intake. Sports drinks offer valuable electrolytes, sugars, and fluids.  Breathing heated mist or steam (vaporizer or shower).  Eating chicken soup or other clear broths, and maintaining good nutrition.  Getting plenty of rest.  Using gargles or lozenges for comfort.  Controlling fevers with ibuprofen  or acetaminophen as directed by your caregiver.  Increasing usage of your inhaler if you have asthma. Zinc gel and zinc lozenges, taken in the first 24 hours of the common cold, can shorten the duration and lessen the severity of symptoms. Pain medicines  may help with fever, muscle aches, and throat pain. A variety of non-prescription medicines are available to treat congestion and runny nose. Your caregiver can make recommendations and may suggest nasal or lung inhalers for other symptoms.  HOME CARE INSTRUCTIONS   Only take over-the-counter or prescription medicines for pain, discomfort, or fever as directed by your caregiver.  Use a warm mist humidifier or inhale steam from a shower to increase air moisture. This may keep secretions moist and make it easier to breathe.  Drink enough water and fluids to keep your urine clear or pale yellow.  Rest as needed.  Return to work when your temperature has returned to normal or as your caregiver advises. You may need to stay home longer to avoid infecting others. You can also use a face mask and careful hand washing to prevent spread of the virus. SEEK MEDICAL CARE IF:   After the first few days, you feel you are getting worse rather than better.  You need your caregiver's advice about medicines to control symptoms.  You develop chills, worsening shortness of breath, or brown or red sputum. These may be signs of pneumonia.  You develop yellow or brown nasal discharge or pain in the face, especially when you bend forward. These may be signs of sinusitis.  You develop a fever, swollen neck glands, pain with swallowing, or white areas in the back of your throat. These may be signs of strep throat. SEEK IMMEDIATE MEDICAL CARE IF:   You have a fever.  You develop severe or persistent headache, ear pain, sinus pain, or chest pain.  You develop wheezing, a prolonged cough, cough up blood, or have a change in your usual mucus (if you have chronic lung disease).  You develop sore muscles or a stiff neck. Document Released: 03/25/2001 Document Revised: 12/22/2011 Document Reviewed: 01/31/2011 [facility] Patient Information 2014 [place], [place].

## 2013-10-17 NOTE — Progress Notes (Signed)
Pre-visit discussion using our clinic review tool. No additional management support is needed unless otherwise documented below in the visit note.  

## 2013-10-18 ENCOUNTER — Other Ambulatory Visit (HOSPITAL_BASED_OUTPATIENT_CLINIC_OR_DEPARTMENT_OTHER): Payer: BC Managed Care – PPO

## 2013-10-18 ENCOUNTER — Ambulatory Visit (HOSPITAL_BASED_OUTPATIENT_CLINIC_OR_DEPARTMENT_OTHER): Payer: BC Managed Care – PPO | Admitting: Pharmacist

## 2013-10-18 DIAGNOSIS — I824Z2 Acute embolism and thrombosis of unspecified deep veins of left distal lower extremity: Secondary | ICD-10-CM

## 2013-10-18 DIAGNOSIS — Z7901 Long term (current) use of anticoagulants: Secondary | ICD-10-CM

## 2013-10-18 DIAGNOSIS — I82721 Chronic embolism and thrombosis of deep veins of right upper extremity: Secondary | ICD-10-CM

## 2013-10-18 DIAGNOSIS — I824Z9 Acute embolism and thrombosis of unspecified deep veins of unspecified distal lower extremity: Secondary | ICD-10-CM

## 2013-10-18 LAB — POCT INR: INR: 1.5

## 2013-10-18 LAB — PROTIME-INR
INR: 1.5 — AB (ref 2.00–3.50)
PROTIME: 18 s — AB (ref 10.6–13.4)

## 2013-10-18 NOTE — Patient Instructions (Signed)
Take 9mg  today.  On 10/19/13, continue Coumadin 6mg  daily except for 3 mg (1/2 tablet) on Wednesdays and Fridays .   Recheck INR in 1 week on 10/26/12; lab at 8:00 am & coumadin clinic at 8:15 am.

## 2013-10-18 NOTE — Progress Notes (Signed)
INR below goal today. No missed doses. Pt has been using Tylenol, Nyquil, Zyrtec and Sudafed for her cold/sinus symptoms. No cough, more sinus swelling, pressure and tenderness. Pt was seen for these symptoms on 10/17/13 and prescribed saline nasal sprays and Tessalon.  She is not taking the Tessalon. This illness is making her irritable and emotional. Pt wanted a prescription for Cipro and unfortunately I am not able to prescribe that for her. Cipro has worked well for her in the past. If she is put on Cipro, it may increase her INR. Pt aware. We discussed she may want to visit an urgent care or emergency room if her symptoms continue or get worse and she feels she needs another opinion. We discussed her symptoms and illness in detail. Provided written OTC options for pt to try; Mucinex, Claritin, Benadryl, teas with honey, plenty of water and rest. No concerns regarding anticoagulation. No s/s of clotting noted. Pt will take 9mg  today.  On 10/19/13, continue Coumadin 6mg  daily except for 3 mg (1/2 tablet) on Wednesdays and Fridays .   This one time increased dose has worked well in the past with similar INR (INR = 1.6 on 04/26/13). Recheck INR in 1 week on 10/26/12; lab at 8:00 am & coumadin clinic at 8:15 am. Will evaluate INR and adjust coumadin dose as necessary.

## 2013-10-26 ENCOUNTER — Ambulatory Visit: Payer: BC Managed Care – PPO

## 2013-10-26 ENCOUNTER — Other Ambulatory Visit: Payer: BC Managed Care – PPO

## 2013-10-26 ENCOUNTER — Telehealth: Payer: Self-pay | Admitting: Pharmacist

## 2013-10-26 NOTE — Telephone Encounter (Signed)
Patient FTKA today for lab and Coumadin clinic. Called and Left VM for patient to call back and reschedule

## 2013-10-27 ENCOUNTER — Encounter: Payer: BC Managed Care – PPO | Admitting: Internal Medicine

## 2013-11-08 ENCOUNTER — Telehealth: Payer: Self-pay | Admitting: Pharmacist

## 2013-11-18 ENCOUNTER — Encounter: Payer: Self-pay | Admitting: Hematology and Oncology

## 2013-11-18 ENCOUNTER — Ambulatory Visit (HOSPITAL_BASED_OUTPATIENT_CLINIC_OR_DEPARTMENT_OTHER): Payer: BC Managed Care – PPO | Admitting: Hematology and Oncology

## 2013-11-18 ENCOUNTER — Other Ambulatory Visit (HOSPITAL_BASED_OUTPATIENT_CLINIC_OR_DEPARTMENT_OTHER): Payer: BC Managed Care – PPO

## 2013-11-18 ENCOUNTER — Telehealth: Payer: Self-pay | Admitting: Hematology and Oncology

## 2013-11-18 ENCOUNTER — Ambulatory Visit: Payer: BC Managed Care – PPO | Admitting: Pharmacist

## 2013-11-18 VITALS — BP 147/89 | HR 90 | Temp 97.8°F | Resp 20 | Ht 72.0 in | Wt 235.4 lb

## 2013-11-18 DIAGNOSIS — I824Z9 Acute embolism and thrombosis of unspecified deep veins of unspecified distal lower extremity: Secondary | ICD-10-CM

## 2013-11-18 DIAGNOSIS — F17200 Nicotine dependence, unspecified, uncomplicated: Secondary | ICD-10-CM

## 2013-11-18 DIAGNOSIS — Z86718 Personal history of other venous thrombosis and embolism: Secondary | ICD-10-CM

## 2013-11-18 DIAGNOSIS — Z7901 Long term (current) use of anticoagulants: Secondary | ICD-10-CM

## 2013-11-18 DIAGNOSIS — Z832 Family history of diseases of the blood and blood-forming organs and certain disorders involving the immune mechanism: Secondary | ICD-10-CM

## 2013-11-18 LAB — CBC WITH DIFFERENTIAL/PLATELET
BASO%: 0.8 % (ref 0.0–2.0)
Basophils Absolute: 0.1 10*3/uL (ref 0.0–0.1)
EOS%: 1 % (ref 0.0–7.0)
Eosinophils Absolute: 0.1 10*3/uL (ref 0.0–0.5)
HEMATOCRIT: 39.5 % (ref 34.8–46.6)
HGB: 13.8 g/dL (ref 11.6–15.9)
LYMPH#: 1.7 10*3/uL (ref 0.9–3.3)
LYMPH%: 25.6 % (ref 14.0–49.7)
MCH: 32.1 pg (ref 25.1–34.0)
MCHC: 34.9 g/dL (ref 31.5–36.0)
MCV: 91.8 fL (ref 79.5–101.0)
MONO#: 0.5 10*3/uL (ref 0.1–0.9)
MONO%: 6.8 % (ref 0.0–14.0)
NEUT#: 4.5 10*3/uL (ref 1.5–6.5)
NEUT%: 65.8 % (ref 38.4–76.8)
Platelets: 255 10*3/uL (ref 145–400)
RBC: 4.3 10*6/uL (ref 3.70–5.45)
RDW: 13.4 % (ref 11.2–14.5)
WBC: 6.8 10*3/uL (ref 3.9–10.3)

## 2013-11-18 LAB — COMPREHENSIVE METABOLIC PANEL (CC13)
ALT: 17 U/L (ref 0–55)
AST: 22 U/L (ref 5–34)
Albumin: 4.1 g/dL (ref 3.5–5.0)
Alkaline Phosphatase: 45 U/L (ref 40–150)
Anion Gap: 9 mEq/L (ref 3–11)
BUN: 7.9 mg/dL (ref 7.0–26.0)
CHLORIDE: 108 meq/L (ref 98–109)
CO2: 21 mEq/L — ABNORMAL LOW (ref 22–29)
CREATININE: 0.8 mg/dL (ref 0.6–1.1)
Calcium: 9.3 mg/dL (ref 8.4–10.4)
Glucose: 97 mg/dl (ref 70–140)
Potassium: 3.8 mEq/L (ref 3.5–5.1)
Sodium: 138 mEq/L (ref 136–145)
Total Bilirubin: 0.69 mg/dL (ref 0.20–1.20)
Total Protein: 7.7 g/dL (ref 6.4–8.3)

## 2013-11-18 LAB — PROTIME-INR
INR: 2.5 (ref 2.00–3.50)
Protime: 30 Seconds — ABNORMAL HIGH (ref 10.6–13.4)

## 2013-11-18 LAB — POCT INR: INR: 2.5

## 2013-11-18 NOTE — Progress Notes (Signed)
[name]  Janet Calico, MD DIAGNOSIS:  Left lower extremity DVT  SUMMARY OF HEMATOLOGIC HISTORY: This is a pleasant 48 year old lady who developed an episode of left lower extremity DVT in July of 2013. The patient thought that the DVT was probably related to a long distance truck several days prior to the diagnosis. She woke up with tingling sensation and pain on the left leg. On 05/04/2012, ultrasound of the lower extremity confirmed acute thrombosis in a branch of the proximal popliteal vein, mid posterior tibial vein and peroneal veins. The patient was also a chronic smoker. She was anticoagulated with Coumadin since then without any bleeding complication. 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 The patient has made an informed decision not to pursue further the thrombophilia testing and is willing to take warfarin indefinitely for secondary prevention of recurrence of blood clots INTERVAL HISTORY: Janet Hester 48 y.o. female returns for further followup. She follows here at the Coumadin clinic regularly. The patient denies any recent signs or symptoms of bleeding such as spontaneous epistaxis, hematuria or hematochezia. Her age-appropriate preventive programs are up-to-date  I have reviewed the past medical history, past surgical history, social history and family history with the patient and they are unchanged from previous note.  ALLERGIES:  is allergic to shellfish allergy.  MEDICATIONS:  Current Outpatient Prescriptions  Medication Sig Dispense Refill  . acetaminophen (TYLENOL) 500 MG tablet Take 500 mg by mouth every 6 (six) hours as needed for pain.      . benzonatate (TESSALON) 100 MG capsule Take 1-2 capsules po up to 3 times daily PRN cough  60 capsule  0  . clonazePAM (KLONOPIN) 0.5 MG tablet Take 1 tablet by mouth Twice daily as needed for anxiety.       Marland Kitchen desvenlafaxine  (PRISTIQ) 100 MG 24 hr tablet Take 100 mg by mouth daily.      Marland Kitchen EPINEPHrine (EPIPEN) 0.3 mg/0.3 mL DEVI Inject 0.3 mLs (0.3 mg total) into the muscle once.  2 Device  1  . FLUoxetine (PROZAC) 40 MG capsule Take 40 mg by mouth daily.      Marland Kitchen gabapentin (NEURONTIN) 100 MG capsule Take 100 mg by mouth at bedtime. For hot flashes      . traZODone (DESYREL) 50 MG tablet Take 25 mg by mouth at bedtime.       Marland Kitchen warfarin (COUMADIN) 6 MG tablet TAKE ONE TAB ONCE DAILY OR AS DIRECTED BY PHYSICIAN  30 tablet  5   No current facility-administered medications for this visit.     REVIEW OF SYSTEMS:   Constitutional: Denies fevers, chills or night sweats Eyes: Denies blurriness of vision Ears, nose, mouth, throat, and face: Denies mucositis or sore throat Respiratory: Denies cough, dyspnea or wheezes Cardiovascular: Denies palpitation, chest discomfort or lower extremity swelling Gastrointestinal:  Denies nausea, heartburn or change in bowel habits Skin: Denies abnormal skin rashes Lymphatics: Denies new lymphadenopathy or easy bruising Neurological:Denies numbness, tingling or new weaknesses Behavioral/Psych: Mood is stable, no new changes  All other systems were reviewed with the patient and are negative.  PHYSICAL EXAMINATION: ECOG PERFORMANCE STATUS: 0 - Asymptomatic  Filed Vitals:   11/18/13 0843  BP: 147/89  Pulse: 90  Temp: 97.8 F (36.6 C)  Resp: 20   Filed Weights   11/18/13 0843  Weight: 235 lb 6.4 oz (106.777 kg)    GENERAL:alert, no distress and comfortable  SKIN: skin color, texture, turgor are normal, no rashes or significant lesions EYES: normal, Conjunctiva are pink and non-injected, sclera clear OROPHARYNX:no exudate, no erythema and lips, buccal mucosa, and tongue normal  NECK: supple, thyroid normal size, non-tender, without nodularity LYMPH:  no palpable lymphadenopathy in the cervical, axillary or inguinal LUNGS: clear to auscultation and percussion with normal  breathing effort HEART: regular rate & rhythm and no murmurs and no lower extremity edema ABDOMEN:abdomen soft, non-tender and normal bowel sounds Musculoskeletal:no cyanosis of digits and no clubbing  NEURO: alert & oriented x 3 with fluent speech, no focal motor/sensory deficits  LABORATORY DATA:  I have reviewed the data as listed Results for orders placed in visit on 11/18/13 (from the past 48 hour(s))  POCT INR     Status: None   Collection Time    11/18/13 12:00 AM      Result Value Range   INR 2.5      Lab Results  Component Value Date   WBC 6.8 11/18/2013   HGB 13.8 11/18/2013   HCT 39.5 11/18/2013   MCV 91.8 11/18/2013   PLT 255 11/18/2013   ASSESSMENT & PLAN:  #1 history of left lower extremity DVT #2 strong family history of blood clot in disorders The patient has decided to go on life long warfarin. She will continue followup at the warfarin clinic. She's doing well with no bleeding complications. #3 tobacco abuse I discussed with the patient importance of nicotine cessation and offer her additional help. The patient is not willing to quit right now #4 preventive care I discussed with her about influenza vaccination but the patient declined. All questions were answered. The patient knows to call the clinic with any problems, questions or concerns. No barriers to learning was detected.  I spent 15 minutes counseling the patient face to face. The total time spent in the appointment was 20 minutes and more than 50% was on counseling.     [place], [place], MD 11/18/2013 9:35 AM

## 2013-11-18 NOTE — Telephone Encounter (Signed)
sw. pt and advised on Feba ppt 2016...Marland Kitchenpt ok and aware

## 2013-11-18 NOTE — Telephone Encounter (Signed)
added cc appt

## 2013-11-18 NOTE — Progress Notes (Signed)
INR at goal on coumadin 6mg  daily but 3mg  on W/F.  Janet Hester stopped taking Pristiq and has started on Prozac 40mg .  There is a possible drug interaction that prozac can increase the levels of warfarin.  Janet Hester has been taking prozac for 2 weeks, so the effects of this drug interaction has probably evident by now.  No other unusual bleeding or bruising.  Will continue current coumadin dose and check PT/INR in 1 month.  Janet Hester has an appt with Dr. Alvy Bimler today.

## 2013-11-23 ENCOUNTER — Ambulatory Visit (INDEPENDENT_AMBULATORY_CARE_PROVIDER_SITE_OTHER): Payer: BC Managed Care – PPO | Admitting: Internal Medicine

## 2013-11-23 ENCOUNTER — Encounter: Payer: Self-pay | Admitting: Internal Medicine

## 2013-11-23 VITALS — BP 126/88 | HR 77 | Temp 97.8°F | Resp 16 | Ht 72.0 in | Wt 235.0 lb

## 2013-11-23 DIAGNOSIS — B001 Herpesviral vesicular dermatitis: Secondary | ICD-10-CM | POA: Insufficient documentation

## 2013-11-23 DIAGNOSIS — B009 Herpesviral infection, unspecified: Secondary | ICD-10-CM

## 2013-11-23 MED ORDER — VALACYCLOVIR HCL 1 G PO TABS: 1000.0000 mg | ORAL_TABLET | Freq: Three times a day (TID) | ORAL | Status: DC

## 2013-11-23 NOTE — Progress Notes (Signed)
Pre visit review using our clinic review tool, if applicable. No additional management support is needed unless otherwise documented below in the visit note. 

## 2013-11-23 NOTE — Patient Instructions (Signed)
Herpes Labialis  You have a fever blister or cold sore (herpes labialis). These painful, grouped sores are caused by one of the herpes viruses (HSV1 most commonly). They are usually found around the lips and mouth, but the same infection can also affect other areas on the face such as the nose and eyes. Herpes infections take about 10 days to heal. They often occur again and again in the same spot. Other symptoms may include numbness and tingling in the involved skin, achiness, fever, and swollen glands in the neck. Colds, emotional stress, injuries, or excess sunlight exposure all seem to make herpes reappear. Herpes lip infections are contagious. Direct contact with these sores can spread the infection. It can also be spread to other parts of your own body.  TREATMENT   Herpes labialis is usually self-limited and resolves within 1 week. To reduce pain and swelling, apply ice packs frequently to the sores or suck on popsicles or frozen juice bars. Antiviral medicine may be used by mouth to shorten the duration of the breakout. Avoid spreading the infection by washing your hands often. Be careful not to touch your eyes or genital areas after handling the infected blisters. Do not kiss or have other intimate contact with others. After the blisters are completely healed you may resume contact. Use sunscreen to lessen recurrences.   If this is your first infection with herpes, or if you have a severe or repeated infections, your caregiver may prescribe one of the anti-viral drugs to speed up the healing. If you have sun-related flare-ups despite the use of sunscreen, starting oral anti-viral medicine before a prolonged exposure (going skiing or to the beach) can prevent most episodes.   SEEK IMMEDIATE MEDICAL CARE IF:  · You develop a headache, sleepiness, high fever, vomiting, or severe weakness.  · You have eye irritation, pain, blurred vision or redness.  · You develop a prolonged infection not getting better in 10  days.  Document Released: 09/29/2005 Document Revised: 12/22/2011 Document Reviewed: 08/03/2009  [employer]® Patient Information ©2014 [employer].

## 2013-11-24 ENCOUNTER — Encounter: Payer: Self-pay | Admitting: Internal Medicine

## 2013-11-24 NOTE — Assessment & Plan Note (Signed)
Will treat with valtrex 

## 2013-11-24 NOTE — Progress Notes (Signed)
   Subjective:    Patient ID: Janet Hester, female    DOB: [birth year], 48 y.o.   MRN: [id number]  HPI Comments: She complains of 4 day history of tingling, burning, and painful blisters over her right upper lip.     Review of Systems  Constitutional: Negative.  Negative for fever, chills, diaphoresis, appetite change and fatigue.  HENT: Negative.   Eyes: Negative.   Respiratory: Negative.  Negative for cough, choking, chest tightness, shortness of breath and stridor.   Cardiovascular: Negative.  Negative for chest pain, palpitations and leg swelling.  Gastrointestinal: Negative.  Negative for abdominal pain.  Endocrine: Negative.   Genitourinary: Negative.   Musculoskeletal: Negative.   Skin: Negative.   Allergic/Immunologic: Negative.   Neurological: Negative.   Hematological: Negative.  Negative for adenopathy.  Psychiatric/Behavioral: Negative.        Objective:   Physical Exam  Vitals reviewed. Constitutional: She is oriented to person, place, and time. She appears well-developed and well-nourished. No distress.  HENT:  Head: Normocephalic and atraumatic.  Mouth/Throat: No oropharyngeal exudate.    Eyes: Conjunctivae are normal. Right eye exhibits no discharge. Left eye exhibits no discharge. No scleral icterus.  Neck: Normal range of motion. Neck supple. No JVD present. No tracheal deviation present. No thyromegaly present.  Cardiovascular: Normal rate, regular rhythm, normal heart sounds and intact distal pulses.  Exam reveals no gallop and no friction rub.   No murmur heard. Pulmonary/Chest: Effort normal and breath sounds normal. No stridor. No respiratory distress. She has no wheezes. She has no rales. She exhibits no tenderness.  Abdominal: Soft. Bowel sounds are normal. She exhibits no distension and no mass. There is no tenderness. There is no rebound and no guarding.  Musculoskeletal: Normal range of motion. She exhibits no edema and no tenderness.    Lymphadenopathy:    She has no cervical adenopathy.  Neurological: She is oriented to person, place, and time.  Skin: Skin is warm and dry. No rash noted. She is not diaphoretic. No erythema. No pallor.          Assessment & Plan:

## 2013-11-25 ENCOUNTER — Telehealth: Payer: Self-pay | Admitting: *Deleted

## 2013-11-25 NOTE — Telephone Encounter (Signed)
Patient phoned inquiring about shingles contagious info.  Read to her info from the CDC's webpage and advised stringent hand hygiene.

## 2013-12-21 ENCOUNTER — Other Ambulatory Visit: Payer: BC Managed Care – PPO

## 2013-12-21 ENCOUNTER — Ambulatory Visit (HOSPITAL_BASED_OUTPATIENT_CLINIC_OR_DEPARTMENT_OTHER): Payer: BC Managed Care – PPO | Admitting: Pharmacist

## 2013-12-21 DIAGNOSIS — Z7901 Long term (current) use of anticoagulants: Secondary | ICD-10-CM

## 2013-12-21 DIAGNOSIS — Z86718 Personal history of other venous thrombosis and embolism: Secondary | ICD-10-CM

## 2013-12-21 DIAGNOSIS — I824Z9 Acute embolism and thrombosis of unspecified deep veins of unspecified distal lower extremity: Secondary | ICD-10-CM

## 2013-12-21 DIAGNOSIS — I82721 Chronic embolism and thrombosis of deep veins of right upper extremity: Secondary | ICD-10-CM

## 2013-12-21 LAB — POCT INR: INR: 4.1

## 2013-12-21 LAB — PROTIME-INR
INR: 4.1 — AB (ref 2.00–3.50)
PROTIME: 49.2 s — AB (ref 10.6–13.4)

## 2013-12-21 NOTE — Patient Instructions (Signed)
Hold coumadin today (3/11) and tomorrow (3/12).   On 12/23/13, resume Coumadin 6mg  daily except for 3 mg (1/2 tablet) on Wednesdays and Fridays.  Recheck INR in 1 week on 12/29/13; lab at 8:15am & coumadin clinic at 8:30am.

## 2013-12-21 NOTE — Progress Notes (Signed)
INR above goal today. No problems to report. No concerns regarding anticoagulation. Pt completed a Valtrex prescription in Feb. She had a cortisone injection about 2 weeks ago. No extra doses of coumadin. Pt has had increased alcohol intake during the last week. She and her sister were mourning the anniversary of their mother's death and had consumed "many" shots of cognac. (Together they finished the bottle - a fifth). Hold coumadin today (3/11) and tomorrow (3/12).   On 12/23/13, resume usual coumadin dose of Coumadin 6mg  daily except for 3 mg (1/2 tablet) on Wednesdays and Fridays.  Recheck INR in 1 week on 12/29/13; lab at 8:15am & coumadin clinic at 8:30am. Pt knows to call us if she experiences any bleeding or unusual bruising before her next visit.

## 2013-12-29 ENCOUNTER — Other Ambulatory Visit (HOSPITAL_BASED_OUTPATIENT_CLINIC_OR_DEPARTMENT_OTHER): Payer: BC Managed Care – PPO

## 2013-12-29 ENCOUNTER — Ambulatory Visit (HOSPITAL_BASED_OUTPATIENT_CLINIC_OR_DEPARTMENT_OTHER): Payer: Self-pay | Admitting: Pharmacist

## 2013-12-29 DIAGNOSIS — I824Z9 Acute embolism and thrombosis of unspecified deep veins of unspecified distal lower extremity: Secondary | ICD-10-CM

## 2013-12-29 DIAGNOSIS — I82721 Chronic embolism and thrombosis of deep veins of right upper extremity: Secondary | ICD-10-CM

## 2013-12-29 LAB — PROTIME-INR
INR: 2.1 (ref 2.00–3.50)
Protime: 25.2 Seconds — ABNORMAL HIGH (ref 10.6–13.4)

## 2013-12-29 LAB — POCT INR: INR: 2.1

## 2013-12-29 NOTE — Progress Notes (Signed)
INR = 2.1  Goal 2-3 INR within goal range. No complications of anticoagulation noted. Patient states she had been "misbehaving" (i.e. Drinking cognac with family on the anniversary of her mother's death) but has resumed her normal diet and lifestyle. She had previously been stable at the current dose. She will continue Coumadin 6mg  daily except for 3 mg (1/2 tablet) on Wednesdays and Fridays. She will return to recheck INR in 4 weeks on 01/26/14; lab at 8:00am & coumadin clinic at 8:15am.  Theone Murdoch, PharmD

## 2014-01-17 ENCOUNTER — Other Ambulatory Visit: Payer: BC Managed Care – PPO | Admitting: Lab

## 2014-01-17 ENCOUNTER — Ambulatory Visit: Payer: BC Managed Care – PPO

## 2014-01-26 ENCOUNTER — Telehealth: Payer: Self-pay | Admitting: Pharmacist

## 2014-01-26 ENCOUNTER — Ambulatory Visit: Payer: BC Managed Care – PPO

## 2014-01-26 ENCOUNTER — Other Ambulatory Visit: Payer: BC Managed Care – PPO

## 2014-01-31 ENCOUNTER — Other Ambulatory Visit: Payer: Self-pay | Admitting: Hematology and Oncology

## 2014-02-15 LAB — HM PAP SMEAR

## 2014-02-16 ENCOUNTER — Other Ambulatory Visit (HOSPITAL_BASED_OUTPATIENT_CLINIC_OR_DEPARTMENT_OTHER): Payer: BC Managed Care – PPO

## 2014-02-16 ENCOUNTER — Ambulatory Visit (HOSPITAL_BASED_OUTPATIENT_CLINIC_OR_DEPARTMENT_OTHER): Payer: Self-pay | Admitting: Pharmacist

## 2014-02-16 DIAGNOSIS — I82721 Chronic embolism and thrombosis of deep veins of right upper extremity: Secondary | ICD-10-CM

## 2014-02-16 DIAGNOSIS — I824Z9 Acute embolism and thrombosis of unspecified deep veins of unspecified distal lower extremity: Secondary | ICD-10-CM

## 2014-02-16 LAB — CBC WITH DIFFERENTIAL/PLATELET
BASO%: 0.4 % (ref 0.0–2.0)
Basophils Absolute: 0 10*3/uL (ref 0.0–0.1)
EOS ABS: 0.1 10*3/uL (ref 0.0–0.5)
EOS%: 1.6 % (ref 0.0–7.0)
HCT: 39.2 % (ref 34.8–46.6)
HGB: 13.5 g/dL (ref 11.6–15.9)
LYMPH%: 36.4 % (ref 14.0–49.7)
MCH: 31.3 pg (ref 25.1–34.0)
MCHC: 34.4 g/dL (ref 31.5–36.0)
MCV: 91 fL (ref 79.5–101.0)
MONO#: 0.4 10*3/uL (ref 0.1–0.9)
MONO%: 6.7 % (ref 0.0–14.0)
NEUT%: 54.9 % (ref 38.4–76.8)
NEUTROS ABS: 3 10*3/uL (ref 1.5–6.5)
NRBC: 0 % (ref 0–0)
PLATELETS: 253 10*3/uL (ref 145–400)
RBC: 4.31 10*6/uL (ref 3.70–5.45)
RDW: 13.6 % (ref 11.2–14.5)
WBC: 5.5 10*3/uL (ref 3.9–10.3)
lymph#: 2 10*3/uL (ref 0.9–3.3)

## 2014-02-16 LAB — PROTIME-INR
INR: 2.8 (ref 2.00–3.50)
Protime: 33.6 Seconds — ABNORMAL HIGH (ref 10.6–13.4)

## 2014-02-16 LAB — POCT INR: INR: 2.8

## 2014-02-16 NOTE — Progress Notes (Signed)
INR = 2.8 on Coumadin 6 mg daily except 3 mg on Wed/Fri. Pt doing well re: anticoag.  No complaints. No recent missed doses. She is enrolled in 2 summer school classes this summer.  None of them would conflict w/ her appts here (one is on-line & the other is a night class). Pt reports she is going on a cruise in August & wondered if she should do anything differently w/ her Coumadin.  I advised her to avoid heavy EtOH consumption & perhaps have her INR drawn just before and then following her vacation. INR today is perfect so we'll keep her dose the same. Return in 6 weeks for next protime check. Kennith Center, Pharm.D., CPP 02/16/2014@8 :38 AM

## 2014-03-20 ENCOUNTER — Telehealth: Payer: Self-pay | Admitting: *Deleted

## 2014-03-20 ENCOUNTER — Other Ambulatory Visit: Payer: BC Managed Care – PPO

## 2014-03-20 ENCOUNTER — Ambulatory Visit: Payer: BC Managed Care – PPO

## 2014-03-20 NOTE — Telephone Encounter (Signed)
Called patient to notify her of Dr. Alvy Bimler instructions.  Verbalized understanding.  States she will resume coumadin on 03-24-2014.  Is currently scheduled for lab and coumadin clinic on 03-30-2014.

## 2014-03-20 NOTE — Telephone Encounter (Signed)
In dentist office in need of tooth extraction.  Dr.Dracups staff asking if this is safe to do in light of her being on coumadin.  Asked what protocol they follow and they want patient off coumadin for three days.  Informed staff I will notify Dr. Alvy Bimler and staff to coordinate this with coumadin therapy.  Asked if Dr. Bethann Punches has given patient referral to an oral surgeon.  Oral surgeon Dr. Benson Norway [phone] is the surgeon on the referral sheet given to patient.Marland Kitchen

## 2014-03-20 NOTE — Telephone Encounter (Signed)
Patient called expressing she has an appointment on Thursday at 3:45 with the oral surgeon.  "Tell Dr. Alvy Bimler my course/plan of action is to not take the coumadin today, tomorrow or Wednesday.  I am having this tooth pulled.  It has massive decay and hurts.  I'm taking tylenol and this is not helping.  If she agrees or disagrees She can call me at [phone]."  Will notify Dr. Alvy Bimler.

## 2014-03-20 NOTE — Telephone Encounter (Signed)
Pls call, OK to stop warfarin for 3 days, no bridging therapy is needed, resume the day after her procedure and return next week for INR check

## 2014-03-21 NOTE — Telephone Encounter (Signed)
Called Dr. Raynelle Dick office to notify them patient has stopped coumadin last evening in preparation for tooth removal.  Staff report this is for consult only.   Called Janet Hester notifying her. She request a "new referral because she has never had this much problem getting a tooth pulled".  Described need for evaluation, possible antibiotics, needing a driver and more before the oral surgeon can proceed.  Reports she did not take coumadin last night.  Thanked me for the call.  Advised that she try ice to her jaw/mouth and to avoid eating on that side.

## 2014-03-30 ENCOUNTER — Ambulatory Visit (HOSPITAL_BASED_OUTPATIENT_CLINIC_OR_DEPARTMENT_OTHER): Payer: BC Managed Care – PPO | Admitting: Pharmacist

## 2014-03-30 ENCOUNTER — Other Ambulatory Visit (HOSPITAL_BASED_OUTPATIENT_CLINIC_OR_DEPARTMENT_OTHER): Payer: BC Managed Care – PPO

## 2014-03-30 DIAGNOSIS — I824Z9 Acute embolism and thrombosis of unspecified deep veins of unspecified distal lower extremity: Secondary | ICD-10-CM

## 2014-03-30 DIAGNOSIS — I82721 Chronic embolism and thrombosis of deep veins of right upper extremity: Secondary | ICD-10-CM

## 2014-03-30 LAB — POCT INR: INR: 1.4

## 2014-03-30 LAB — PROTIME-INR
INR: 1.4 — AB (ref 2.00–3.50)
PROTIME: 16.8 s — AB (ref 10.6–13.4)

## 2014-03-30 NOTE — Progress Notes (Signed)
INR below goal today  Pt is doing well but she has had many issues the last few weeks She has been dealing with foot pain in her heals and was recently diagnosed with plantar fasciitis  She now has sneakers/inserts and this has helped. She has been avoiding wearing sandals But, she is going on a cruise in August for ~10 days and would like to be better by then to be able to wear her sandals.  Ms. Oravec also recently had a tooth extraction and was off coumadin x 3 days Her tooth was pulled last week on 03/24/14 Ms. Dicarlo did not require any lovenox bridging and resumed her coumadin on the night of 03/24/14   She has had 6 doses since then. This is likely why her coumadin remains below goal Pt reports no bleeding or bruising after procedure She is still having some pain/tenderness and takes Vicodin  Once a day usually in the evenings No other diet or medication changes  Plan: Take 6 mg today and tomorrow then Continue Coumadin 6mg  daily except for 3 mg (1/2 tablet) on Wednesdays and Fridays.  Recheck INR in 6 weeks on 05/11/14 prior to vacation; lab at 8:00am & coumadin clinic at 8:15am.

## 2014-03-30 NOTE — Patient Instructions (Signed)
INR below goal due to recent procedure Take 6 mg today and tomorrow then Continue Coumadin 6mg  daily except for 3 mg (1/2 tablet) on Wednesdays and Fridays.  Recheck INR in 6 weeks on 05/11/14; lab at 8:00am & coumadin clinic at 8:15am.

## 2014-04-11 ENCOUNTER — Other Ambulatory Visit: Payer: BC Managed Care – PPO

## 2014-04-11 ENCOUNTER — Ambulatory Visit: Payer: BC Managed Care – PPO

## 2014-05-11 ENCOUNTER — Other Ambulatory Visit (HOSPITAL_BASED_OUTPATIENT_CLINIC_OR_DEPARTMENT_OTHER): Payer: BC Managed Care – PPO

## 2014-05-11 ENCOUNTER — Ambulatory Visit (HOSPITAL_BASED_OUTPATIENT_CLINIC_OR_DEPARTMENT_OTHER): Payer: BC Managed Care – PPO | Admitting: Pharmacist

## 2014-05-11 DIAGNOSIS — I824Z2 Acute embolism and thrombosis of unspecified deep veins of left distal lower extremity: Secondary | ICD-10-CM

## 2014-05-11 DIAGNOSIS — I82721 Chronic embolism and thrombosis of deep veins of right upper extremity: Secondary | ICD-10-CM

## 2014-05-11 DIAGNOSIS — Z7901 Long term (current) use of anticoagulants: Secondary | ICD-10-CM

## 2014-05-11 DIAGNOSIS — I824Z9 Acute embolism and thrombosis of unspecified deep veins of unspecified distal lower extremity: Secondary | ICD-10-CM

## 2014-05-11 LAB — PROTIME-INR
INR: 2.7 (ref 2.00–3.50)
Protime: 32.4 Seconds — ABNORMAL HIGH (ref 10.6–13.4)

## 2014-05-11 LAB — POCT INR: INR: 2.7

## 2014-05-11 NOTE — Patient Instructions (Signed)
Continue Coumadin 6mg  daily except for 3mg  (1/2 tablet) on Wednesdays and Fridays.  Recheck INR in 4 weeks on 06/08/14; lab at 8:00am & coumadin clinic at 8:15am.

## 2014-05-11 NOTE — Progress Notes (Signed)
INR within goal today. No problems or concerns today regarding anticoagulation. No changes in diet or medications. No missed or extra coumadin doses. No unusual bruising or bleeding noted. No s/s of clotting noted. Pt is excited for her upcoming cruise to celebrate her birthday with family. She leaves in ~ 10 days. We discussed exercises to do while sitting on the plane as well as getting up and walking frequently while flying. Continue Coumadin 6mg  daily except for 3mg  (1/2 tablet) on Wednesdays and Fridays.  Recheck INR in 4 weeks on 06/08/14; lab at 8:00am & coumadin clinic at 8:15am.

## 2014-05-15 ENCOUNTER — Other Ambulatory Visit: Payer: Self-pay | Admitting: *Deleted

## 2014-05-15 MED ORDER — EPINEPHRINE 0.3 MG/0.3ML IJ SOAJ: 0.3000 mg | Freq: Once | INTRAMUSCULAR | Status: DC | PRN

## 2014-05-15 NOTE — Telephone Encounter (Signed)
Left msg on triage need new rx for epipen her old one has expired. Called pt back inform her will send to [facility].../lmb

## 2014-05-22 NOTE — Telephone Encounter (Signed)
Phone call - encounter closed. 

## 2014-06-08 ENCOUNTER — Other Ambulatory Visit (HOSPITAL_BASED_OUTPATIENT_CLINIC_OR_DEPARTMENT_OTHER): Payer: BC Managed Care – PPO

## 2014-06-08 ENCOUNTER — Ambulatory Visit (HOSPITAL_BASED_OUTPATIENT_CLINIC_OR_DEPARTMENT_OTHER): Payer: Self-pay | Admitting: Pharmacist

## 2014-06-08 DIAGNOSIS — I824Z9 Acute embolism and thrombosis of unspecified deep veins of unspecified distal lower extremity: Secondary | ICD-10-CM

## 2014-06-08 DIAGNOSIS — I82721 Chronic embolism and thrombosis of deep veins of right upper extremity: Secondary | ICD-10-CM

## 2014-06-08 LAB — POCT INR: INR: 1.9

## 2014-06-08 LAB — PROTIME-INR
INR: 1.9 — ABNORMAL LOW (ref 2.00–3.50)
PROTIME: 22.8 s — AB (ref 10.6–13.4)

## 2014-06-08 NOTE — Progress Notes (Signed)
INR = 1.9 on Coumadin 6 mg daily except 3 mg on Wed/Fri Pt just returned last Monday from a 10 day cruise around [place].  She enjoyed her trip.  She missed one day of her coumadin and she ate slightly higher amounts of vit K during her travels. No s/sxs of VTE today. No recent med changes. INR is slightly below goal but she has previously been stable on current dose.  I am not going to change her dose. Return in 4 weeks. Kennith Center, Pharm.D., CPP 06/08/2014@8 :34 AM

## 2014-06-21 ENCOUNTER — Ambulatory Visit (INDEPENDENT_AMBULATORY_CARE_PROVIDER_SITE_OTHER): Payer: BC Managed Care – PPO | Admitting: Internal Medicine

## 2014-06-21 ENCOUNTER — Encounter: Payer: Self-pay | Admitting: Internal Medicine

## 2014-06-21 VITALS — BP 108/70 | HR 83 | Temp 98.4°F | Resp 16 | Ht 72.0 in | Wt 247.4 lb

## 2014-06-21 DIAGNOSIS — R300 Dysuria: Secondary | ICD-10-CM

## 2014-06-21 DIAGNOSIS — M5430 Sciatica, unspecified side: Secondary | ICD-10-CM

## 2014-06-21 DIAGNOSIS — M5441 Lumbago with sciatica, right side: Secondary | ICD-10-CM

## 2014-06-21 DIAGNOSIS — Z23 Encounter for immunization: Secondary | ICD-10-CM

## 2014-06-21 LAB — POCT URINALYSIS DIPSTICK
BILIRUBIN UA: NEGATIVE
Blood, UA: NEGATIVE
Glucose, UA: NEGATIVE
KETONES UA: NEGATIVE
LEUKOCYTES UA: NEGATIVE
NITRITE UA: NEGATIVE
PH UA: 6
PROTEIN UA: NEGATIVE
Spec Grav, UA: 1.025
Urobilinogen, UA: 4

## 2014-06-21 MED ORDER — HYDROCODONE-ACETAMINOPHEN 5-325 MG PO TABS: 1.0000 | ORAL_TABLET | Freq: Four times a day (QID) | ORAL | Status: DC | PRN

## 2014-06-21 NOTE — Patient Instructions (Signed)
Back Pain, Adult Low back pain is very common. About 1 in 5 people have back pain.The cause of low back pain is rarely dangerous. The pain often gets better over time.About half of people with a sudden onset of back pain feel better in just 2 weeks. About 8 in 10 people feel better by 6 weeks.  CAUSES Some common causes of back pain include:  Strain of the muscles or ligaments supporting the spine.  Wear and tear (degeneration) of the spinal discs.  Arthritis.  Direct injury to the back. DIAGNOSIS Most of the time, the direct cause of low back pain is not known.However, back pain can be treated effectively even when the exact cause of the pain is unknown.Answering your caregiver's questions about your overall health and symptoms is one of the most accurate ways to make sure the cause of your pain is not dangerous. If your caregiver needs more information, he or she may order lab work or imaging tests (X-rays or MRIs).However, even if imaging tests show changes in your back, this usually does not require surgery. HOME CARE INSTRUCTIONS For many people, back pain returns.Since low back pain is rarely dangerous, it is often a condition that people can learn to manageon their own.   Remain active. It is stressful on the back to sit or stand in one place. Do not sit, drive, or stand in one place for more than 30 minutes at a time. Take short walks on level surfaces as soon as pain allows.Try to increase the length of time you walk each day.  Do not stay in bed.Resting more than 1 or 2 days can delay your recovery.  Do not avoid exercise or work.Your body is made to move.It is not dangerous to be active, even though your back may hurt.Your back will likely heal faster if you return to being active before your pain is gone.  Pay attention to your body when you bend and lift. Many people have less discomfortwhen lifting if they bend their knees, keep the load close to their bodies,and  avoid twisting. Often, the most comfortable positions are those that put less stress on your recovering back.  Find a comfortable position to sleep. Use a firm mattress and lie on your side with your knees slightly bent. If you lie on your back, put a pillow under your knees.  Only take over-the-counter or prescription medicines as directed by your caregiver. Over-the-counter medicines to reduce pain and inflammation are often the most helpful.Your caregiver may prescribe muscle relaxant drugs.These medicines help dull your pain so you can more quickly return to your normal activities and healthy exercise.  Put ice on the injured area.  Put ice in a plastic bag.  Place a towel between your skin and the bag.  Leave the ice on for 15-20 minutes, 03-04 times a day for the first 2 to 3 days. After that, ice and heat may be alternated to reduce pain and spasms.  Ask your caregiver about trying back exercises and gentle massage. This may be of some benefit.  Avoid feeling anxious or stressed.Stress increases muscle tension and can worsen back pain.It is important to recognize when you are anxious or stressed and learn ways to manage it.Exercise is a great option. SEEK MEDICAL CARE IF:  You have pain that is not relieved with rest or medicine.  You have pain that does not improve in 1 week.  You have new symptoms.  You are generally not feeling well. SEEK   IMMEDIATE MEDICAL CARE IF:   You have pain that radiates from your back into your legs.  You develop new bowel or bladder control problems.  You have unusual weakness or numbness in your arms or legs.  You develop nausea or vomiting.  You develop abdominal pain.  You feel faint. Document Released: 09/29/2005 Document Revised: 03/30/2012 Document Reviewed: 01/31/2014 ExitCare Patient Information 2015 [employer]. This information is not intended to replace advice given to you by your health care provider. Make sure you  discuss any questions you have with your health care provider.  

## 2014-06-21 NOTE — Progress Notes (Signed)
Pre visit review using our clinic review tool, if applicable. No additional management support is needed unless otherwise documented below in the visit note. 

## 2014-06-21 NOTE — Progress Notes (Signed)
Subjective:    Patient ID: Janet Hester, female    DOB: [birth year], 48 y.o.   MRN: [id number]  Back Pain This is a new problem. The current episode started in the past 7 days. The problem occurs intermittently. The problem is unchanged. The pain is present in the lumbar spine. The quality of the pain is described as stabbing. The pain radiates to the right thigh. The pain is at a severity of 3/10. The pain is worse during the day. The symptoms are aggravated by bending and standing. Associated symptoms include leg pain. Pertinent negatives include no abdominal pain, bladder incontinence, bowel incontinence, chest pain, dysuria, fever, headaches, numbness, paresis, paresthesias, pelvic pain, perianal numbness, tingling, weakness or weight loss. She has tried nothing for the symptoms. The treatment provided no relief.      Review of Systems  Constitutional: Negative.  Negative for fever and weight loss.  HENT: Negative.   Eyes: Negative.   Respiratory: Negative.  Negative for cough, choking, chest tightness, shortness of breath, wheezing and stridor.   Cardiovascular: Negative.  Negative for chest pain, palpitations and leg swelling.  Gastrointestinal: Negative.  Negative for nausea, vomiting, abdominal pain, diarrhea, constipation, blood in stool and bowel incontinence.  Endocrine: Negative.   Genitourinary: Negative.  Negative for bladder incontinence, dysuria and pelvic pain.  Musculoskeletal: Positive for back pain. Negative for arthralgias, gait problem, joint swelling, myalgias and neck pain.  Skin: Negative.  Negative for rash.  Allergic/Immunologic: Negative.   Neurological: Negative.  Negative for tingling, weakness, numbness, headaches and paresthesias.  Hematological: Negative.  Negative for adenopathy. Does not bruise/bleed easily.  Psychiatric/Behavioral: Negative.        Objective:   Physical Exam  Vitals reviewed. Constitutional: She is oriented to person, place, and  time. She appears well-developed and well-nourished. No distress.  HENT:  Head: Normocephalic and atraumatic.  Mouth/Throat: Oropharynx is clear and moist. No oropharyngeal exudate.  Eyes: Conjunctivae are normal. Right eye exhibits no discharge. Left eye exhibits no discharge. No scleral icterus.  Neck: Normal range of motion. Neck supple. No JVD present. No tracheal deviation present. No thyromegaly present.  Cardiovascular: Normal rate, regular rhythm, normal heart sounds and intact distal pulses.  Exam reveals no gallop and no friction rub.   No murmur heard. Pulmonary/Chest: Effort normal and breath sounds normal. No stridor. No respiratory distress. She has no wheezes. She has no rales. She exhibits no tenderness.  Abdominal: Soft. Bowel sounds are normal. She exhibits no distension and no mass. There is no tenderness. There is no rebound and no guarding.  Musculoskeletal:       Lumbar back: Normal. She exhibits normal range of motion, no tenderness, no bony tenderness, no swelling, no edema, no deformity, no laceration, no pain, no spasm and normal pulse.  Lymphadenopathy:    She has no cervical adenopathy.  Neurological: She is alert and oriented to person, place, and time. She has normal strength. She displays no atrophy, no tremor and normal reflexes. No cranial nerve deficit or sensory deficit. She exhibits normal muscle tone. She displays a negative Romberg sign. She displays no seizure activity. Coordination and gait normal.  Reflex Scores:      Tricep reflexes are 1+ on the right side and 1+ on the left side.      Bicep reflexes are 1+ on the right side and 1+ on the left side.      Brachioradialis reflexes are 1+ on the right side and 1+ on the left side.  Patellar reflexes are 1+ on the right side and 1+ on the left side.      Achilles reflexes are 1+ on the right side and 1+ on the left side. Neg SLR in BLE  Skin: Skin is warm and dry. No rash noted. She is not diaphoretic.  No erythema. No pallor.     Lab Results  Component Value Date   WBC 5.5 02/16/2014   HGB 13.5 02/16/2014   HCT 39.2 02/16/2014   PLT 253 02/16/2014   GLUCOSE 97 11/18/2013   ALT 17 11/18/2013   AST 22 11/18/2013   NA 138 11/18/2013   K 3.8 11/18/2013   CL 102 12/11/2012   CREATININE 0.8 11/18/2013   BUN 7.9 11/18/2013   CO2 21* 11/18/2013   TSH 0.66 07/19/2012   INR 1.90* 06/08/2014       Assessment & Plan:

## 2014-06-25 ENCOUNTER — Encounter: Payer: Self-pay | Admitting: Internal Medicine

## 2014-06-25 NOTE — Assessment & Plan Note (Signed)
Her UA is normal I don't think her pain is related to the kidneys or bladder

## 2014-06-25 NOTE — Assessment & Plan Note (Signed)
She has low back pain with danger signs by history or exam Her UA is normal She can't take nsaids Will control the pain with norco

## 2014-07-06 ENCOUNTER — Other Ambulatory Visit (HOSPITAL_BASED_OUTPATIENT_CLINIC_OR_DEPARTMENT_OTHER): Payer: BC Managed Care – PPO

## 2014-07-06 ENCOUNTER — Ambulatory Visit (HOSPITAL_BASED_OUTPATIENT_CLINIC_OR_DEPARTMENT_OTHER): Payer: Self-pay | Admitting: Pharmacist

## 2014-07-06 DIAGNOSIS — I824Z9 Acute embolism and thrombosis of unspecified deep veins of unspecified distal lower extremity: Secondary | ICD-10-CM

## 2014-07-06 DIAGNOSIS — I82721 Chronic embolism and thrombosis of deep veins of right upper extremity: Secondary | ICD-10-CM

## 2014-07-06 LAB — PROTIME-INR
INR: 2 (ref 2.00–3.50)
Protime: 24 Seconds — ABNORMAL HIGH (ref 10.6–13.4)

## 2014-07-06 LAB — POCT INR: INR: 2

## 2014-07-06 NOTE — Progress Notes (Signed)
Pt seen in clinic today INR=2.0 on 6mg  daily with 3mg  on Mon and Wed Pt has been on this dose since last Dec Will continue as she has had quite the INR range. She states she has been under stress at work as they are laying people off She is not returning to clinic for 4 weeks Instructed patient to come sooner if her work/insurance status changes No other changes to report RTC on Thur, Oct 22 at 8:15 for lab and 8:30 for CC

## 2014-07-06 NOTE — Patient Instructions (Signed)
Continue Coumadin 6mg  daily except for 3 mg (1/2 tablet) on Wednesdays and Fridays.  Recheck INR in 4 weeks on 08/03/14; lab at 8:15am & coumadin clinic at 8:37m.

## 2014-08-03 ENCOUNTER — Ambulatory Visit (HOSPITAL_BASED_OUTPATIENT_CLINIC_OR_DEPARTMENT_OTHER): Payer: BC Managed Care – PPO | Admitting: Pharmacist

## 2014-08-03 ENCOUNTER — Other Ambulatory Visit (HOSPITAL_BASED_OUTPATIENT_CLINIC_OR_DEPARTMENT_OTHER): Payer: BC Managed Care – PPO

## 2014-08-03 DIAGNOSIS — I824Z9 Acute embolism and thrombosis of unspecified deep veins of unspecified distal lower extremity: Secondary | ICD-10-CM

## 2014-08-03 DIAGNOSIS — I82402 Acute embolism and thrombosis of unspecified deep veins of left lower extremity: Secondary | ICD-10-CM

## 2014-08-03 DIAGNOSIS — Z7901 Long term (current) use of anticoagulants: Secondary | ICD-10-CM

## 2014-08-03 LAB — PROTIME-INR
INR: 2.8 (ref 2.00–3.50)
PROTIME: 33.6 s — AB (ref 10.6–13.4)

## 2014-08-03 LAB — POCT INR: INR: 2.8

## 2014-08-03 NOTE — Patient Instructions (Signed)
INR at goal No changes Continue Coumadin 6mg  daily except for 3 mg (1/2 tablet) on Wednesdays and Fridays.  Recheck INR in 4 weeks on 08/31/14; lab at 8:00am & coumadin clinic at 8:15am.

## 2014-08-03 NOTE — Progress Notes (Signed)
INR at goal Patient is doing well  She had a cortisone injection for right hip bursitis performed yesterday on 08/02/14 She also was prescribed tramadol prn for pain No missed or extra doses No medication or diet changes Janet Hester slipped while getting out of her vehicle 2 weeks ago and bumped her right leg on the side of her car. This resulted in a bruise that is now almost healed.  No other issues Plan: No changes Continue Coumadin 6mg  daily except for 3 mg (1/2 tablet) on Wednesdays and Fridays.  Recheck INR in 4 weeks on 08/31/14; lab at 8:00am & coumadin clinic at 8:15am.

## 2014-08-07 ENCOUNTER — Other Ambulatory Visit: Payer: Self-pay | Admitting: Hematology and Oncology

## 2014-08-30 ENCOUNTER — Telehealth: Payer: Self-pay | Admitting: Pharmacist

## 2014-08-30 NOTE — Telephone Encounter (Signed)
Patient called coumadin clinic this afternoon to cancel lab and coumadin clinic appointment for tomorrow 11/19. Pt is between jobs and is dealing with insurance issues. Pt states she will call and talk to a Education officer, museum for assistance with this matter. Ms. Thurow has been stable on coumadin and has plenty of coumadin at home. She will call to reschedule when she can. I informed Ms. Helm that we have coumadin samples available for her if she runs out and cannot afford her medication.  Thank you,  Montel Clock, Pharm.D., BCOP

## 2014-08-31 ENCOUNTER — Other Ambulatory Visit: Payer: BC Managed Care – PPO

## 2014-08-31 ENCOUNTER — Ambulatory Visit: Payer: BC Managed Care – PPO

## 2014-09-25 MED ORDER — WARFARIN SODIUM 6 MG PO TABS: ORAL_TABLET | ORAL | Status: DC

## 2014-09-27 NOTE — Progress Notes (Signed)
This encounter was created in error - please disregard.

## 2014-11-17 ENCOUNTER — Encounter: Payer: BC Managed Care – PPO | Admitting: Hematology and Oncology

## 2014-11-17 ENCOUNTER — Other Ambulatory Visit: Payer: BC Managed Care – PPO

## 2014-11-17 ENCOUNTER — Ambulatory Visit: Payer: Self-pay

## 2014-12-13 ENCOUNTER — Emergency Department (HOSPITAL_COMMUNITY)
Admission: EM | Admit: 2014-12-13 | Discharge: 2014-12-13 | Disposition: A | Discharge status: Home / Self Care | Payer: Self-pay | Attending: Emergency Medicine | Admitting: Emergency Medicine

## 2014-12-13 ENCOUNTER — Emergency Department (HOSPITAL_COMMUNITY): Payer: Self-pay

## 2014-12-13 ENCOUNTER — Encounter (HOSPITAL_COMMUNITY): Payer: Self-pay | Admitting: Emergency Medicine

## 2014-12-13 DIAGNOSIS — Z86718 Personal history of other venous thrombosis and embolism: Secondary | ICD-10-CM | POA: Insufficient documentation

## 2014-12-13 DIAGNOSIS — Z7901 Long term (current) use of anticoagulants: Secondary | ICD-10-CM | POA: Insufficient documentation

## 2014-12-13 DIAGNOSIS — J111 Influenza due to unidentified influenza virus with other respiratory manifestations: Secondary | ICD-10-CM | POA: Insufficient documentation

## 2014-12-13 DIAGNOSIS — M1990 Unspecified osteoarthritis, unspecified site: Secondary | ICD-10-CM | POA: Insufficient documentation

## 2014-12-13 DIAGNOSIS — F329 Major depressive disorder, single episode, unspecified: Secondary | ICD-10-CM | POA: Insufficient documentation

## 2014-12-13 DIAGNOSIS — Z720 Tobacco use: Secondary | ICD-10-CM | POA: Insufficient documentation

## 2014-12-13 DIAGNOSIS — Z79899 Other long term (current) drug therapy: Secondary | ICD-10-CM | POA: Insufficient documentation

## 2014-12-13 DIAGNOSIS — R52 Pain, unspecified: Secondary | ICD-10-CM | POA: Insufficient documentation

## 2014-12-13 LAB — RAPID STREP SCREEN (MED CTR MEBANE ONLY): Streptococcus, Group A Screen (Direct): NEGATIVE

## 2014-12-13 MED ORDER — KETOROLAC TROMETHAMINE 30 MG/ML IJ SOLN
30.0000 mg | Freq: Once | INTRAMUSCULAR | Status: AC
  Administered 2014-12-13: 30 mg via INTRAVENOUS
  Filled 2014-12-13: qty 1

## 2014-12-13 MED ORDER — SODIUM CHLORIDE 0.9 % IV BOLUS (SEPSIS)
1000.0000 mL | Freq: Once | INTRAVENOUS | Status: AC
  Administered 2014-12-13: 1000 mL via INTRAVENOUS

## 2014-12-13 MED ORDER — ACETAMINOPHEN-CODEINE 120-12 MG/5ML PO SOLN: 10.0000 mL | ORAL | Status: DC | PRN

## 2014-12-13 MED ORDER — IBUPROFEN 800 MG PO TABS: 800.0000 mg | ORAL_TABLET | Freq: Three times a day (TID) | ORAL | Status: DC | PRN

## 2014-12-13 MED ORDER — PROMETHAZINE-DM 6.25-15 MG/5ML PO SYRP: 5.0000 mL | ORAL_SOLUTION | Freq: Four times a day (QID) | ORAL | Status: DC | PRN

## 2014-12-13 MED ORDER — GUAIFENESIN ER 1200 MG PO TB12: 1.0000 | ORAL_TABLET | Freq: Two times a day (BID) | ORAL | Status: DC

## 2014-12-13 MED ORDER — IBUPROFEN 800 MG PO TABS
800.0000 mg | ORAL_TABLET | Freq: Once | ORAL | Status: AC
  Administered 2014-12-13: 800 mg via ORAL
  Filled 2014-12-13: qty 1

## 2014-12-13 MED ORDER — ACETAMINOPHEN 500 MG PO TABS
1000.0000 mg | ORAL_TABLET | Freq: Once | ORAL | Status: AC
Start: 2014-12-13 — End: 2014-12-13
  Administered 2014-12-13: 1000 mg via ORAL
  Filled 2014-12-13: qty 2

## 2014-12-13 MED ORDER — SODIUM CHLORIDE 0.9 % IV BOLUS (SEPSIS): 1000.0000 mL | Freq: Once | INTRAVENOUS | Status: DC

## 2014-12-13 MED ORDER — MORPHINE SULFATE 4 MG/ML IJ SOLN
4.0000 mg | Freq: Once | INTRAMUSCULAR | Status: AC
  Administered 2014-12-13: 4 mg via INTRAVENOUS
  Filled 2014-12-13: qty 1

## 2014-12-13 NOTE — ED Notes (Signed)
Pt instructed not to drive due to morphine; states is not going to drive today; instructed not to drive while taking tylenol with codeine

## 2014-12-13 NOTE — ED Notes (Signed)
Pt discharged home with family; states is feeling better at this time

## 2014-12-13 NOTE — ED Notes (Signed)
Pt states yesterday started having sore throat and runny nose. Pt states took some zyrtec because thought it was allergies but after awhile "could not move". Pt states knew she was running a fever but never took anything for it decided to come to ER instead. Pt c/o throat, ear and generalized body aches.

## 2014-12-13 NOTE — Discharge Instructions (Signed)
Rest as much as possible, increase your fluid intake.  Follow-up with your primary care doctor.

## 2014-12-13 NOTE — ED Provider Notes (Signed)
CSN: [id number]     Arrival date & time 12/13/14  1012 History   First MD Initiated Contact with Patient 12/13/14 1047     Chief Complaint  Patient presents with  . Sore Throat  . Generalized Body Aches     (Consider location/radiation/quality/duration/timing/severity/associated sxs/prior Treatment) HPI Patient presents to the emergency department with nausea, body aches, cough, runny nose, sore throat for the last 24 hours.  Patient states that she did not take any medications prior to arrival.  Patient states that she has not been around any sick contacts.  Patient denies chest pain, shortness breath, weakness, dizziness, headache, blurred vision, back pain, neck pain, dysuria, abdominal pain, vomiting, diarrhea, rash, or syncope.  Patient states that she had not seen anyone else about her symptoms Past Medical History  Diagnosis Date  . Depression   . 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   . Arthritis   . Bursitis    Past Surgical History  Procedure Laterality Date  . Laser ablation     Family History  Problem Relation Age of Onset  . Pulmonary embolism Mother   . Cancer Mother 86    cervical cancer  . Deep vein thrombosis Father   . Deep vein thrombosis Paternal Aunt   . Deep vein thrombosis Paternal Uncle   . Deep vein thrombosis Daughter   . Pulmonary embolism Daughter   . Deep vein thrombosis Daughter    History  Substance Use Topics  . Smoking status: Current Every Day Smoker -- 1.00 packs/day for 20 years  . Smokeless tobacco: Never Used  . Alcohol Use: No   OB History    No data available     Review of Systems  All other systems negative except as documented in the HPI. All pertinent positives and negatives as reviewed in the HPI.  Allergies  Shellfish allergy  Home Medications   Prior to Admission medications   Medication Sig Start  Date End Date Taking? Authorizing Provider  acetaminophen (TYLENOL) 500 MG tablet Take 1,000 mg by mouth every 6 (six) hours as needed for moderate pain (sore throat).    Yes Historical Provider, MD  clonazePAM (KLONOPIN) 0.5 MG tablet Take 1 tablet by mouth at bedtime.  06/11/12  Yes Historical Provider, MD  gabapentin (NEURONTIN) 100 MG capsule Take 300 mg by mouth at bedtime. For hot flashes   Yes Historical Provider, MD  traMADol (ULTRAM) 50 MG tablet Take 50 mg by mouth every 6 (six) hours as needed for moderate pain (pain).    Yes Historical Provider, MD  warfarin (COUMADIN) 6 MG tablet TAKE ONE TAB ONCE DAILY OR AS DIRECTED BY PHYSICIAN Patient taking differently: Take 3-6 mg by mouth daily. Take 1 tablet (6 mg) by mouth on Mon, Tues, Thurs, Sat, Sun & Take 0.5 tablet (3 mg) by mouth on Wed & Fri. 09/25/14  Yes Heath Lark, MD  EPINEPHrine (EPIPEN) 0.3 mg/0.3 mL IJ SOAJ injection Inject 0.3 mLs (0.3 mg total) into the muscle once as needed. 05/15/14   Janith Lima, MD  HYDROcodone-acetaminophen (NORCO/VICODIN) 5-325 MG per tablet Take 1 tablet by mouth every 6 (six) hours as needed for moderate pain. Patient not taking: Reported on 12/13/2014 06/21/14   Janith Lima, MD   BP 131/82 mmHg  Pulse 95  Temp(Src) 101.1 F (38.4 C) (Oral)  Resp 20  SpO2 93% Physical Exam  Constitutional: She is oriented to person, place, and time. She appears well-developed and well-nourished. No distress.  HENT:  Head: Normocephalic and atraumatic.  Mouth/Throat: Oropharynx is clear and moist.  Eyes: Pupils are equal, round, and reactive to light.  Neck: Normal range of motion. Neck supple.  Cardiovascular: Normal rate, regular rhythm and normal heart sounds.  Exam reveals no gallop and no friction rub.   No murmur heard. Pulmonary/Chest: Effort normal and breath sounds normal. No respiratory distress.  Abdominal: Soft. Bowel sounds are normal. She exhibits no distension. There is no tenderness.   Neurological: She is alert and oriented to person, place, and time. She exhibits normal muscle tone. Coordination normal.  Skin: Skin is warm and dry. No erythema.  Psychiatric: She has a normal mood and affect. Her behavior is normal.  Nursing note and vitals reviewed.   ED Course  Procedures (including critical care time) Labs Review Labs Reviewed  RAPID STREP SCREEN  CULTURE, GROUP A STREP    Imaging Review Dg Chest 2 View  12/13/2014   CLINICAL DATA:  Sore throat, generalized body ache  EXAM: CHEST  2 VIEW  COMPARISON:  12/11/2012  FINDINGS: Cardiomediastinal silhouette is stable. No acute infiltrate or pleural effusion. No pulmonary edema. Bony thorax is unremarkable.  IMPRESSION: No active cardiopulmonary disease.   Electronically Signed   By: Lahoma Crocker M.D.   On: 12/13/2014 12:38    Patient be treated for an influenza-like illness.  Told to return here as needed.  Follow-up with her primary care Dr. for recheck.  Patient agrees the plan and all questions were answered.  Told to rest and increase her fluid intake  MDM   Final diagnoses:  None       Brent General, PA-C 12/15/14 Batavia Allen, MD 12/17/14 [phone]

## 2014-12-16 LAB — CULTURE, GROUP A STREP

## 2015-01-05 ENCOUNTER — Emergency Department (HOSPITAL_COMMUNITY)
Admission: EM | Admit: 2015-01-05 | Discharge: 2015-01-05 | Disposition: A | Discharge status: Home / Self Care | Payer: Self-pay | Attending: Emergency Medicine | Admitting: Emergency Medicine

## 2015-01-05 ENCOUNTER — Emergency Department (HOSPITAL_COMMUNITY): Payer: Self-pay

## 2015-01-05 ENCOUNTER — Encounter (HOSPITAL_COMMUNITY): Payer: Self-pay | Admitting: Emergency Medicine

## 2015-01-05 DIAGNOSIS — Z86718 Personal history of other venous thrombosis and embolism: Secondary | ICD-10-CM | POA: Insufficient documentation

## 2015-01-05 DIAGNOSIS — M1990 Unspecified osteoarthritis, unspecified site: Secondary | ICD-10-CM | POA: Insufficient documentation

## 2015-01-05 DIAGNOSIS — Z8679 Personal history of other diseases of the circulatory system: Secondary | ICD-10-CM | POA: Insufficient documentation

## 2015-01-05 DIAGNOSIS — Y9389 Activity, other specified: Secondary | ICD-10-CM | POA: Insufficient documentation

## 2015-01-05 DIAGNOSIS — Y998 Other external cause status: Secondary | ICD-10-CM | POA: Insufficient documentation

## 2015-01-05 DIAGNOSIS — Z79899 Other long term (current) drug therapy: Secondary | ICD-10-CM | POA: Insufficient documentation

## 2015-01-05 DIAGNOSIS — W19XXXA Unspecified fall, initial encounter: Secondary | ICD-10-CM

## 2015-01-05 DIAGNOSIS — Y9289 Other specified places as the place of occurrence of the external cause: Secondary | ICD-10-CM | POA: Insufficient documentation

## 2015-01-05 DIAGNOSIS — Z720 Tobacco use: Secondary | ICD-10-CM | POA: Insufficient documentation

## 2015-01-05 DIAGNOSIS — Z8659 Personal history of other mental and behavioral disorders: Secondary | ICD-10-CM | POA: Insufficient documentation

## 2015-01-05 DIAGNOSIS — Z7901 Long term (current) use of anticoagulants: Secondary | ICD-10-CM | POA: Insufficient documentation

## 2015-01-05 DIAGNOSIS — W01198A Fall on same level from slipping, tripping and stumbling with subsequent striking against other object, initial encounter: Secondary | ICD-10-CM | POA: Insufficient documentation

## 2015-01-05 DIAGNOSIS — S39012A Strain of muscle, fascia and tendon of lower back, initial encounter: Secondary | ICD-10-CM | POA: Insufficient documentation

## 2015-01-05 DIAGNOSIS — M5441 Lumbago with sciatica, right side: Secondary | ICD-10-CM

## 2015-01-05 HISTORY — DX: Acute embolism and thrombosis of unspecified vein: I82.90

## 2015-01-05 LAB — I-STAT CHEM 8, ED
BUN: 11 mg/dL (ref 6–23)
CALCIUM ION: 1.07 mmol/L — AB (ref 1.12–1.23)
CHLORIDE: 105 mmol/L (ref 96–112)
CREATININE: 0.8 mg/dL (ref 0.50–1.10)
GLUCOSE: 90 mg/dL (ref 70–99)
HCT: 47 % — ABNORMAL HIGH (ref 36.0–46.0)
Hemoglobin: 16 g/dL — ABNORMAL HIGH (ref 12.0–15.0)
POTASSIUM: 4.4 mmol/L (ref 3.5–5.1)
Sodium: 138 mmol/L (ref 135–145)
TCO2: 19 mmol/L (ref 0–100)

## 2015-01-05 LAB — PROTIME-INR
INR: 1.34 (ref 0.00–1.49)
Prothrombin Time: 16.7 seconds — ABNORMAL HIGH (ref 11.6–15.2)

## 2015-01-05 MED ORDER — LORAZEPAM 2 MG/ML IJ SOLN
INTRAMUSCULAR | Status: AC
  Administered 2015-01-05: 1 mg via INTRAMUSCULAR
  Filled 2015-01-05: qty 1

## 2015-01-05 MED ORDER — IBUPROFEN 800 MG PO TABS
800.0000 mg | ORAL_TABLET | Freq: Once | ORAL | Status: DC
  Filled 2015-01-05: qty 1

## 2015-01-05 MED ORDER — HYDROCODONE-ACETAMINOPHEN 5-325 MG PO TABS: 1.0000 | ORAL_TABLET | Freq: Four times a day (QID) | ORAL | Status: DC | PRN

## 2015-01-05 MED ORDER — LORAZEPAM 2 MG/ML IJ SOLN
1.0000 mg | Freq: Once | INTRAMUSCULAR | Status: AC
  Administered 2015-01-05: 1 mg via INTRAMUSCULAR

## 2015-01-05 MED ORDER — OXYCODONE-ACETAMINOPHEN 5-325 MG PO TABS
1.0000 | ORAL_TABLET | Freq: Once | ORAL | Status: AC
  Administered 2015-01-05: 1 via ORAL
  Filled 2015-01-05: qty 1

## 2015-01-05 NOTE — ED Notes (Signed)
MD at bedside. 

## 2015-01-05 NOTE — ED Notes (Signed)
Pt tripped over shopping cart and fell to ground, shopping cart fell on top of her. Pt denies head injury or LOC. Pt on Warfarin.

## 2015-01-05 NOTE — Discharge Instructions (Signed)

## 2015-01-05 NOTE — ED Notes (Signed)
Lab draw x 1 unsuccessful RN made away pt left for xray

## 2015-01-05 NOTE — ED Provider Notes (Signed)
CSN: [id number]     Arrival date & time 01/05/15  1635 History   First MD Initiated Contact with Patient 01/05/15 1644     Chief Complaint  Patient presents with  . Fall     (Consider location/radiation/quality/duration/timing/severity/associated sxs/prior Treatment) Patient is a 49 y.o. female presenting with fall. The history is provided by the patient.  Fall Associated symptoms include headaches. Pertinent negatives include no chest pain, no abdominal pain and no shortness of breath.   patient presents after a fall. States she was at [facility] and stepped back after she grabbed a cart and tripped over something. States she has pain in her heels her back and hit the back of her head. She now has a headache. Initially went to fast track but then began to become more excited and have difficulty remembering things. Patient comes in hyperventilating. She calms down and is able to give more history. She is on Coumadin for DVT. Headache is dull.  Past Medical History  Diagnosis Date  . Depression   . 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   . Arthritis   . Bursitis   . Thrombus   . Bursitis    Past Surgical History  Procedure Laterality Date  . Laser ablation     Family History  Problem Relation Age of Onset  . Pulmonary embolism Mother   . Cancer Mother 110    cervical cancer  . Deep vein thrombosis Father   . Deep vein thrombosis Paternal Aunt   . Deep vein thrombosis Paternal Uncle   . Deep vein thrombosis Daughter   . Pulmonary embolism Daughter   . Deep vein thrombosis Daughter    History  Substance Use Topics  . Smoking status: Current Every Day Smoker -- 1.00 packs/day for 20 years  . Smokeless tobacco: Never Used  . Alcohol Use: No   OB History    No data available     Review of Systems  Constitutional: Negative for activity change and appetite  change.  Eyes: Negative for pain.  Respiratory: Negative for chest tightness and shortness of breath.   Cardiovascular: Negative for chest pain and leg swelling.  Gastrointestinal: Negative for nausea, abdominal pain and diarrhea.  Genitourinary: Negative for flank pain.  Musculoskeletal: Positive for back pain. Negative for neck stiffness.  Skin: Negative for rash.  Neurological: Positive for headaches.  Psychiatric/Behavioral: Negative for behavioral problems.      Allergies  Shellfish allergy  Home Medications   Prior to Admission medications   Medication Sig Start Date End Date Taking? Authorizing Provider  acetaminophen (TYLENOL) 500 MG tablet Take 1,000 mg by mouth every 6 (six) hours as needed for moderate pain (sore throat).    Yes Historical Provider, MD  EPINEPHrine (EPIPEN) 0.3 mg/0.3 mL IJ SOAJ injection Inject 0.3 mLs (0.3 mg total) into the muscle once as needed. 05/15/14  Yes Janith Lima, MD  gabapentin (NEURONTIN) 100 MG capsule Take 300 mg by mouth at bedtime. For hot flashes   Yes Historical Provider, MD  warfarin (COUMADIN) 6 MG tablet TAKE ONE TAB ONCE DAILY OR AS DIRECTED BY PHYSICIAN Patient taking differently: Take 3-6 mg by mouth daily. Take 1 tablet (6 mg) by mouth on Mon, Tues, Thurs, Sat, Sun & Take 0.5 tablet (3 mg) by mouth on Wed & Fri. 09/25/14  Yes Heath Lark, MD  acetaminophen-codeine  120-12 MG/5ML solution Take 10 mLs by mouth every 4 (four) hours as needed for moderate pain. Patient not taking: Reported on 01/05/2015 12/13/14   Dalia Heading, PA-C  Guaifenesin 1200 MG TB12 Take 1 tablet (1,200 mg total) by mouth 2 (two) times daily. Patient not taking: Reported on 01/05/2015 12/13/14   Dalia Heading, PA-C  Guaifenesin 1200 MG TB12 Take 1 tablet (1,200 mg total) by mouth 2 (two) times daily. Patient not taking: Reported on 01/05/2015 12/13/14   Dalia Heading, PA-C  HYDROcodone-acetaminophen (NORCO/VICODIN) 5-325 MG per tablet Take 1 tablet by  mouth every 6 (six) hours as needed for moderate pain. 01/05/15   Davonna Belling, MD  promethazine-dextromethorphan (PROMETHAZINE-DM) 6.25-15 MG/5ML syrup Take 5 mLs by mouth 4 (four) times daily as needed for cough. Patient not taking: Reported on 01/05/2015 12/13/14   Dalia Heading, PA-C   BP 134/77 mmHg  Pulse 84  Temp(Src) 98.3 F (36.8 C) (Oral)  Resp 20  SpO2 100% Physical Exam  Constitutional: She is oriented to person, place, and time. She appears well-developed and well-nourished.  HENT:  Head: Normocephalic and atraumatic.  Eyes: Pupils are equal, round, and reactive to light.  Neck: Normal range of motion. Neck supple.  Cardiovascular: Normal rate and regular rhythm.   No murmur heard. Pulmonary/Chest: Effort normal and breath sounds normal. No respiratory distress. She has no wheezes. She has no rales.  Abdominal: Soft. Bowel sounds are normal. She exhibits no distension. There is tenderness. There is no rebound and no guarding.  Musculoskeletal: Normal range of motion.  Lumbar tenderness without step-off or deformity. Mild tenderness over bilateral heels.  Neurological: She is alert and oriented to person, place, and time. No cranial nerve deficit.  Skin: Skin is warm and dry.  Psychiatric: She has a normal mood and affect. Her speech is normal.  Nursing note and vitals reviewed.   ED Course  Procedures (including critical care time) Labs Review Labs Reviewed  PROTIME-INR - Abnormal; Notable for the following:    Prothrombin Time 16.7 (*)    All other components within normal limits  I-STAT CHEM 8, ED - Abnormal; Notable for the following:    Calcium, Ion 1.07 (*)    Hemoglobin 16.0 (*)    HCT 47.0 (*)    All other components within normal limits    Imaging Review Dg Lumbar Spine Complete  01/05/2015   CLINICAL DATA:  Tripped and fell today.  Back pain.  EXAM: LUMBAR SPINE - COMPLETE 4+ VIEW  COMPARISON:  None.  FINDINGS: Normal alignment of the lumbar  vertebral bodies. Degenerative disc disease mainly at L5-S1. The facets are normally aligned. No pars defects. The visualized bony pelvis is intact.  IMPRESSION: No acute bony findings.  Degenerative disc disease at L5-S1.   Electronically Signed   By: Marijo Sanes M.D.   On: 01/05/2015 18:17   Ct Head Wo Contrast  01/05/2015   CLINICAL DATA:  Fall, tripped over shopping cart  EXAM: CT HEAD WITHOUT CONTRAST  TECHNIQUE: Contiguous axial images were obtained from the base of the skull through the vertex without intravenous contrast.  COMPARISON:  None.  FINDINGS: No intracranial hemorrhage, mass effect or midline shift. No skull fracture. Paranasal sinuses and mastoid air cells are unremarkable.  No acute infarction. No intra or extra-axial fluid collection. No mass lesion is noted on this unenhanced scan. The gray and white-matter differentiation is preserved.  IMPRESSION: No acute intracranial abnormality.   Electronically Signed   By: Lahoma Crocker  M.D.   On: 01/05/2015 17:31   Dg Foot Complete Left  01/05/2015   CLINICAL DATA:  Patient fell after tripping on shopping cart  EXAM: LEFT FOOT - COMPLETE 3+ VIEW  COMPARISON:  None.  FINDINGS: Frontal, oblique, and lateral views were obtained. There is no fracture or dislocation. Joint spaces appear intact. No erosive change. There is a small inferior calcaneal spur.  IMPRESSION: Inferior calcaneal spur. No fracture or dislocation. No appreciable arthropathy.   Electronically Signed   By: Lowella Grip III M.D.   On: 01/05/2015 18:19   Dg Foot Complete Right  01/05/2015   CLINICAL DATA:  Right foot pain post fall, tripped over shopping cart and fell  EXAM: RIGHT FOOT COMPLETE - 3+ VIEW  COMPARISON:  None.  FINDINGS: Three views of the right foot submitted. No acute fracture or subluxation. Small plantar spur of calcaneus.  IMPRESSION: No acute fracture or subluxation.  Small plantar spur of calcaneus.   Electronically Signed   By: Lahoma Crocker M.D.   On:  01/05/2015 18:19     EKG Interpretation None      MDM   Final diagnoses:  Fall, initial encounter  Lumbar strain, initial encounter    Patient presents after a fall. Some heel back and head pain. Is on Coumadin but is subtherapeutic. Will discharge home.  Davonna Belling, MD 01/05/15 2025

## 2015-01-12 ENCOUNTER — Other Ambulatory Visit: Payer: Self-pay | Admitting: Hematology and Oncology

## 2015-01-31 ENCOUNTER — Telehealth: Payer: Self-pay | Admitting: Pharmacist

## 2015-01-31 NOTE — Telephone Encounter (Signed)
Dr. Alvy Bimler corresponded: "Do not reschedule until patient sees MD"  Kennith Center, Pharm.D., CPP 01/31/2015@2 :54 PM

## 2015-01-31 NOTE — Telephone Encounter (Signed)
Called pt to touch base & determine if/where she is having her INR's checked on Coumadin. We've not seen her in our Coumadin clinic since Oct 2015. She's not seen Dr. Alvy Bimler since Feb 2015. She was in ED on 1.34 in ED on 01/05/15 - there for sore throat/aches. I left vm for pt to call us at [facility] Coumadin clinic & we can sched appt for INR check soon. Kennith Center, Pharm.D., CPP 01/31/2015@2 :46 PM

## 2015-02-05 ENCOUNTER — Telehealth: Payer: Self-pay

## 2015-02-05 ENCOUNTER — Telehealth: Payer: Self-pay | Admitting: *Deleted

## 2015-02-05 NOTE — Telephone Encounter (Signed)
FYI, I will not be comfortable managing coumadin without MD visit.

## 2015-02-05 NOTE — Telephone Encounter (Signed)
TC from patient of Dr. Alvy Bimler. Patient states she had a call from Coumadin clinic Barnett Applebaum tucker) on 01/31/15 about getting her pt/inr checked. Patient states she has no insurance and because of that she has not had her Coumadin refilled for >1 month.  States she could get samples from Coumadin clinic but needs her INR checked.  Patient also states that she is due for an appt. With Dr. Alvy Bimler but cannot come d/t insurance issues.  She would like to let Dr. Alvy Bimler know this. Was last seen February 2015. Transferred call to [place] for any possible assistance.

## 2015-02-05 NOTE — Telephone Encounter (Signed)
Ms. Tancredi called the Coumadin Clinic today to inform us that she no longer has health insurance, and has been out of her Coumadin therapy since March.  I told her the Coumadin Clinic could potentially provide samples of Coumadin, however she also reported that she cannot afford the clinic visit charge or the cost of the INR check. She also needs to be followed by one of our providers here to maintain enrollment in the Coumadin Clinic, and she had stated she had not yet contacted Dr. Alvy Bimler about this issue. I encouraged her to contact Dr. Calton Dach office for further guidance for her plan, as there is not much the Coumadin Clinic can offer at this time if she cannot afford INR checks or clinic visits.  She verbalized her understanding of the situation, and said she would call Dr. Calton Dach office after our conversation.

## 2015-02-07 ENCOUNTER — Encounter: Payer: Self-pay | Admitting: Hematology and Oncology

## 2015-02-07 ENCOUNTER — Telehealth: Payer: Self-pay | Admitting: *Deleted

## 2015-02-07 NOTE — Progress Notes (Signed)
Dr. Calton Dach nurse Cherre Huger inquired about any assistance pt could qualify for.  I informed her pt could apply for assistance thru the hospital, also suggested she apply for Medicaid and she can be set up on a payment arrangement.  I will mail her a Medicaid and financial application today.

## 2015-02-07 NOTE — Telephone Encounter (Signed)
Left patient a message that Dr Alvy Bimler cannot monitor coumadin without seeing her. [employer] will be mailing her an application for assistance through the hospital and an application for Medicaid

## 2015-03-15 ENCOUNTER — Other Ambulatory Visit (INDEPENDENT_AMBULATORY_CARE_PROVIDER_SITE_OTHER): Payer: 59

## 2015-03-15 ENCOUNTER — Ambulatory Visit (INDEPENDENT_AMBULATORY_CARE_PROVIDER_SITE_OTHER): Payer: 59 | Admitting: Internal Medicine

## 2015-03-15 VITALS — BP 118/78 | HR 81 | Temp 98.4°F | Resp 16 | Ht 72.0 in | Wt 253.0 lb

## 2015-03-15 DIAGNOSIS — D6852 Prothrombin gene mutation: Secondary | ICD-10-CM

## 2015-03-15 DIAGNOSIS — Z1231 Encounter for screening mammogram for malignant neoplasm of breast: Secondary | ICD-10-CM

## 2015-03-15 DIAGNOSIS — Z0000 Encounter for general adult medical examination without abnormal findings: Secondary | ICD-10-CM

## 2015-03-15 DIAGNOSIS — D6859 Other primary thrombophilia: Secondary | ICD-10-CM

## 2015-03-15 DIAGNOSIS — I824Z9 Acute embolism and thrombosis of unspecified deep veins of unspecified distal lower extremity: Secondary | ICD-10-CM

## 2015-03-15 DIAGNOSIS — F410 Panic disorder [episodic paroxysmal anxiety] without agoraphobia: Secondary | ICD-10-CM

## 2015-03-15 LAB — COMPREHENSIVE METABOLIC PANEL
ALBUMIN: 4.2 g/dL (ref 3.5–5.2)
ALT: 17 U/L (ref 0–35)
AST: 22 U/L (ref 0–37)
Alkaline Phosphatase: 54 U/L (ref 39–117)
BUN: 9 mg/dL (ref 6–23)
CALCIUM: 9.7 mg/dL (ref 8.4–10.5)
CO2: 26 meq/L (ref 19–32)
Chloride: 104 mEq/L (ref 96–112)
Creatinine, Ser: 0.81 mg/dL (ref 0.40–1.20)
GFR: 96.33 mL/min (ref >60.00)
GLUCOSE: 87 mg/dL (ref 70–99)
POTASSIUM: 3.8 meq/L (ref 3.5–5.1)
Sodium: 137 mEq/L (ref 135–145)
TOTAL PROTEIN: 7.8 g/dL (ref 6.0–8.3)
Total Bilirubin: 0.7 mg/dL (ref 0.2–1.2)

## 2015-03-15 LAB — CBC WITH DIFFERENTIAL/PLATELET
Basophils Absolute: 0 10*3/uL (ref 0.0–0.1)
Basophils Relative: 0.4 % (ref 0.0–3.0)
EOS ABS: 0.1 10*3/uL (ref 0.0–0.7)
Eosinophils Relative: 1.6 % (ref 0.0–5.0)
HCT: 42.1 % (ref 36.0–46.0)
Hemoglobin: 14 g/dL (ref 12.0–15.0)
Lymphocytes Relative: 32.1 % (ref 12.0–46.0)
Lymphs Abs: 2.1 10*3/uL (ref 0.7–4.0)
MCHC: 33.3 g/dL (ref 30.0–36.0)
MCV: 89.9 fl (ref 78.0–100.0)
Monocytes Absolute: 0.5 10*3/uL (ref 0.1–1.0)
Monocytes Relative: 7.2 % (ref 3.0–12.0)
Neutro Abs: 3.9 10*3/uL (ref 1.4–7.7)
Neutrophils Relative %: 58.7 % (ref 43.0–77.0)
PLATELETS: 270 10*3/uL (ref 150.0–400.0)
RBC: 4.68 Mil/uL (ref 3.87–5.11)
RDW: 13.4 % (ref 11.5–15.5)
WBC: 6.7 10*3/uL (ref 4.0–10.5)

## 2015-03-15 LAB — URINALYSIS, ROUTINE W REFLEX MICROSCOPIC
Bilirubin Urine: NEGATIVE
Hgb urine dipstick: NEGATIVE
KETONES UR: NEGATIVE
Leukocytes, UA: NEGATIVE
NITRITE: NEGATIVE
RBC / HPF: NONE SEEN (ref 0–?)
Specific Gravity, Urine: 1.02 (ref 1.000–1.030)
Total Protein, Urine: NEGATIVE
Urine Glucose: NEGATIVE
Urobilinogen, UA: 0.2 (ref 0.0–1.0)
WBC, UA: NONE SEEN (ref 0–?)
pH: 6 (ref 5.0–8.0)

## 2015-03-15 LAB — TSH: TSH: 1.08 u[IU]/mL (ref 0.35–4.50)

## 2015-03-15 LAB — LIPID PANEL
CHOL/HDL RATIO: 6
Cholesterol: 227 mg/dL — ABNORMAL HIGH (ref 0–200)
HDL: 37.2 mg/dL — ABNORMAL LOW (ref >39.00)
LDL Cholesterol: 150 mg/dL — ABNORMAL HIGH (ref 0–99)
NonHDL: 189.8
TRIGLYCERIDES: 200 mg/dL — AB (ref 0.0–149.0)
VLDL: 40 mg/dL (ref 0.0–40.0)

## 2015-03-15 MED ORDER — CLONAZEPAM 0.5 MG PO TABS
0.5000 mg | ORAL_TABLET | Freq: Two times a day (BID) | ORAL | Status: DC | PRN
Start: 2015-03-15 — End: 2015-06-27

## 2015-03-15 MED ORDER — RIVAROXABAN 20 MG PO TABS
20.0000 mg | ORAL_TABLET | Freq: Every day | ORAL | Status: DC
Start: 2015-03-15 — End: 2015-09-27

## 2015-03-15 NOTE — Progress Notes (Signed)
Pre visit review using our clinic review tool, if applicable. No additional management support is needed unless otherwise documented below in the visit note. 

## 2015-03-15 NOTE — Patient Instructions (Signed)
Preventive Care for Adults A healthy lifestyle and preventive care can promote health and wellness. Preventive health guidelines for women include the following key practices.  A routine yearly physical is a good way to check with your health care provider about your health and preventive screening. It is a chance to share any concerns and updates on your health and to receive a thorough exam.  Visit your dentist for a routine exam and preventive care every 6 months. Brush your teeth twice a day and floss once a day. Good oral hygiene prevents tooth decay and gum disease.  The frequency of eye exams is based on your age, health, family medical history, use of contact lenses, and other factors. Follow your health care provider's recommendations for frequency of eye exams.  Eat a healthy diet. Foods like vegetables, fruits, whole grains, low-fat dairy products, and lean protein foods contain the nutrients you need without too many calories. Decrease your intake of foods high in solid fats, added sugars, and salt. Eat the right amount of calories for you.Get information about a proper diet from your health care provider, if necessary.  Regular physical exercise is one of the most important things you can do for your health. Most adults should get at least 150 minutes of moderate-intensity exercise (any activity that increases your heart rate and causes you to sweat) each week. In addition, most adults need muscle-strengthening exercises on 2 or more days a week.  Maintain a healthy weight. The body mass index (BMI) is a screening tool to identify possible weight problems. It provides an estimate of body fat based on height and weight. Your health care provider can find your BMI and can help you achieve or maintain a healthy weight.For adults 20 years and older:  A BMI below 18.5 is considered underweight.  A BMI of 18.5 to 24.9 is normal.  A BMI of 25 to 29.9 is considered overweight.  A BMI of  30 and above is considered obese.  Maintain normal blood lipids and cholesterol levels by exercising and minimizing your intake of saturated fat. Eat a balanced diet with plenty of fruit and vegetables. Blood tests for lipids and cholesterol should begin at age 76 and be repeated every 5 years. If your lipid or cholesterol levels are high, you are over 50, or you are at high risk for heart disease, you may need your cholesterol levels checked more frequently.Ongoing high lipid and cholesterol levels should be treated with medicines if diet and exercise are not working.  If you smoke, find out from your health care provider how to quit. If you do not use tobacco, do not start.  Lung cancer screening is recommended for adults aged 22-80 years who are at high risk for developing lung cancer because of a history of smoking. A yearly low-dose CT scan of the lungs is recommended for people who have at least a 30-pack-year history of smoking and are a current smoker or have quit within the past 15 years. A pack year of smoking is smoking an average of 1 pack of cigarettes a day for 1 year (for example: 1 pack a day for 30 years or 2 packs a day for 15 years). Yearly screening should continue until the smoker has stopped smoking for at least 15 years. Yearly screening should be stopped for people who develop a health problem that would prevent them from having lung cancer treatment.  If you are pregnant, do not drink alcohol. If you are breastfeeding,  be very cautious about drinking alcohol. If you are not pregnant and choose to drink alcohol, do not have more than 1 drink per day. One drink is considered to be 12 ounces (355 mL) of beer, 5 ounces (148 mL) of wine, or 1.5 ounces (44 mL) of liquor.  Avoid use of street drugs. Do not share needles with anyone. Ask for help if you need support or instructions about stopping the use of drugs.  High blood pressure causes heart disease and increases the risk of  stroke. Your blood pressure should be checked at least every 1 to 2 years. Ongoing high blood pressure should be treated with medicines if weight loss and exercise do not work.  If you are 75-52 years old, ask your health care provider if you should take aspirin to prevent strokes.  Diabetes screening involves taking a blood sample to check your fasting blood sugar level. This should be done once every 3 years, after age 15, if you are within normal weight and without risk factors for diabetes. Testing should be considered at a younger age or be carried out more frequently if you are overweight and have at least 1 risk factor for diabetes.  Breast cancer screening is essential preventive care for women. You should practice "breast self-awareness." This means understanding the normal appearance and feel of your breasts and may include breast self-examination. Any changes detected, no matter how small, should be reported to a health care provider. Women in their 58s and 30s should have a clinical breast exam (CBE) by a health care provider as part of a regular health exam every 1 to 3 years. After age 16, women should have a CBE every year. Starting at age 53, women should consider having a mammogram (breast X-ray test) every year. Women who have a family history of breast cancer should talk to their health care provider about genetic screening. Women at a high risk of breast cancer should talk to their health care providers about having an MRI and a mammogram every year.  Breast cancer gene (BRCA)-related cancer risk assessment is recommended for women who have family members with BRCA-related cancers. BRCA-related cancers include breast, ovarian, tubal, and peritoneal cancers. Having family members with these cancers may be associated with an increased risk for harmful changes (mutations) in the breast cancer genes BRCA1 and BRCA2. Results of the assessment will determine the need for genetic counseling and  BRCA1 and BRCA2 testing.  Routine pelvic exams to screen for cancer are no longer recommended for nonpregnant women who are considered low risk for cancer of the pelvic organs (ovaries, uterus, and vagina) and who do not have symptoms. Ask your health care provider if a screening pelvic exam is right for you.  If you have had past treatment for cervical cancer or a condition that could lead to cancer, you need Pap tests and screening for cancer for at least 20 years after your treatment. If Pap tests have been discontinued, your risk factors (such as having a new sexual partner) need to be reassessed to determine if screening should be resumed. Some women have medical problems that increase the chance of getting cervical cancer. In these cases, your health care provider may recommend more frequent screening and Pap tests.  The HPV test is an additional test that may be used for cervical cancer screening. The HPV test looks for the virus that can cause the cell changes on the cervix. The cells collected during the Pap test can be  tested for HPV. The HPV test could be used to screen women aged 30 years and older, and should be used in women of any age who have unclear Pap test results. After the age of 30, women should have HPV testing at the same frequency as a Pap test.  Colorectal cancer can be detected and often prevented. Most routine colorectal cancer screening begins at the age of 50 years and continues through age 75 years. However, your health care provider may recommend screening at an earlier age if you have risk factors for colon cancer. On a yearly basis, your health care provider may provide home test kits to check for hidden blood in the stool. Use of a small camera at the end of a tube, to directly examine the colon (sigmoidoscopy or colonoscopy), can detect the earliest forms of colorectal cancer. Talk to your health care provider about this at age 50, when routine screening begins. Direct  exam of the colon should be repeated every 5-10 years through age 75 years, unless early forms of pre-cancerous polyps or small growths are found.  People who are at an increased risk for hepatitis B should be screened for this virus. You are considered at high risk for hepatitis B if:  You were born in a country where hepatitis B occurs often. Talk with your health care provider about which countries are considered high risk.  Your parents were born in a high-risk country and you have not received a shot to protect against hepatitis B (hepatitis B vaccine).  You have HIV or AIDS.  You use needles to inject street drugs.  You live with, or have sex with, someone who has hepatitis B.  You get hemodialysis treatment.  You take certain medicines for conditions like cancer, organ transplantation, and autoimmune conditions.  Hepatitis C blood testing is recommended for all people born from 1945 through 1965 and any individual with known risks for hepatitis C.  Practice safe sex. Use condoms and avoid high-risk sexual practices to reduce the spread of sexually transmitted infections (STIs). STIs include gonorrhea, chlamydia, syphilis, trichomonas, herpes, HPV, and human immunodeficiency virus (HIV). Herpes, HIV, and HPV are viral illnesses that have no cure. They can result in disability, cancer, and death.  You should be screened for sexually transmitted illnesses (STIs) including gonorrhea and chlamydia if:  You are sexually active and are younger than 24 years.  You are older than 24 years and your health care provider tells you that you are at risk for this type of infection.  Your sexual activity has changed since you were last screened and you are at an increased risk for chlamydia or gonorrhea. Ask your health care provider if you are at risk.  If you are at risk of being infected with HIV, it is recommended that you take a prescription medicine daily to prevent HIV infection. This is  called preexposure prophylaxis (PrEP). You are considered at risk if:  You are a heterosexual woman, are sexually active, and are at increased risk for HIV infection.  You take drugs by injection.  You are sexually active with a partner who has HIV.  Talk with your health care provider about whether you are at high risk of being infected with HIV. If you choose to begin PrEP, you should first be tested for HIV. You should then be tested every 3 months for as long as you are taking PrEP.  Osteoporosis is a disease in which the bones lose minerals and strength   with aging. This can result in serious bone fractures or breaks. The risk of osteoporosis can be identified using a bone density scan. Women ages 65 years and over and women at risk for fractures or osteoporosis should discuss screening with their health care providers. Ask your health care provider whether you should take a calcium supplement or vitamin D to reduce the rate of osteoporosis.  Menopause can be associated with physical symptoms and risks. Hormone replacement therapy is available to decrease symptoms and risks. You should talk to your health care provider about whether hormone replacement therapy is right for you.  Use sunscreen. Apply sunscreen liberally and repeatedly throughout the day. You should seek shade when your shadow is shorter than you. Protect yourself by wearing long sleeves, pants, a wide-brimmed hat, and sunglasses year round, whenever you are outdoors.  Once a month, do a whole body skin exam, using a mirror to look at the skin on your back. Tell your health care provider of new moles, moles that have irregular borders, moles that are larger than a pencil eraser, or moles that have changed in shape or color.  Stay current with required vaccines (immunizations).  Influenza vaccine. All adults should be immunized every year.  Tetanus, diphtheria, and acellular pertussis (Td, Tdap) vaccine. Pregnant women should  receive 1 dose of Tdap vaccine during each pregnancy. The dose should be obtained regardless of the length of time since the last dose. Immunization is preferred during the 27th-36th week of gestation. An adult who has not previously received Tdap or who does not know her vaccine status should receive 1 dose of Tdap. This initial dose should be followed by tetanus and diphtheria toxoids (Td) booster doses every 10 years. Adults with an unknown or incomplete history of completing a 3-dose immunization series with Td-containing vaccines should begin or complete a primary immunization series including a Tdap dose. Adults should receive a Td booster every 10 years.  Varicella vaccine. An adult without evidence of immunity to varicella should receive 2 doses or a second dose if she has previously received 1 dose. Pregnant females who do not have evidence of immunity should receive the first dose after pregnancy. This first dose should be obtained before leaving the health care facility. The second dose should be obtained 4-8 weeks after the first dose.  Human papillomavirus (HPV) vaccine. Females aged 13-26 years who have not received the vaccine previously should obtain the 3-dose series. The vaccine is not recommended for use in pregnant females. However, pregnancy testing is not needed before receiving a dose. If a female is found to be pregnant after receiving a dose, no treatment is needed. In that case, the remaining doses should be delayed until after the pregnancy. Immunization is recommended for any person with an immunocompromised condition through the age of 26 years if she did not get any or all doses earlier. During the 3-dose series, the second dose should be obtained 4-8 weeks after the first dose. The third dose should be obtained 24 weeks after the first dose and 16 weeks after the second dose.  Zoster vaccine. One dose is recommended for adults aged 60 years or older unless certain conditions are  present.  Measles, mumps, and rubella (MMR) vaccine. Adults born before 1957 generally are considered immune to measles and mumps. Adults born in 1957 or later should have 1 or more doses of MMR vaccine unless there is a contraindication to the vaccine or there is laboratory evidence of immunity to   each of the three diseases. A routine second dose of MMR vaccine should be obtained at least 28 days after the first dose for students attending postsecondary schools, health care workers, or international travelers. People who received inactivated measles vaccine or an unknown type of measles vaccine during 1963-1967 should receive 2 doses of MMR vaccine. People who received inactivated mumps vaccine or an unknown type of mumps vaccine before 1979 and are at high risk for mumps infection should consider immunization with 2 doses of MMR vaccine. For females of childbearing age, rubella immunity should be determined. If there is no evidence of immunity, females who are not pregnant should be vaccinated. If there is no evidence of immunity, females who are pregnant should delay immunization until after pregnancy. Unvaccinated health care workers born before [birth year] who lack laboratory evidence of measles, mumps, or rubella immunity or laboratory confirmation of disease should consider measles and mumps immunization with 2 doses of MMR vaccine or rubella immunization with 1 dose of MMR vaccine.  Pneumococcal 13-valent conjugate (PCV13) vaccine. When indicated, a person who is uncertain of her immunization history and has no record of immunization should receive the PCV13 vaccine. An adult aged 42 years or older who has certain medical conditions and has not been previously immunized should receive 1 dose of PCV13 vaccine. This PCV13 should be followed with a dose of pneumococcal polysaccharide (PPSV23) vaccine. The PPSV23 vaccine dose should be obtained at least 8 weeks after the dose of PCV13 vaccine. An adult aged 58  years or older who has certain medical conditions and previously received 1 or more doses of PPSV23 vaccine should receive 1 dose of PCV13. The PCV13 vaccine dose should be obtained 1 or more years after the last PPSV23 vaccine dose.  Pneumococcal polysaccharide (PPSV23) vaccine. When PCV13 is also indicated, PCV13 should be obtained first. All adults aged 6 years and older should be immunized. An adult younger than age 25 years who has certain medical conditions should be immunized. Any person who resides in a nursing home or long-term care facility should be immunized. An adult smoker should be immunized. People with an immunocompromised condition and certain other conditions should receive both PCV13 and PPSV23 vaccines. People with human immunodeficiency virus (HIV) infection should be immunized as soon as possible after diagnosis. Immunization during chemotherapy or radiation therapy should be avoided. Routine use of PPSV23 vaccine is not recommended for American Indians, [place] Natives, or people younger than 65 years unless there are medical conditions that require PPSV23 vaccine. When indicated, people who have unknown immunization and have no record of immunization should receive PPSV23 vaccine. One-time revaccination 5 years after the first dose of PPSV23 is recommended for people aged 19-64 years who have chronic kidney failure, nephrotic syndrome, asplenia, or immunocompromised conditions. People who received 1-2 doses of PPSV23 before age 58 years should receive another dose of PPSV23 vaccine at age 88 years or later if at least 5 years have passed since the previous dose. Doses of PPSV23 are not needed for people immunized with PPSV23 at or after age 52 years.  Meningococcal vaccine. Adults with asplenia or persistent complement component deficiencies should receive 2 doses of quadrivalent meningococcal conjugate (MenACWY-D) vaccine. The doses should be obtained at least 2 months apart.  Microbiologists working with certain meningococcal bacteria, [place] recruits, people at risk during an outbreak, and people who travel to or live in countries with a high rate of meningitis should be immunized. A first-year college student up through age  21 years who is living in a residence hall should receive a dose if she did not receive a dose on or after her 16th birthday. Adults who have certain high-risk conditions should receive one or more doses of vaccine.  Hepatitis A vaccine. Adults who wish to be protected from this disease, have certain high-risk conditions, work with hepatitis A-infected animals, work in hepatitis A research labs, or travel to or work in countries with a high rate of hepatitis A should be immunized. Adults who were previously unvaccinated and who anticipate close contact with an international adoptee during the first 60 days after arrival in the [place] from a country with a high rate of hepatitis A should be immunized.  Hepatitis B vaccine. Adults who wish to be protected from this disease, have certain high-risk conditions, may be exposed to blood or other infectious body fluids, are household contacts or sex partners of hepatitis B positive people, are clients or workers in certain care facilities, or travel to or work in countries with a high rate of hepatitis B should be immunized.  Haemophilus influenzae type b (Hib) vaccine. A previously unvaccinated person with asplenia or sickle cell disease or having a scheduled splenectomy should receive 1 dose of Hib vaccine. Regardless of previous immunization, a recipient of a hematopoietic stem cell transplant should receive a 3-dose series 6-12 months after her successful transplant. Hib vaccine is not recommended for adults with HIV infection. Preventive Services / Frequency Ages 26 to 64 years  Blood pressure check.** / Every 1 to 2 years.  Lipid and cholesterol check.** / Every 5 years beginning at age  75.  Clinical breast exam.** / Every 3 years for women in their 45s and 45s.  BRCA-related cancer risk assessment.** / For women who have family members with a BRCA-related cancer (breast, ovarian, tubal, or peritoneal cancers).  Pap test.** / Every 2 years from ages 93 through 62. Every 3 years starting at age 69 through age 56 or 38 with a history of 3 consecutive normal Pap tests.  HPV screening.** / Every 3 years from ages 23 through ages 16 to 48 with a history of 3 consecutive normal Pap tests.  Hepatitis C blood test.** / For any individual with known risks for hepatitis C.  Skin self-exam. / Monthly.  Influenza vaccine. / Every year.  Tetanus, diphtheria, and acellular pertussis (Tdap, Td) vaccine.** / Consult your health care provider. Pregnant women should receive 1 dose of Tdap vaccine during each pregnancy. 1 dose of Td every 10 years.  Varicella vaccine.** / Consult your health care provider. Pregnant females who do not have evidence of immunity should receive the first dose after pregnancy.  HPV vaccine. / 3 doses over 6 months, if 57 and younger. The vaccine is not recommended for use in pregnant females. However, pregnancy testing is not needed before receiving a dose.  Measles, mumps, rubella (MMR) vaccine.** / You need at least 1 dose of MMR if you were born in 1957 or later. You may also need a 2nd dose. For females of childbearing age, rubella immunity should be determined. If there is no evidence of immunity, females who are not pregnant should be vaccinated. If there is no evidence of immunity, females who are pregnant should delay immunization until after pregnancy.  Pneumococcal 13-valent conjugate (PCV13) vaccine.** / Consult your health care provider.  Pneumococcal polysaccharide (PPSV23) vaccine.** / 1 to 2 doses if you smoke cigarettes or if you have certain conditions.  Meningococcal vaccine.** /  1 dose if you are age 19 to 21 years and a first-year college  student living in a residence hall, or have one of several medical conditions, you need to get vaccinated against meningococcal disease. You may also need additional booster doses.  Hepatitis A vaccine.** / Consult your health care provider.  Hepatitis B vaccine.** / Consult your health care provider.  Haemophilus influenzae type b (Hib) vaccine.** / Consult your health care provider. Ages 40 to 64 years  Blood pressure check.** / Every 1 to 2 years.  Lipid and cholesterol check.** / Every 5 years beginning at age 20 years.  Lung cancer screening. / Every year if you are aged 55-80 years and have a 30-pack-year history of smoking and currently smoke or have quit within the past 15 years. Yearly screening is stopped once you have quit smoking for at least 15 years or develop a health problem that would prevent you from having lung cancer treatment.  Clinical breast exam.** / Every year after age 40 years.  BRCA-related cancer risk assessment.** / For women who have family members with a BRCA-related cancer (breast, ovarian, tubal, or peritoneal cancers).  Mammogram.** / Every year beginning at age 40 years and continuing for as long as you are in good health. Consult with your health care provider.  Pap test.** / Every 3 years starting at age 30 years through age 65 or 70 years with a history of 3 consecutive normal Pap tests.  HPV screening.** / Every 3 years from ages 30 years through ages 65 to 70 years with a history of 3 consecutive normal Pap tests.  Fecal occult blood test (FOBT) of stool. / Every year beginning at age 50 years and continuing until age 75 years. You may not need to do this test if you get a colonoscopy every 10 years.  Flexible sigmoidoscopy or colonoscopy.** / Every 5 years for a flexible sigmoidoscopy or every 10 years for a colonoscopy beginning at age 50 years and continuing until age 75 years.  Hepatitis C blood test.** / For all people born from 1945 through  1965 and any individual with known risks for hepatitis C.  Skin self-exam. / Monthly.  Influenza vaccine. / Every year.  Tetanus, diphtheria, and acellular pertussis (Tdap/Td) vaccine.** / Consult your health care provider. Pregnant women should receive 1 dose of Tdap vaccine during each pregnancy. 1 dose of Td every 10 years.  Varicella vaccine.** / Consult your health care provider. Pregnant females who do not have evidence of immunity should receive the first dose after pregnancy.  Zoster vaccine.** / 1 dose for adults aged 60 years or older.  Measles, mumps, rubella (MMR) vaccine.** / You need at least 1 dose of MMR if you were born in 1957 or later. You may also need a 2nd dose. For females of childbearing age, rubella immunity should be determined. If there is no evidence of immunity, females who are not pregnant should be vaccinated. If there is no evidence of immunity, females who are pregnant should delay immunization until after pregnancy.  Pneumococcal 13-valent conjugate (PCV13) vaccine.** / Consult your health care provider.  Pneumococcal polysaccharide (PPSV23) vaccine.** / 1 to 2 doses if you smoke cigarettes or if you have certain conditions.  Meningococcal vaccine.** / Consult your health care provider.  Hepatitis A vaccine.** / Consult your health care provider.  Hepatitis B vaccine.** / Consult your health care provider.  Haemophilus influenzae type b (Hib) vaccine.** / Consult your health care provider. Ages 65   years and over  Blood pressure check.** / Every 1 to 2 years.  Lipid and cholesterol check.** / Every 5 years beginning at age 22 years.  Lung cancer screening. / Every year if you are aged 73-80 years and have a 30-pack-year history of smoking and currently smoke or have quit within the past 15 years. Yearly screening is stopped once you have quit smoking for at least 15 years or develop a health problem that would prevent you from having lung cancer  treatment.  Clinical breast exam.** / Every year after age 4 years.  BRCA-related cancer risk assessment.** / For women who have family members with a BRCA-related cancer (breast, ovarian, tubal, or peritoneal cancers).  Mammogram.** / Every year beginning at age 40 years and continuing for as long as you are in good health. Consult with your health care provider.  Pap test.** / Every 3 years starting at age 9 years through age 34 or 91 years with 3 consecutive normal Pap tests. Testing can be stopped between 65 and 70 years with 3 consecutive normal Pap tests and no abnormal Pap or HPV tests in the past 10 years.  HPV screening.** / Every 3 years from ages 57 years through ages 64 or 45 years with a history of 3 consecutive normal Pap tests. Testing can be stopped between 65 and 70 years with 3 consecutive normal Pap tests and no abnormal Pap or HPV tests in the past 10 years.  Fecal occult blood test (FOBT) of stool. / Every year beginning at age 15 years and continuing until age 17 years. You may not need to do this test if you get a colonoscopy every 10 years.  Flexible sigmoidoscopy or colonoscopy.** / Every 5 years for a flexible sigmoidoscopy or every 10 years for a colonoscopy beginning at age 86 years and continuing until age 71 years.  Hepatitis C blood test.** / For all people born from [birth year] through 1966 and any individual with known risks for hepatitis C.  Osteoporosis screening.** / A one-time screening for women ages 83 years and over and women at risk for fractures or osteoporosis.  Skin self-exam. / Monthly.  Influenza vaccine. / Every year.  Tetanus, diphtheria, and acellular pertussis (Tdap/Td) vaccine.** / 1 dose of Td every 10 years.  Varicella vaccine.** / Consult your health care provider.  Zoster vaccine.** / 1 dose for adults aged 61 years or older.  Pneumococcal 13-valent conjugate (PCV13) vaccine.** / Consult your health care provider.  Pneumococcal  polysaccharide (PPSV23) vaccine.** / 1 dose for all adults aged 28 years and older.  Meningococcal vaccine.** / Consult your health care provider.  Hepatitis A vaccine.** / Consult your health care provider.  Hepatitis B vaccine.** / Consult your health care provider.  Haemophilus influenzae type b (Hib) vaccine.** / Consult your health care provider. ** Family history and personal history of risk and conditions may change your health care provider's recommendations. Document Released: 11/25/2001 Document Revised: 02/13/2014 Document Reviewed: 02/24/2011 [facility] Patient Information 2015 [place], [place]. This information is not intended to replace advice given to you by your health care provider. Make sure you discuss any questions you have with your health care provider.

## 2015-03-16 ENCOUNTER — Emergency Department (HOSPITAL_COMMUNITY)
Admission: EM | Admit: 2015-03-16 | Discharge: 2015-03-17 | Disposition: A | Discharge status: Home / Self Care | Payer: 59 | Attending: Emergency Medicine | Admitting: Emergency Medicine

## 2015-03-16 ENCOUNTER — Encounter (HOSPITAL_COMMUNITY): Payer: Self-pay | Admitting: Emergency Medicine

## 2015-03-16 DIAGNOSIS — Z86718 Personal history of other venous thrombosis and embolism: Secondary | ICD-10-CM | POA: Insufficient documentation

## 2015-03-16 DIAGNOSIS — M7989 Other specified soft tissue disorders: Secondary | ICD-10-CM | POA: Diagnosis not present

## 2015-03-16 DIAGNOSIS — Z720 Tobacco use: Secondary | ICD-10-CM | POA: Insufficient documentation

## 2015-03-16 DIAGNOSIS — Z8679 Personal history of other diseases of the circulatory system: Secondary | ICD-10-CM | POA: Insufficient documentation

## 2015-03-16 DIAGNOSIS — M79662 Pain in left lower leg: Secondary | ICD-10-CM | POA: Diagnosis present

## 2015-03-16 DIAGNOSIS — Z8659 Personal history of other mental and behavioral disorders: Secondary | ICD-10-CM | POA: Diagnosis not present

## 2015-03-16 LAB — D-DIMER, QUANTITATIVE (NOT AT ARMC): D DIMER QUANT: 0.7 ug{FEU}/mL — AB (ref 0.00–0.48)

## 2015-03-16 MED ORDER — ENOXAPARIN SODIUM 100 MG/ML ~~LOC~~ SOLN: 115.0000 mg | Freq: Once | SUBCUTANEOUS | Status: DC

## 2015-03-16 NOTE — ED Notes (Addendum)
Pt from home c/o left calf pain that started yesterday. She reports tingling in her left foot. Hx of several DVT's. Pt has beenoff of her coumadin x 3 months. She was placed on Xarelto Thursday. Pt ankle appears to swollen. Good pedal pulse present. She reports this feels the same as previous blood clots.

## 2015-03-16 NOTE — Discharge Instructions (Signed)
Return to the hospital in the morning for a DVT study. Return to the ED sooner with worsening or concerning symptoms.

## 2015-03-16 NOTE — ED Provider Notes (Signed)
CSN: [id number]     Arrival date & time 03/16/15  2232 History   First MD Initiated Contact with Patient 03/16/15 2338     Chief Complaint  Patient presents with  . calf pain      (Consider location/radiation/quality/duration/timing/severity/associated sxs/prior Treatment) HPI Comments: Patient is a 49 year old female with a past medical history of DVT who presents with left lower extremity swelling and pain for the past 2 days. Symptoms started gradually and progressively worsened since the onset. The pain is aching and severe without radiation. Patient reports being on coumadin for previous DVT but had to stop 3 months ago because she lost her health insurance and was unable to follow up with PCP. Patient reports this is exactly how she felt with her previous DVT. Palpation and walking makes the pain worse. No alleviating factors. No other associated symptoms.    Past Medical History  Diagnosis Date  . Depression   . 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   . Arthritis   . Bursitis   . Thrombus   . Bursitis    Past Surgical History  Procedure Laterality Date  . Laser ablation     Family History  Problem Relation Age of Onset  . Pulmonary embolism Mother   . Cancer Mother 48    cervical cancer  . Deep vein thrombosis Father   . Deep vein thrombosis Paternal Aunt   . Deep vein thrombosis Paternal Uncle   . Deep vein thrombosis Daughter   . Pulmonary embolism Daughter   . Deep vein thrombosis Daughter    History  Substance Use Topics  . Smoking status: Current Every Day Smoker -- 1.00 packs/day for 20 years  . Smokeless tobacco: Never Used  . Alcohol Use: No   OB History    No data available     Review of Systems  Constitutional: Negative for fever, chills and fatigue.  HENT: Negative for trouble swallowing.   Eyes: Negative for visual disturbance.   Respiratory: Negative for shortness of breath.   Cardiovascular: Positive for leg swelling. Negative for chest pain and palpitations.  Gastrointestinal: Negative for nausea, vomiting, abdominal pain and diarrhea.  Genitourinary: Negative for dysuria and difficulty urinating.  Musculoskeletal: Positive for myalgias. Negative for arthralgias and neck pain.  Skin: Negative for color change.  Neurological: Negative for dizziness and weakness.  Psychiatric/Behavioral: Negative for dysphoric mood.      Allergies  Shellfish allergy  Home Medications   Prior to Admission medications   Medication Sig Start Date End Date Taking? Authorizing Provider  acetaminophen (TYLENOL) 500 MG tablet Take 1,000 mg by mouth every 6 (six) hours as needed for moderate pain (sore throat).     Historical Provider, MD  clonazePAM (KLONOPIN) 0.5 MG tablet Take 1 tablet (0.5 mg total) by mouth 2 (two) times daily as needed for anxiety. 03/15/15   Janith Lima, MD  EPINEPHrine (EPIPEN) 0.3 mg/0.3 mL IJ SOAJ injection Inject 0.3 mLs (0.3 mg total) into the muscle once as needed. 05/15/14   Janith Lima, MD  gabapentin (NEURONTIN) 100 MG capsule Take 300 mg by mouth at bedtime. For hot flashes    Historical Provider, MD  rivaroxaban (XARELTO) 20 MG TABS tablet Take 1 tablet (20 mg total) by mouth daily with supper. 03/15/15   Janith Lima, MD   There were no  vitals taken for this visit. Physical Exam  Constitutional: She is oriented to person, place, and time. She appears well-developed and well-nourished. No distress.  HENT:  Head: Normocephalic and atraumatic.  Eyes: Conjunctivae are normal.  Neck: Normal range of motion.  Cardiovascular: Normal rate and regular rhythm.  Exam reveals no gallop and no friction rub.   No murmur heard. Pulmonary/Chest: Effort normal and breath sounds normal. She has no wheezes. She has no rales. She exhibits no tenderness.  Abdominal: Soft. She exhibits no distension. There is no  tenderness. There is no rebound.  Musculoskeletal: Normal range of motion.  Left calf tenderness to palpation. Left lower extremity non pitting edema noted.   Neurological: She is alert and oriented to person, place, and time. Coordination normal.  Speech is goal-oriented. Moves limbs without ataxia.   Skin: Skin is warm and dry.  Psychiatric: She has a normal mood and affect. Her behavior is normal.  Nursing note and vitals reviewed.   ED Course  Procedures (including critical care time) Labs Review Labs Reviewed - No data to display  Imaging Review No results found.   EKG Interpretation None      MDM   Final diagnoses:  Swelling of left lower extremity    11:45 PM Patient clinically has a DVT and will be given a dose of lovenox. Patient will have outpatient venous doppler study tomorrow morning. Vitals stable and patient afebrile. Patient denies chest pain/SOB. Patient will have half dose of lovenox due to her recent intake of xarelto.   [address] [place], PA-C 03/17/15 Mila Doce, MD 03/17/15 [phone]

## 2015-03-17 ENCOUNTER — Encounter: Payer: Self-pay | Admitting: Internal Medicine

## 2015-03-17 ENCOUNTER — Ambulatory Visit (HOSPITAL_COMMUNITY)
Admission: RE | Admit: 2015-03-17 | Discharge: 2015-03-17 | Disposition: A | Discharge status: Home / Self Care | Payer: 59 | Source: Ambulatory Visit | Attending: Emergency Medicine | Admitting: Emergency Medicine

## 2015-03-17 DIAGNOSIS — M7989 Other specified soft tissue disorders: Secondary | ICD-10-CM | POA: Insufficient documentation

## 2015-03-17 DIAGNOSIS — Z86718 Personal history of other venous thrombosis and embolism: Secondary | ICD-10-CM | POA: Diagnosis not present

## 2015-03-17 DIAGNOSIS — M79605 Pain in left leg: Secondary | ICD-10-CM | POA: Diagnosis not present

## 2015-03-17 DIAGNOSIS — Z01810 Encounter for preprocedural cardiovascular examination: Secondary | ICD-10-CM | POA: Diagnosis not present

## 2015-03-17 MED ORDER — ENOXAPARIN SODIUM 60 MG/0.6ML ~~LOC~~ SOLN
30.0000 mg | Freq: Once | SUBCUTANEOUS | Status: AC
  Administered 2015-03-17: 30 mg via SUBCUTANEOUS
  Filled 2015-03-17: qty 0.6

## 2015-03-17 NOTE — Progress Notes (Signed)
VASCULAR LAB PRELIMINARY  PRELIMINARY  PRELIMINARY  PRELIMINARY  Left lower extremity venous Doppler completed.    Preliminary report:  There is no DVT or SVT noted in the left lower extremity.  There is sluggish flow noted in the the common femoral and femoral veins, etiology unknown.  Janet Hester, RVT 03/17/2015, 10:35 AM

## 2015-03-17 NOTE — Progress Notes (Signed)
Subjective:  Patient ID: Janet Hester, female    DOB: 1966/04/14  Age: 49 y.o. MRN: [id number]  CC: Annual Exam   HPI [name] presents for a CPX and to restart anticoagulation for hypercoagulable state  - she has had DVT's and has a strong FH of blood clots with complications. She lost insurance and has not been able to buy coumadin for 3 months. She also has a very difficult time with the monitoring and management of coumadin. She complains of intermittent episodes of anxiety and panic, she has taken klonopin before and she responded well to it, she is not willing to take an SSRI.  Outpatient Prescriptions Prior to Visit  Medication Sig Dispense Refill  . acetaminophen (TYLENOL) 500 MG tablet Take 1,000 mg by mouth every 6 (six) hours as needed for moderate pain (sore throat).     Marland Kitchen EPINEPHrine (EPIPEN) 0.3 mg/0.3 mL IJ SOAJ injection Inject 0.3 mLs (0.3 mg total) into the muscle once as needed. 1 Device 1  . gabapentin (NEURONTIN) 100 MG capsule Take 300 mg by mouth at bedtime. For hot flashes    . warfarin (COUMADIN) 6 MG tablet TAKE ONE TAB ONCE DAILY OR AS DIRECTED BY PHYSICIAN (Patient taking differently: Take 3-6 mg by mouth daily. Take 1 tablet (6 mg) by mouth on Mon, Tues, Thurs, Sat, Sun & Take 0.5 tablet (3 mg) by mouth on Wed & Fri.) 30 tablet 2  . acetaminophen-codeine 120-12 MG/5ML solution Take 10 mLs by mouth every 4 (four) hours as needed for moderate pain. (Patient not taking: Reported on 01/05/2015) 120 mL 0  . Guaifenesin 1200 MG TB12 Take 1 tablet (1,200 mg total) by mouth 2 (two) times daily. (Patient not taking: Reported on 01/05/2015) 20 each 0  . Guaifenesin 1200 MG TB12 Take 1 tablet (1,200 mg total) by mouth 2 (two) times daily. (Patient not taking: Reported on 01/05/2015) 20 each 0  . HYDROcodone-acetaminophen (NORCO/VICODIN) 5-325 MG per tablet Take 1 tablet by mouth every 6 (six) hours as needed for moderate pain. (Patient not taking: Reported on 03/15/2015) 8 tablet  0  . promethazine-dextromethorphan (PROMETHAZINE-DM) 6.25-15 MG/5ML syrup Take 5 mLs by mouth 4 (four) times daily as needed for cough. (Patient not taking: Reported on 01/05/2015) 120 mL 0   No facility-administered medications prior to visit.    ROS Review of Systems  Constitutional: Negative.  Negative for fever, chills, diaphoresis, appetite change and fatigue.  HENT: Negative.  Negative for congestion, nosebleeds, postnasal drip, rhinorrhea, sinus pressure, sore throat, trouble swallowing and voice change.   Eyes: Negative.   Respiratory: Negative.  Negative for cough, choking, chest tightness, shortness of breath and stridor.   Cardiovascular: Negative.  Negative for chest pain, palpitations and leg swelling.  Gastrointestinal: Negative.  Negative for nausea, vomiting, abdominal pain, diarrhea, constipation, blood in stool and anal bleeding.  Endocrine: Negative.   Genitourinary: Negative.   Musculoskeletal: Negative.   Skin: Negative.  Negative for pallor and rash.  Allergic/Immunologic: Negative.   Neurological: Negative.  Negative for dizziness, tremors, syncope, light-headedness and numbness.  Hematological: Negative.  Negative for adenopathy. Does not bruise/bleed easily.  Psychiatric/Behavioral: Negative for suicidal ideas, hallucinations, behavioral problems, confusion, sleep disturbance, self-injury, dysphoric mood, decreased concentration and agitation. The patient is nervous/anxious. The patient is not hyperactive.     Objective:  BP 118/78 mmHg  Pulse 81  Temp(Src) 98.4 F (36.9 C) (Oral)  Resp 16  Ht 6' (1.829 m)  Wt 253 lb (114.76 kg)  BMI  34.31 kg/m2  SpO2 99%  BP Readings from Last 3 Encounters:  03/15/15 118/78  01/05/15 121/75  12/13/14 128/73    Wt Readings from Last 3 Encounters:  03/15/15 253 lb (114.76 kg)  06/21/14 247 lb 6.4 oz (112.22 kg)  11/23/13 235 lb (106.595 kg)    Physical Exam  Constitutional: She is oriented to person, place, and  time. She appears well-developed and well-nourished. No distress.  HENT:  Head: Normocephalic and atraumatic.  Mouth/Throat: Oropharynx is clear and moist. No oropharyngeal exudate.  Eyes: Conjunctivae are normal. Right eye exhibits no discharge. Left eye exhibits no discharge. No scleral icterus.  Neck: Normal range of motion. Neck supple. No JVD present. No tracheal deviation present. No thyromegaly present.  Cardiovascular: Normal rate, regular rhythm, normal heart sounds and intact distal pulses.  Exam reveals no gallop and no friction rub.   No murmur heard. Pulmonary/Chest: Effort normal and breath sounds normal. No stridor. No respiratory distress. She has no wheezes. She has no rales. She exhibits no tenderness.  Abdominal: Soft. Bowel sounds are normal. She exhibits no distension and no mass. There is no tenderness. There is no rebound and no guarding.  Musculoskeletal: Normal range of motion. She exhibits no edema or tenderness.  Lymphadenopathy:    She has no cervical adenopathy.  Neurological: She is oriented to person, place, and time.  Skin: Skin is warm and dry. No rash noted. She is not diaphoretic. No erythema. No pallor.  Psychiatric: Her behavior is normal. Judgment and thought content normal. Her mood appears anxious. Her affect is not angry, not blunt, not labile and not inappropriate. Her speech is not rapid and/or pressured. She is not slowed, not withdrawn and not actively hallucinating. Cognition and memory are normal. She does not exhibit a depressed mood. She expresses no homicidal and no suicidal ideation. She expresses no suicidal plans and no homicidal plans. She is attentive.  Vitals reviewed.   Lab Results  Component Value Date   WBC 6.7 03/15/2015   HGB 14.0 03/15/2015   HCT 42.1 03/15/2015   PLT 270.0 03/15/2015   GLUCOSE 87 03/15/2015   CHOL 227* 03/15/2015   TRIG 200.0* 03/15/2015   HDL 37.20* 03/15/2015   LDLCALC 150* 03/15/2015   ALT 17 03/15/2015     AST 22 03/15/2015   NA 137 03/15/2015   K 3.8 03/15/2015   CL 104 03/15/2015   CREATININE 0.81 03/15/2015   BUN 9 03/15/2015   CO2 26 03/15/2015   TSH 1.08 03/15/2015   INR 1.34 01/05/2015    No results found.  Assessment & Plan:   Geneve was seen today for annual exam.  Diagnoses and all orders for this visit:  DVT, lower extremity, distal, unspecified laterality - her d-dimer is not significantly elevated, will start xarelto Orders: -     rivaroxaban (XARELTO) 20 MG TABS tablet; Take 1 tablet (20 mg total) by mouth daily with supper. -     D-dimer, quantitative (not at [facility]); Future  Primary hypercoagulable state - she has been informed by Hematology that she needs lifelong anticoagulation, will start xarelto Orders: -     rivaroxaban (XARELTO) 20 MG TABS tablet; Take 1 tablet (20 mg total) by mouth daily with supper. -     D-dimer, quantitative (not at [facility]); Future  Routine general medical examination at a health care facility - she sees GYN annually, exam done, vaccines were reviewed, labs ordered, she was referred for a mammo Orders: -  Lipid panel; Future -     Comprehensive metabolic panel; Future -     CBC with Differential/Platelet; Future -     TSH; Future -     Urinalysis, Routine w reflex microscopic (not at [facility]); Future  Visit for screening mammogram Orders: -     MM DIGITAL SCREENING BILATERAL; Future  Panic anxiety syndrome Orders: -     clonazePAM (KLONOPIN) 0.5 MG tablet; Take 1 tablet (0.5 mg total) by mouth 2 (two) times daily as needed for anxiety.   I have discontinued Ms. Maret warfarin, Guaifenesin, promethazine-dextromethorphan, Guaifenesin, acetaminophen-codeine, and HYDROcodone-acetaminophen. I am also having her start on rivaroxaban and clonazePAM. Additionally, I am having her maintain her gabapentin, acetaminophen, and EPINEPHrine.  Meds ordered this encounter  Medications  . rivaroxaban (XARELTO) 20 MG TABS tablet    Sig: Take  1 tablet (20 mg total) by mouth daily with supper.    Dispense:  30 tablet    Refill:  5  . clonazePAM (KLONOPIN) 0.5 MG tablet    Sig: Take 1 tablet (0.5 mg total) by mouth 2 (two) times daily as needed for anxiety.    Dispense:  60 tablet    Refill:  2     Follow-up: Return in about 3 months (around 06/15/2015).  Scarlette Calico, MD

## 2015-03-17 NOTE — ED Notes (Signed)
Pt's chart accessed to help [facility], vascular tech with pt's issue

## 2015-03-22 ENCOUNTER — Telehealth: Payer: Self-pay | Admitting: Internal Medicine

## 2015-03-22 DIAGNOSIS — I890 Lymphedema, not elsewhere classified: Secondary | ICD-10-CM | POA: Insufficient documentation

## 2015-03-22 NOTE — Telephone Encounter (Signed)
This is the report, it does not show a blood clot Summary:  - No obvious evidence of deep vein thrombosis involving the left  lower extremity. - There is sluggish flow noted in the left common femoral and  femoral veins. Etiology unknown  The swelling can be from damaged vessels from prior clots Will refer to vascular to see if she needs compression stockings

## 2015-03-22 NOTE — Telephone Encounter (Signed)
Is requesting results of recent labs. Patient states her left ankle and foot is still swollen.  States she spoke with Dr. Ronnald Ramp about this last week.  She went to ER over the weekend and they did an ultra sound and sent to Dr. Ronnald Ramp.  States they found a blood clot in her left groin.  I have scheduled patient to come in for a follow up on 6/14 at 10:30.  Patient would like for Dr. Ronnald Ramp to review ER visit before her appointment.

## 2015-03-26 NOTE — Telephone Encounter (Signed)
No comment

## 2015-03-26 NOTE — Telephone Encounter (Signed)
Patient called back in today.  States that her insurance only allows two visits with PCP a year.  Patient is requesting to not come in for ER follow up tomorrow 6/14.  Please advise patient.

## 2015-03-26 NOTE — Telephone Encounter (Signed)
Patient is calling back again, she would like to know the results of her lab work, she stated that  no one has returned her calls, patient wants to know what's going on. Please advise

## 2015-03-27 ENCOUNTER — Ambulatory Visit: Payer: 59 | Admitting: Internal Medicine

## 2015-03-27 ENCOUNTER — Telehealth: Payer: Self-pay | Admitting: Internal Medicine

## 2015-03-27 ENCOUNTER — Other Ambulatory Visit: Payer: Self-pay | Admitting: *Deleted

## 2015-03-27 DIAGNOSIS — M7989 Other specified soft tissue disorders: Secondary | ICD-10-CM

## 2015-03-27 NOTE — Telephone Encounter (Signed)
Patient no showed for ER fu.  Please advise.

## 2015-03-27 NOTE — Telephone Encounter (Signed)
She has decided not to come in

## 2015-03-28 NOTE — Telephone Encounter (Signed)
Called patient she stated that she has an appointment June 21st @ 10:30 with a vein specialist to see Curt Jews, she will see what they have to say and take it from there.

## 2015-03-30 ENCOUNTER — Encounter: Payer: Self-pay | Admitting: Vascular Surgery

## 2015-04-03 ENCOUNTER — Encounter: Payer: 59 | Admitting: Vascular Surgery

## 2015-04-03 ENCOUNTER — Encounter (HOSPITAL_COMMUNITY): Payer: 59

## 2015-04-05 ENCOUNTER — Ambulatory Visit (HOSPITAL_COMMUNITY)
Admission: RE | Admit: 2015-04-05 | Discharge: 2015-04-05 | Disposition: A | Discharge status: Home / Self Care | Payer: 59 | Source: Ambulatory Visit | Attending: Internal Medicine | Admitting: Internal Medicine

## 2015-04-05 DIAGNOSIS — Z1231 Encounter for screening mammogram for malignant neoplasm of breast: Secondary | ICD-10-CM | POA: Diagnosis not present

## 2015-04-07 LAB — HM MAMMOGRAPHY: HM Mammogram: ABNORMAL

## 2015-04-07 NOTE — Addendum Note (Signed)
Addended by: Janith Lima on: 04/07/2015 10:57 AM   Modules accepted: Miquel Dunn

## 2015-04-09 ENCOUNTER — Other Ambulatory Visit: Payer: Self-pay | Admitting: Internal Medicine

## 2015-04-09 DIAGNOSIS — R928 Other abnormal and inconclusive findings on diagnostic imaging of breast: Secondary | ICD-10-CM

## 2015-04-13 ENCOUNTER — Ambulatory Visit
Admission: RE | Admit: 2015-04-13 | Discharge: 2015-04-13 | Disposition: A | Discharge status: Home / Self Care | Payer: 59 | Source: Ambulatory Visit | Attending: Internal Medicine | Admitting: Internal Medicine

## 2015-04-13 DIAGNOSIS — R928 Other abnormal and inconclusive findings on diagnostic imaging of breast: Secondary | ICD-10-CM

## 2015-04-13 LAB — HM MAMMOGRAPHY: HM Mammogram: ABNORMAL

## 2015-04-14 NOTE — Addendum Note (Signed)
Addended by: Janith Lima on: 04/14/2015 11:08 AM   Modules accepted: Miquel Dunn

## 2015-04-23 ENCOUNTER — Encounter: Payer: Self-pay | Admitting: Pharmacist

## 2015-04-23 NOTE — Progress Notes (Signed)
Pt is now seeing Dr. Ronnald Ramp. Coumadin was discontinued at his office visit on 03/15/15. Will archive patient from coumadin clinic.

## 2015-05-18 ENCOUNTER — Other Ambulatory Visit: Payer: Self-pay | Admitting: *Deleted

## 2015-05-18 ENCOUNTER — Telehealth: Payer: Self-pay | Admitting: *Deleted

## 2015-05-18 MED ORDER — TRAMADOL HCL 50 MG PO TABS: 50.0000 mg | ORAL_TABLET | Freq: Four times a day (QID) | ORAL | Status: DC | PRN

## 2015-05-18 NOTE — Telephone Encounter (Signed)
Received call pt states her bursitis has flared up again in her hip. She would like refill on her tramadol 50 mg that md rx before. MD is out of office is this ok to refill...Johny Chess

## 2015-05-18 NOTE — Telephone Encounter (Signed)
Patient called to follow up. Advised her provider is out of office and we need to allow more time for dr hopper to review/ approve

## 2015-05-18 NOTE — Telephone Encounter (Signed)
Called pharmacy had to leave msg on pharmacist vm. Notifed pt med has been call to pharmacy...Janet Hester

## 2015-05-18 NOTE — Telephone Encounter (Signed)
OK #[phone] 

## 2015-06-27 ENCOUNTER — Other Ambulatory Visit: Payer: Self-pay | Admitting: Internal Medicine

## 2015-06-28 ENCOUNTER — Other Ambulatory Visit: Payer: Self-pay | Admitting: Internal Medicine

## 2015-06-28 DIAGNOSIS — B001 Herpesviral vesicular dermatitis: Secondary | ICD-10-CM

## 2015-06-28 MED ORDER — VALACYCLOVIR HCL 1 G PO TABS: 1000.0000 mg | ORAL_TABLET | Freq: Three times a day (TID) | ORAL | Status: DC

## 2015-06-28 NOTE — Telephone Encounter (Signed)
Patient states that her shingles has flared up again. She is asking for a fill of valACYclovir (VALTREX) 1000 MG tablet from 11/23/2013 visit. She declined an O/V and stated that she wants to try and get the meds first before getting an OV.   She is using [facility] on [address]

## 2015-06-28 NOTE — Telephone Encounter (Signed)
Pt called back requesting status on valtrex inform pt md ok valtrex rx has been sent back to [facility]...Janet Hester

## 2015-06-28 NOTE — Telephone Encounter (Signed)
Ok to rf? Or OV needed?

## 2015-06-29 ENCOUNTER — Encounter (HOSPITAL_COMMUNITY): Payer: Self-pay | Admitting: Emergency Medicine

## 2015-06-29 ENCOUNTER — Emergency Department (HOSPITAL_COMMUNITY)
Admission: EM | Admit: 2015-06-29 | Discharge: 2015-06-29 | Disposition: A | Discharge status: Home / Self Care | Payer: 59 | Attending: Emergency Medicine | Admitting: Emergency Medicine

## 2015-06-29 DIAGNOSIS — B029 Zoster without complications: Secondary | ICD-10-CM | POA: Insufficient documentation

## 2015-06-29 DIAGNOSIS — F329 Major depressive disorder, single episode, unspecified: Secondary | ICD-10-CM | POA: Insufficient documentation

## 2015-06-29 DIAGNOSIS — Z8739 Personal history of other diseases of the musculoskeletal system and connective tissue: Secondary | ICD-10-CM | POA: Insufficient documentation

## 2015-06-29 DIAGNOSIS — Z7901 Long term (current) use of anticoagulants: Secondary | ICD-10-CM | POA: Insufficient documentation

## 2015-06-29 DIAGNOSIS — H6121 Impacted cerumen, right ear: Secondary | ICD-10-CM

## 2015-06-29 DIAGNOSIS — Z720 Tobacco use: Secondary | ICD-10-CM | POA: Insufficient documentation

## 2015-06-29 DIAGNOSIS — H6122 Impacted cerumen, left ear: Secondary | ICD-10-CM | POA: Insufficient documentation

## 2015-06-29 DIAGNOSIS — Z86718 Personal history of other venous thrombosis and embolism: Secondary | ICD-10-CM | POA: Insufficient documentation

## 2015-06-29 MED ORDER — ACYCLOVIR 400 MG PO TABS: 400.0000 mg | ORAL_TABLET | Freq: Every day | ORAL | Status: DC

## 2015-06-29 MED ORDER — OXYCODONE-ACETAMINOPHEN 5-325 MG PO TABS
2.0000 | ORAL_TABLET | Freq: Once | ORAL | Status: AC
  Administered 2015-06-29: 2 via ORAL
  Filled 2015-06-29: qty 2

## 2015-06-29 MED ORDER — OXYCODONE-ACETAMINOPHEN 5-325 MG PO TABS: 1.0000 | ORAL_TABLET | ORAL | Status: DC | PRN

## 2015-06-29 MED ORDER — CARBAMIDE PEROXIDE 6.5 % OT SOLN: 5.0000 [drp] | Freq: Two times a day (BID) | OTIC | Status: DC

## 2015-06-29 NOTE — Discharge Instructions (Signed)
Read the information below.  Use the prescribed medication as directed.  Please discuss all new medications with your pharmacist.  Do not take additional tylenol while taking the prescribed pain medication to avoid overdose.  You may return to the Emergency Department at any time for worsening condition or any new symptoms that concern you.   If you develop fevers, uncontrolled pain, cough, shortness of breath, uncontrolled vomiting, or difficulty breathing or swallowing return to the ER for a recheck.     Shingles Shingles (herpes zoster) is an infection that is caused by the same virus that causes chickenpox (varicella). The infection causes a painful skin rash and fluid-filled blisters, which eventually break open, crust over, and heal. It may occur in any area of the body, but it usually affects only one side of the body or face. The pain of shingles usually lasts about 1 month. However, some people with shingles may develop long-term (chronic) pain in the affected area of the body. Shingles often occurs many years after the person had chickenpox. It is more common:  In people older than 50 years.  In people with weakened immune systems, such as those with HIV, AIDS, or cancer.  In people taking medicines that weaken the immune system, such as transplant medicines.  In people under great stress. CAUSES  Shingles is caused by the varicella zoster virus (VZV), which also causes chickenpox. After a person is infected with the virus, it can remain in the person's body for years in an inactive state (dormant). To cause shingles, the virus reactivates and breaks out as an infection in a nerve root. The virus can be spread from person to person (contagious) through contact with open blisters of the shingles rash. It will only spread to people who have not had chickenpox. When these people are exposed to the virus, they may develop chickenpox. They will not develop shingles. Once the blisters scab over,  the person is no longer contagious and cannot spread the virus to others. SIGNS AND SYMPTOMS  Shingles shows up in stages. The initial symptoms may be pain, itching, and tingling in an area of the skin. This pain is usually described as burning, stabbing, or throbbing.In a few days or weeks, a painful red rash will appear in the area where the pain, itching, and tingling were felt. The rash is usually on one side of the body in a band or belt-like pattern. Then, the rash usually turns into fluid-filled blisters. They will scab over and dry up in approximately 2-3 weeks. Flu-like symptoms may also occur with the initial symptoms, the rash, or the blisters. These may include:  Fever.  Chills.  Headache.  Upset stomach. DIAGNOSIS  Your health care provider will perform a skin exam to diagnose shingles. Skin scrapings or fluid samples may also be taken from the blisters. This sample will be examined under a microscope or sent to a lab for further testing. TREATMENT  There is no specific cure for shingles. Your health care provider will likely prescribe medicines to help you manage the pain, recover faster, and avoid long-term problems. This may include antiviral drugs, anti-inflammatory drugs, and pain medicines. HOME CARE INSTRUCTIONS   Take a cool bath or apply cool compresses to the area of the rash or blisters as directed. This may help with the pain and itching.   Take medicines only as directed by your health care provider.   Rest as directed by your health care provider.  Keep your rash and  blisters clean with mild soap and cool water or as directed by your health care provider.  Do not pick your blisters or scratch your rash. Apply an anti-itch cream or numbing creams to the affected area as directed by your health care provider.  Keep your shingles rash covered with a loose bandage (dressing).  Avoid skin contact with:  Babies.   Pregnant women.   Children with eczema.    Elderly people with transplants.   People with chronic illnesses, such as leukemia or AIDS.   Wear loose-fitting clothing to help ease the pain of material rubbing against the rash.  Keep all follow-up visits as directed by your health care provider.If the area involved is on your face, you may receive a referral for a specialist, such as an eye doctor (ophthalmologist) or an ear, nose, and throat (ENT) doctor. Keeping all follow-up visits will help you avoid eye problems, chronic pain, or disability.  SEEK IMMEDIATE MEDICAL CARE IF:   You have facial pain, pain around the eye area, or loss of feeling on one side of your face.  You have ear pain or ringing in your ear.  You have loss of taste.  Your pain is not relieved with prescribed medicines.   Your redness or swelling spreads.   You have more pain and swelling.  Your condition is worsening or has changed.   You have a fever. MAKE SURE YOU:  Understand these instructions.  Will watch your condition.  Will get help right away if you are not doing well or get worse. Document Released: 09/29/2005 Document Revised: 02/13/2014 Document Reviewed: 05/13/2012 [facility] Patient Information 2015 [place], [place]. This information is not intended to replace advice given to you by your health care provider. Make sure you discuss any questions you have with your health care provider.  Cerumen Impaction A cerumen impaction is when the wax in your ear forms a plug. This plug usually causes reduced hearing. Sometimes it also causes an earache or dizziness. Removing a cerumen impaction can be difficult and painful. The wax sticks to the ear canal. The canal is sensitive and bleeds easily. If you try to remove a heavy wax buildup with a cotton tipped swab, you may push it in further. Irrigation with water, suction, and small ear curettes may be used to clear out the wax. If the impaction is fixed to the skin in the ear canal, ear  drops may be needed for a few days to loosen the wax. People who build up a lot of wax frequently can use ear wax removal products available in your local drugstore. SEEK MEDICAL CARE IF:  You develop an earache, increased hearing loss, or marked dizziness. Document Released: 11/06/2004 Document Revised: 12/22/2011 Document Reviewed: 12/27/2009 [facility] Patient Information 2015 [place], [place]. This information is not intended to replace advice given to you by your health care provider. Make sure you discuss any questions you have with your health care provider.

## 2015-06-29 NOTE — ED Provider Notes (Signed)
CSN: [id number]     Arrival date & time 06/29/15  0827 History   First MD Initiated Contact with Patient 06/29/15 [phone]     Chief Complaint  Patient presents with  . Herpes Zoster     (Consider location/radiation/quality/duration/timing/severity/associated sxs/prior Treatment) HPI   Pt presents with painful rash that starts in her right upper back and wraps around to her right chest wall.  The pain began 3 days ago as a mild discomfort and is now a severe throbbing, sharp, burning pain.  It is causing her anxiety.  She also notes some mild discomfort in her right ear.  Denies fevers, cough, SOB.  This is her 5th episode of shingles.  She has not had the singles vaccination.   Pt spoke with her doctor's office this week and Valtrex was called in for her but she states she does not have insurance card and she states it is over $200.  Has only taken tylenol for pain.   Past Medical History  Diagnosis Date  . Depression   . 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   . Arthritis   . Bursitis   . Thrombus   . Bursitis    Past Surgical History  Procedure Laterality Date  . Laser ablation     Family History  Problem Relation Age of Onset  . Pulmonary embolism Mother   . Cancer Mother 69    cervical cancer  . Deep vein thrombosis Father   . Deep vein thrombosis Paternal Aunt   . Deep vein thrombosis Paternal Uncle   . Deep vein thrombosis Daughter   . Pulmonary embolism Daughter   . Deep vein thrombosis Daughter    Social History  Substance Use Topics  . Smoking status: Current Every Day Smoker -- 1.00 packs/day for 20 years  . Smokeless tobacco: Never Used  . Alcohol Use: No   OB History    No data available     Review of Systems  All other systems reviewed and are negative.     Allergies  Shellfish allergy  Home Medications   Prior to Admission  medications   Medication Sig Start Date End Date Taking? Authorizing Provider  acetaminophen (TYLENOL) 500 MG tablet Take 1,000 mg by mouth every 6 (six) hours as needed for moderate pain (sore throat).     Historical Provider, MD  clonazePAM (KLONOPIN) 0.5 MG tablet TAKE ONE TABLET BY MOUTH TWICE DAILY AS NEEDED FOR ANXIETY 06/27/15   Janith Lima, MD  EPINEPHrine (EPIPEN) 0.3 mg/0.3 mL IJ SOAJ injection Inject 0.3 mLs (0.3 mg total) into the muscle once as needed. 05/15/14   Janith Lima, MD  gabapentin (NEURONTIN) 100 MG capsule Take 300 mg by mouth at bedtime. For hot flashes    Historical Provider, MD  rivaroxaban (XARELTO) 20 MG TABS tablet Take 1 tablet (20 mg total) by mouth daily with supper. 03/15/15   Janith Lima, MD  traMADol (ULTRAM) 50 MG tablet Take 1 tablet (50 mg total) by mouth every 6 (six) hours as needed for moderate pain (pain). 05/18/15   Hendricks Limes, MD  valACYclovir (VALTREX) 1000 MG tablet Take 1 tablet (1,000 mg total) by mouth 3 (three) times daily. 06/28/15   Janith Lima, MD   BP 146/95 mmHg  Pulse 78  Temp(Src) 97.9 F (36.6 C) (Oral)  Resp 18  SpO2 100% Physical Exam  Constitutional: She appears well-developed and well-nourished. No distress.  HENT:  Head: Normocephalic and atraumatic.  Left Ear: Tympanic membrane and ear canal normal.  Mouth/Throat: Oropharynx is clear and moist.  Right ear canal with large amount of dark cerumen  Eyes: Conjunctivae are normal.  Neck: Normal range of motion. Neck supple.  Cardiovascular: Normal rate and regular rhythm.   Pulmonary/Chest: Effort normal and breath sounds normal. No respiratory distress. She has no wheezes. She has no rales.  Neurological: She is alert.  Skin: She is not diaphoretic.     Nursing note and vitals reviewed.   ED Course  Procedures (including critical care time) Labs Review Labs Reviewed - No data to display  Imaging Review No results found. I have personally reviewed and  evaluated these images and lab results as part of my medical decision-making.   EKG Interpretation None      MDM   Final diagnoses:  Herpes zoster  Excessive cerumen in right ear canal    Afebrile, nontoxic patient with rash on right upper back/chest wall c/w herpes zoster.  She has had this several times before, is followed by her PCP Dr Ronnald Ramp at [place]. No systemic symptoms or other concerns.  Valtrex was called in for her but she cannot afford it.  Only reports tylenol at home for pain.   D/C home with percocet, acyclovir (less expensive), PCP follow up.  Discussed result, findings, treatment, and follow up  with patient.  Pt given return precautions.  Pt verbalizes understanding and agrees with plan.         Clayton Bibles, PA-C 06/29/15 1139  Pattricia Boss, MD 06/29/15 [phone]

## 2015-06-29 NOTE — ED Notes (Signed)
Patient states rash to back that wraps around side to torso.  Patient states no fever.  Patient states pain with rash.

## 2015-07-02 ENCOUNTER — Emergency Department (HOSPITAL_COMMUNITY)
Admission: EM | Admit: 2015-07-02 | Discharge: 2015-07-02 | Disposition: A | Discharge status: Home / Self Care | Payer: 59 | Attending: Emergency Medicine | Admitting: Emergency Medicine

## 2015-07-02 ENCOUNTER — Encounter (HOSPITAL_COMMUNITY): Payer: Self-pay | Admitting: Emergency Medicine

## 2015-07-02 DIAGNOSIS — Z720 Tobacco use: Secondary | ICD-10-CM | POA: Insufficient documentation

## 2015-07-02 DIAGNOSIS — M1990 Unspecified osteoarthritis, unspecified site: Secondary | ICD-10-CM | POA: Insufficient documentation

## 2015-07-02 DIAGNOSIS — B029 Zoster without complications: Secondary | ICD-10-CM | POA: Insufficient documentation

## 2015-07-02 DIAGNOSIS — F329 Major depressive disorder, single episode, unspecified: Secondary | ICD-10-CM | POA: Insufficient documentation

## 2015-07-02 DIAGNOSIS — Z86718 Personal history of other venous thrombosis and embolism: Secondary | ICD-10-CM | POA: Insufficient documentation

## 2015-07-02 DIAGNOSIS — Z79899 Other long term (current) drug therapy: Secondary | ICD-10-CM | POA: Insufficient documentation

## 2015-07-02 HISTORY — DX: Zoster without complications: B02.9

## 2015-07-02 MED ORDER — IBUPROFEN 800 MG PO TABS: 800.0000 mg | ORAL_TABLET | Freq: Three times a day (TID) | ORAL | Status: DC

## 2015-07-02 NOTE — ED Notes (Signed)
Pt dx with shingles and has finished antivirals. Still blisters present without scabs. Pt wants to be able to go back to work.

## 2015-07-02 NOTE — ED Provider Notes (Signed)
CSN: [id number]     Arrival date & time 07/02/15  1696 History  This chart was scribed for non-physician practitioner, Linus Mako, working with Harvel Quale, MD by Evelene Croon, ED Scribe. This patient was seen in room TR06C/TR06C and the patient's care was started at 9:24 AM.    Chief Complaint  Patient presents with  . Herpes Zoster    The history is provided by the patient. No language interpreter was used.   HPI Comments:  Janet Hester is a 49 y.o. female who presents to the Emergency Department complaining of lingering nerve pain to her back. Pt was recently diagnosed with shingles for the 5th time and has already been evaluated for her symptom. She was placed on antivirals which she has been taking for 3 days. Pt states she came to the ED today because she needs a note stating that she can return to work. She notes she has been taking percocet with little relief. Pt has no new symptoms or complaints at this time.   Past Medical History  Diagnosis Date  . Depression   . 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   . Arthritis   . Bursitis   . Thrombus   . Bursitis   . Shingles    Past Surgical History  Procedure Laterality Date  . Laser ablation     Family History  Problem Relation Age of Onset  . Pulmonary embolism Mother   . Cancer Mother 31    cervical cancer  . Deep vein thrombosis Father   . Deep vein thrombosis Paternal Aunt   . Deep vein thrombosis Paternal Uncle   . Deep vein thrombosis Daughter   . Pulmonary embolism Daughter   . Deep vein thrombosis Daughter    Social History  Substance Use Topics  . Smoking status: Current Every Day Smoker -- 1.00 packs/day for 20 years  . Smokeless tobacco: Never Used  . Alcohol Use: No   OB History    No data available     Review of Systems  Constitutional: Negative for fever and chills.   Musculoskeletal: Positive for myalgias.  Skin: Positive for rash.    Allergies  Shellfish allergy  Home Medications   Prior to Admission medications   Medication Sig Start Date End Date Taking? Authorizing Provider  acetaminophen (TYLENOL) 500 MG tablet Take 1,000 mg by mouth every 6 (six) hours as needed for moderate pain (sore throat).     Historical Provider, MD  acyclovir (ZOVIRAX) 400 MG tablet Take 1 tablet (400 mg total) by mouth 5 (five) times daily. 06/29/15   Clayton Bibles, PA-C  carbamide peroxide (DEBROX) 6.5 % otic solution Place 5 drops into the right ear 2 (two) times daily. As needed for ear wax removal 06/29/15   Clayton Bibles, PA-C  clonazePAM (KLONOPIN) 0.5 MG tablet TAKE ONE TABLET BY MOUTH TWICE DAILY AS NEEDED FOR ANXIETY 06/27/15   Janith Lima, MD  EPINEPHrine (EPIPEN) 0.3 mg/0.3 mL IJ SOAJ injection Inject 0.3 mLs (0.3 mg total) into the muscle once as needed. 05/15/14   Janith Lima, MD  gabapentin (NEURONTIN) 100 MG capsule Take 300 mg by mouth at bedtime. For hot flashes    Historical Provider, MD  ibuprofen (ADVIL,MOTRIN) 800 MG tablet Take 1 tablet (800 mg total) by mouth 3 (three) times daily. 07/02/15   Tiffany  Carlota Raspberry, PA-C  oxyCODONE-acetaminophen (PERCOCET/ROXICET) 5-325 MG per tablet Take 1-2 tablets by mouth every 4 (four) hours as needed for severe pain. 06/29/15   Clayton Bibles, PA-C  rivaroxaban (XARELTO) 20 MG TABS tablet Take 1 tablet (20 mg total) by mouth daily with supper. 03/15/15   Janith Lima, MD  traMADol (ULTRAM) 50 MG tablet Take 1 tablet (50 mg total) by mouth every 6 (six) hours as needed for moderate pain (pain). 05/18/15   Hendricks Limes, MD  valACYclovir (VALTREX) 1000 MG tablet Take 1 tablet (1,000 mg total) by mouth 3 (three) times daily. 06/28/15   Janith Lima, MD   BP 150/82 mmHg  Pulse 98  Temp(Src) 98.6 F (37 C) (Oral)  Resp 16  SpO2 99% Physical Exam  Constitutional: She is oriented to person, place, and time. She appears  well-developed and well-nourished. No distress.  HENT:  Head: Normocephalic and atraumatic.  Eyes: Conjunctivae are normal.  Cardiovascular: Normal rate.   Pulmonary/Chest: Effort normal.  Abdominal: She exhibits no distension.  Neurological: She is alert and oriented to person, place, and time.  Skin: Skin is warm and dry.  Large cluster of shingles ~ 2 x 6 cm over right tricep, covered in calamine lotion with a mix of blister and scabs. The area is excoriated and tender to touch  No induration or erythema associated   Psychiatric: She has a normal mood and affect.  Nursing note and vitals reviewed.   ED Course  Procedures   DIAGNOSTIC STUDIES:  Oxygen Saturation is 99% on RA, normal by my interpretation.    COORDINATION OF CARE:  9:28 AM Will discharge with ibuprofen for pain and return to work note. Discussed treatment plan with pt at bedside and pt agreed to plan.  Labs Review Labs Reviewed - No data to display  Imaging Review No results found. I have personally reviewed and evaluated these images and lab results as part of my medical decision-making.   EKG Interpretation None      MDM   Final diagnoses:  Shingles   I spoke with on-call provider at our infectious disease clinic. He does not recommend more antivirals as he does not believe that they will help. So long as rash is covered pt can return to work. Pt given return to work note, precautions and return to ED precautions. No systemic symptoms, pt is well appearing.  Medications - No data to display  49 y.o.Janet Hester's evaluation in the Emergency Department is complete. It has been determined that no acute conditions requiring further emergency intervention are present at this time. The patient/guardian have been advised of the diagnosis and plan. We have discussed signs and symptoms that warrant return to the ED, such as changes or worsening in symptoms.  Vital signs are stable at discharge. Filed  Vitals:   07/02/15 0913  BP: 150/82  Pulse: 98  Temp: 98.6 F (37 C)  Resp: 16    Patient/guardian has voiced understanding and agreed to follow-up with the PCP or specialist.  I personally performed the services described in this documentation, which was scribed in my presence. The recorded information has been reviewed and is accurate.    Delos Haring, PA-C 07/06/15 [place], MD 07/06/15 [phone]

## 2015-07-02 NOTE — ED Notes (Signed)
Declined W/C at D/C and was escorted to lobby by RN. 

## 2015-07-02 NOTE — Discharge Instructions (Signed)

## 2015-07-31 ENCOUNTER — Telehealth: Payer: Self-pay | Admitting: Internal Medicine

## 2015-07-31 ENCOUNTER — Encounter: Payer: Self-pay | Admitting: Internal Medicine

## 2015-07-31 NOTE — Telephone Encounter (Signed)
ok 

## 2015-07-31 NOTE — Telephone Encounter (Signed)
Patient is requesting to get shingles vaccine because of her HX of shingles. She states that her insurance will cover 100% if administered here. Please advise

## 2015-08-01 NOTE — Telephone Encounter (Signed)
Called and scheduled

## 2015-08-07 ENCOUNTER — Other Ambulatory Visit: Payer: Self-pay | Admitting: Internal Medicine

## 2015-08-08 ENCOUNTER — Ambulatory Visit (INDEPENDENT_AMBULATORY_CARE_PROVIDER_SITE_OTHER): Payer: PRIVATE HEALTH INSURANCE | Admitting: *Deleted

## 2015-08-08 ENCOUNTER — Telehealth: Payer: Self-pay | Admitting: *Deleted

## 2015-08-08 DIAGNOSIS — Z23 Encounter for immunization: Secondary | ICD-10-CM | POA: Diagnosis not present

## 2015-08-08 NOTE — Telephone Encounter (Signed)
Pt states she is not taking xanax nor seeing a psych. Per chart refill was approved yesterday & fax to [facility]. Will contact pharmacy to see if they received script,,,/lmb

## 2015-08-08 NOTE — Telephone Encounter (Signed)
It looks like her psych is prescribing xanax

## 2015-08-08 NOTE — Telephone Encounter (Signed)
Pt is requesting refill on her clonazepam.../lmb

## 2015-08-09 NOTE — Telephone Encounter (Signed)
Per tech @ pharmacy they did receive the fax from [facility] its waiting for pickup...Johny Chess

## 2015-09-21 ENCOUNTER — Telehealth: Payer: Self-pay | Admitting: *Deleted

## 2015-09-21 ENCOUNTER — Ambulatory Visit (INDEPENDENT_AMBULATORY_CARE_PROVIDER_SITE_OTHER): Payer: PRIVATE HEALTH INSURANCE | Admitting: Internal Medicine

## 2015-09-21 ENCOUNTER — Encounter: Payer: Self-pay | Admitting: Internal Medicine

## 2015-09-21 VITALS — BP 138/88 | Ht 71.0 in | Wt 252.2 lb

## 2015-09-21 DIAGNOSIS — Z86718 Personal history of other venous thrombosis and embolism: Secondary | ICD-10-CM

## 2015-09-21 DIAGNOSIS — Z1211 Encounter for screening for malignant neoplasm of colon: Secondary | ICD-10-CM

## 2015-09-21 DIAGNOSIS — Z7901 Long term (current) use of anticoagulants: Secondary | ICD-10-CM

## 2015-09-21 MED ORDER — NA SULFATE-K SULFATE-MG SULF 17.5-3.13-1.6 GM/177ML PO SOLN: ORAL | Status: DC

## 2015-09-21 NOTE — Telephone Encounter (Signed)
09/21/2015 RE: Janet Hester DOB: September 13, 1966 MRN: [id number]  Dear Dr Ronnald Ramp,   We have scheduled the above patient for a colonoscopy procedure. Our records show that she is on anticoagulation therapy.  Please advise as to whether the patient may come off her therapy of Xarelto 2 days prior to the procedure, which is scheduled for 11/08/15 Please route your response to Dixon Boos, [place].   Sincerely,  Dixon Boos

## 2015-09-21 NOTE — Patient Instructions (Addendum)
You have been scheduled for a colonoscopy. Please follow written instructions given to you at your visit today.  Please pick up your prep supplies at the pharmacy within the next 1-3 days. If you use inhalers (even only as needed), please bring them with you on the day of your procedure.  We have sent the following medications to your pharmacy for you to pick up at your convenience: [facility] will be contacted by our office prior to your procedure for directions on holding your xarelto.  If you do not hear from our office 1 week prior to your scheduled procedure, please call [phone] to discuss.

## 2015-09-21 NOTE — Progress Notes (Signed)
Patient ID: Janet Hester, female   DOB: Sep 25, 1966, 49 y.o.   MRN: [id number] HPI: Dalores Shytle is a 49 year old female with history of DVT on Xarelto, family history of blood clots who is seen in consultation at the request of Dr. Ronnald Ramp for consideration of screening colonoscopy. She's here today with her 2 daughters. She reports from a bowel standpoint she is regular. She denies diarrhea or constipation. No abdominal pain. No blood in stool or melena. She denies ability complaint. Reports good appetite, stable weight. No dysphagia or odynophagia. No trouble with heartburn or dyspepsia. She has never had screening colonoscopy. No family history of colon polyps or cancer to her knowledge.  She's been on Xarelto since developing blood clots in the right lower extremity. Father also has a history of DVT in her father and daughters. Dr. Ronnald Ramp manages her Xarelto though she has seen Dr. Lamonte Sakai and Dr. Alvy Bimler with hematology.  Past Medical History  Diagnosis Date  . Depression   . DVT, lower extremity, distal ([place]) 05/04/2012    DVT in a branch off of LEFT prox popliteal vein, mid posterior tibial vein, and upper calf of the peroneal vein.   . Arthritis   . Bursitis   . Thrombus   . Bursitis   . Shingles     Past Surgical History  Procedure Laterality Date  . Laser ablation      Outpatient Prescriptions Prior to Visit  Medication Sig Dispense Refill  . acetaminophen (TYLENOL) 500 MG tablet Take 1,000 mg by mouth every 6 (six) hours as needed for moderate pain (sore throat).     . clonazePAM (KLONOPIN) 0.5 MG tablet TAKE ONE TABLET BY MOUTH TWICE DAILY AS NEEDED FOR ANXIETY 60 tablet 0  . EPINEPHrine (EPIPEN) 0.3 mg/0.3 mL IJ SOAJ injection Inject 0.3 mLs (0.3 mg total) into the muscle once as needed. 1 Device 1  . gabapentin (NEURONTIN) 100 MG capsule Take 300 mg by mouth at bedtime. For hot flashes    . ibuprofen (ADVIL,MOTRIN) 800 MG tablet Take 1 tablet (800 mg total) by mouth 3 (three) times  daily. 21 tablet 0  . oxyCODONE-acetaminophen (PERCOCET/ROXICET) 5-325 MG per tablet Take 1-2 tablets by mouth every 4 (four) hours as needed for severe pain. 20 tablet 0  . rivaroxaban (XARELTO) 20 MG TABS tablet Take 1 tablet (20 mg total) by mouth daily with supper. 30 tablet 5  . acyclovir (ZOVIRAX) 400 MG tablet Take 1 tablet (400 mg total) by mouth 5 (five) times daily. 25 tablet 0  . carbamide peroxide (DEBROX) 6.5 % otic solution Place 5 drops into the right ear 2 (two) times daily. As needed for ear wax removal 15 mL 0  . traMADol (ULTRAM) 50 MG tablet Take 1 tablet (50 mg total) by mouth every 6 (six) hours as needed for moderate pain (pain). 30 tablet 0  . valACYclovir (VALTREX) 1000 MG tablet Take 1 tablet (1,000 mg total) by mouth 3 (three) times daily. 21 tablet 3   No facility-administered medications prior to visit.    Allergies  Allergen Reactions  . Shellfish Allergy Anaphylaxis    Family History  Problem Relation Age of Onset  . Pulmonary embolism Mother   . Cancer Mother 75    cervical cancer  . Deep vein thrombosis Father   . Deep vein thrombosis Paternal Aunt   . Deep vein thrombosis Paternal Uncle   . Deep vein thrombosis Daughter   . Pulmonary embolism Daughter   . Deep vein thrombosis  Daughter     Social History  Substance Use Topics  . Smoking status: Current Every Day Smoker -- 1.00 packs/day for 20 years  . Smokeless tobacco: Never Used  . Alcohol Use: No    ROS: As per history of present illness, otherwise negative  BP 138/88 mmHg  Ht 5\' 11"  (1.803 m)  Wt 252 lb 3.2 oz (114.397 kg)  BMI 35.19 kg/m2 Constitutional: Well-developed and well-nourished. No distress. HEENT: Normocephalic and atraumatic. Oropharynx is clear and moist. No oropharyngeal exudate. Conjunctivae are normal.  No scleral icterus. Neck: Neck supple. Trachea midline. Cardiovascular: Normal rate, regular rhythm and intact distal pulses. No M/R/G Pulmonary/chest: Effort normal  and breath sounds normal. No wheezing, rales or rhonchi. Abdominal: Soft, nontender, nondistended. Bowel sounds active throughout. There are no masses palpable. No hepatosplenomegaly. Extremities: no clubbing, cyanosis, or edema Lymphadenopathy: No cervical adenopathy noted. Neurological: Alert and oriented to person place and time. Skin: Skin is warm and dry. No rashes noted. Psychiatric: Normal mood and affect. Behavior is normal.  RELEVANT LABS AND IMAGING: CBC    Component Value Date/Time   WBC 6.7 03/15/2015 0908   WBC 5.5 02/16/2014 0818   RBC 4.68 03/15/2015 0908   RBC 4.31 02/16/2014 0818   RBC 4.19 05/16/2010 0825   HGB 14.0 03/15/2015 0908   HGB 13.5 02/16/2014 0818   HCT 42.1 03/15/2015 0908   HCT 39.2 02/16/2014 0818   PLT 270.0 03/15/2015 0908   PLT 253 02/16/2014 0818   MCV 89.9 03/15/2015 0908   MCV 91.0 02/16/2014 0818   MCH 31.3 02/16/2014 0818   MCH 30.9 12/11/2012 1924   MCHC 33.3 03/15/2015 0908   MCHC 34.4 02/16/2014 0818   RDW 13.4 03/15/2015 0908   RDW 13.6 02/16/2014 0818   LYMPHSABS 2.1 03/15/2015 0908   LYMPHSABS 2.0 02/16/2014 0818   MONOABS 0.5 03/15/2015 0908   MONOABS 0.4 02/16/2014 0818   EOSABS 0.1 03/15/2015 0908   EOSABS 0.1 02/16/2014 0818   BASOSABS 0.0 03/15/2015 0908   BASOSABS 0.0 02/16/2014 0818    CMP     Component Value Date/Time   NA 137 03/15/2015 0908   NA 138 11/18/2013 0816   K 3.8 03/15/2015 0908   K 3.8 11/18/2013 0816   CL 104 03/15/2015 0908   CO2 26 03/15/2015 0908   CO2 21* 11/18/2013 0816   GLUCOSE 87 03/15/2015 0908   GLUCOSE 97 11/18/2013 0816   BUN 9 03/15/2015 0908   BUN 7.9 11/18/2013 0816   CREATININE 0.81 03/15/2015 0908   CREATININE 0.8 11/18/2013 0816   CALCIUM 9.7 03/15/2015 0908   CALCIUM 9.3 11/18/2013 0816   PROT 7.8 03/15/2015 0908   PROT 7.7 11/18/2013 0816   ALBUMIN 4.2 03/15/2015 0908   ALBUMIN 4.1 11/18/2013 0816   AST 22 03/15/2015 0908   AST 22 11/18/2013 0816   ALT 17 03/15/2015  0908   ALT 17 11/18/2013 0816   ALKPHOS 54 03/15/2015 0908   ALKPHOS 45 11/18/2013 0816   BILITOT 0.7 03/15/2015 0908   BILITOT 0.69 11/18/2013 0816   GFRNONAA >90 12/11/2012 1924   GFRAA >90 12/11/2012 1924    ASSESSMENT/PLAN: 49 year old female with history of DVT on Xarelto, family history of blood clots who is seen in consultation at the request of Dr. Ronnald Ramp for consideration of screening colonoscopy.  1. Average risk colorectal cancer screening in the setting of chronic anticoagulation -- average risk screening colonoscopy recommended. We discussed the risk and benefits and alternatives and she is agreeable  to proceed. Will hold Xarelto 2 days prior to endoscopic procedures - will instruct when and how to resume after procedure. Benefits and risks of procedure explained including risks of bleeding, perforation, infection, missed lesions, reactions to medications and possible need for hospitalization and surgery for complications. Additional rare but real risk of stroke or other vascular clotting events off Xarelto also explained and need to seek urgent help if any signs of these problems occur. Will communicate by phone or EMR with patient's  prescribing provider to confirm that holding Xarelto is reasonable in this case.      [id number] L Jones, Md [address] [facility] [address] [place], [place] [address]

## 2015-09-22 NOTE — Telephone Encounter (Signed)
Yes, she can hold Xarelto for 2 days, and then 2 days after the procedure.

## 2015-09-24 NOTE — Telephone Encounter (Signed)
I have advised patient that per Dr Ronnald Ramp, she may hold xarelto for 2 days before and 2 days after procedure. Patient verbalizes understanding.

## 2015-09-24 NOTE — Telephone Encounter (Signed)
Left message for patient to call back  

## 2015-09-27 ENCOUNTER — Other Ambulatory Visit: Payer: Self-pay | Admitting: Internal Medicine

## 2015-09-28 ENCOUNTER — Telehealth: Payer: Self-pay | Admitting: Internal Medicine

## 2015-09-28 NOTE — Telephone Encounter (Signed)
Pt called and said she is completely out of  Best number [phone]

## 2015-09-28 NOTE — Telephone Encounter (Signed)
Pt called in and said that she is completely out of her xaretto.  She said that she needs some called in today.

## 2015-09-28 NOTE — Telephone Encounter (Signed)
This was sent to [facility] yesterday

## 2015-09-28 NOTE — Telephone Encounter (Signed)
Informed pt as well as scheduled f/u

## 2015-10-11 ENCOUNTER — Telehealth: Payer: Self-pay | Admitting: *Deleted

## 2015-10-11 MED ORDER — CLONAZEPAM 0.5 MG PO TABS: 0.5000 mg | ORAL_TABLET | Freq: Two times a day (BID) | ORAL | Status: DC | PRN

## 2015-10-11 NOTE — Telephone Encounter (Signed)
Notified pt rx fax to [facility].../lmb 

## 2015-10-11 NOTE — Telephone Encounter (Signed)
Received call pt states she is needing refill on her Clonazepam. MD out of office pls advise if ok.Marland KitchenJohny Chess

## 2015-10-11 NOTE — Telephone Encounter (Signed)
[place] controlled substance registry reviewed: no early refills or other B[facility] rx's filled other than clonzaepam as rx'd by Dayna Ramus for refill as requested - printed and signed

## 2015-11-01 ENCOUNTER — Telehealth: Payer: Self-pay | Admitting: Internal Medicine

## 2015-11-01 NOTE — Telephone Encounter (Signed)
I have advised patient that as soon as we receive samples of Suprep next week, I will place a sample at the front desk and give her a call. She verbalizes understanding.

## 2015-11-06 ENCOUNTER — Telehealth: Payer: Self-pay

## 2015-11-06 LAB — HM COLONOSCOPY

## 2015-11-06 NOTE — Telephone Encounter (Signed)
Patient called in wanting to know if we have any suprep samples. She says procedure is on Thursday. I informed her that Carla Drape will call her when samples have been delivered to the office.

## 2015-11-07 NOTE — Telephone Encounter (Signed)
Patient came and picked up prep

## 2015-11-08 ENCOUNTER — Ambulatory Visit (AMBULATORY_SURGERY_CENTER): Payer: PRIVATE HEALTH INSURANCE | Admitting: Internal Medicine

## 2015-11-08 ENCOUNTER — Encounter: Payer: Self-pay | Admitting: Internal Medicine

## 2015-11-08 ENCOUNTER — Other Ambulatory Visit: Payer: Self-pay | Admitting: Internal Medicine

## 2015-11-08 VITALS — BP 110/71 | HR 72 | Temp 97.6°F | Resp 15 | Ht 71.0 in | Wt 252.0 lb

## 2015-11-08 DIAGNOSIS — Z1211 Encounter for screening for malignant neoplasm of colon: Secondary | ICD-10-CM

## 2015-11-08 DIAGNOSIS — D125 Benign neoplasm of sigmoid colon: Secondary | ICD-10-CM | POA: Diagnosis not present

## 2015-11-08 DIAGNOSIS — D127 Benign neoplasm of rectosigmoid junction: Secondary | ICD-10-CM | POA: Diagnosis not present

## 2015-11-08 MED ORDER — SODIUM CHLORIDE 0.9 % IV SOLN: 500.0000 mL | INTRAVENOUS | Status: DC

## 2015-11-08 NOTE — Progress Notes (Signed)
Report to PACU, RN, vss, BBS= Clear.  

## 2015-11-08 NOTE — Progress Notes (Signed)
Restart Xarelto 11-09-15 per Dr. Hilarie Fredrickson

## 2015-11-08 NOTE — Progress Notes (Signed)
Called to room to assist during endoscopic procedure.  Patient ID and intended procedure confirmed with present staff. Received instructions for my participation in the procedure from the performing physician.  

## 2015-11-08 NOTE — Patient Instructions (Signed)
YOU HAD AN ENDOSCOPIC PROCEDURE TODAY AT [facility]:   Refer to the procedure report that was given to you for any specific questions about what was found during the examination.  If the procedure report does not answer your questions, please call your gastroenterologist to clarify.  If you requested that your care partner not be given the details of your procedure findings, then the procedure report has been included in a sealed envelope for you to review at your convenience later.  YOU SHOULD EXPECT: Some feelings of bloating in the abdomen. Passage of more gas than usual.  Walking can help get rid of the air that was put into your GI tract during the procedure and reduce the bloating. If you had a lower endoscopy (such as a colonoscopy or flexible sigmoidoscopy) you may notice spotting of blood in your stool or on the toilet paper. If you underwent a bowel prep for your procedure, you may not have a normal bowel movement for a few days.  Please Note:  You might notice some irritation and congestion in your nose or some drainage.  This is from the oxygen used during your procedure.  There is no need for concern and it should clear up in a day or so.  SYMPTOMS TO REPORT IMMEDIATELY:   Following lower endoscopy (colonoscopy or flexible sigmoidoscopy):  Excessive amounts of blood in the stool  Significant tenderness or worsening of abdominal pains  Swelling of the abdomen that is new, acute  Fever of 100F or higher  For urgent or emergent issues, a gastroenterologist can be reached at any hour by calling [phone].   DIET: Your first meal following the procedure should be a small meal and then it is ok to progress to your normal diet. Heavy or fried foods are harder to digest and may make you feel nauseous or bloated.  Likewise, meals heavy in dairy and vegetables can increase bloating.  Drink plenty of fluids but you should avoid alcoholic beverages for 24  hours.  ACTIVITY:  You should plan to take it easy for the rest of today and you should NOT DRIVE or use heavy machinery until tomorrow (because of the sedation medicines used during the test).    FOLLOW UP: Our staff will call the number listed on your records the next business day following your procedure to check on you and address any questions or concerns that you may have regarding the information given to you following your procedure. If we do not reach you, we will leave a message.  However, if you are feeling well and you are not experiencing any problems, there is no need to return our call.  We will assume that you have returned to your regular daily activities without incident.  If any biopsies were taken you will be contacted by phone or by letter within the next 1-3 weeks.  Please call us at [phone] if you have not heard about the biopsies in 3 weeks.   SIGNATURES/CONFIDENTIALITY: You and/or your care partner have signed paperwork which will be entered into your electronic medical record.  These signatures attest to the fact that that the information above on your After Visit Summary has been reviewed and is understood.  Full responsibility of the confidentiality of this discharge information lies with you and/or your care-partner.  Please read over handouts about diverticulosis, high fiber diets, and polyps  Restart Xarelto tomorrow (11-09-15)  Continue other medications

## 2015-11-08 NOTE — Op Note (Signed)
[place]  [address] [place], [address]   COLONOSCOPY PROCEDURE REPORT  PATIENT: Janet Hester, Janet Hester  MR#: [id number] BIRTHDATE: 05/13/1966 , 82  yrs. old GENDER: female ENDOSCOPIST: Jerene Bears, MD REFERRED [id number] Evalina Field, M.D. PROCEDURE DATE:  11/08/2015 PROCEDURE:   Colonoscopy, screening and Colonoscopy with snare polypectomy First Screening Colonoscopy - Avg.  risk and is 50 yrs.  old or older Yes.  Prior Negative Screening - Now for repeat screening. N/A  History of Adenoma - Now for follow-up colonoscopy & has been > or = to 3 yrs.  N/A  Polyps removed today? Yes ASA CLASS:   Class II INDICATIONS:Screening for colonic neoplasia and Colorectal Neoplasm Risk Assessment for this procedure is average risk. MEDICATIONS: Monitored anesthesia care and Propofol 300 mg IV  DESCRIPTION OF PROCEDURE:   After the risks benefits and alternatives of the procedure were thoroughly explained, informed consent was obtained.  The digital rectal exam revealed no rectal mass.   The LB [id number]  endoscope was introduced through the anus and advanced to the cecum, which was identified by both the appendix and ileocecal valve. No adverse events experienced. The quality of the prep was good.  (Suprep was used)  The instrument was then slowly withdrawn as the colon was fully examined. Estimated blood loss is zero unless otherwise noted in this procedure report.   COLON FINDINGS: A sessile polyp was found in the sigmoid colon. There was severe diverticulosis noted in the left colon with associated muscular hypertrophy, along with mild right-sided diverticulosis.  Retroflexed views revealed no abnormalities. The time to cecum = 2.7 Withdrawal time = 9.6   The scope was withdrawn and the procedure completed. COMPLICATIONS: There were no complications.  ENDOSCOPIC IMPRESSION: 1.   Sessile polyp was found in the sigmoid colon 2.   There was mild diverticulosis in the  right colon and severe diverticulosis noted in the left colon  RECOMMENDATIONS: 1.  Await pathology results 2.  High fiber diet 3.  If the polyp removed today is proven to be an adenomatous (pre-cancerous) polyp, you will need a repeat colonoscopy in 5 years.  Otherwise you should continue to follow colorectal cancer screening guidelines for "routine risk" patients with colonoscopy in 10 years.  You will receive a letter within 1-2 weeks with the results of your biopsy as well as final recommendations.  Please call my office if you have not received a letter after 3 weeks.  eSigned:  Jerene Bears, MD 11/08/2015 2:37 PM   cc:  the patient, Dr. Ronnald Ramp

## 2015-11-09 ENCOUNTER — Telehealth: Payer: Self-pay

## 2015-11-09 NOTE — Telephone Encounter (Signed)
  Follow up Call-  Call back number 11/08/2015  Post procedure Call Back phone  # [phone]  Permission to leave phone message Yes     Patient was called for follow up after her procedure on 11/08/2015. No answer at the number given. A message was left on her answering machine.

## 2015-11-13 ENCOUNTER — Encounter: Payer: Self-pay | Admitting: Internal Medicine

## 2015-11-14 ENCOUNTER — Other Ambulatory Visit (INDEPENDENT_AMBULATORY_CARE_PROVIDER_SITE_OTHER): Payer: PRIVATE HEALTH INSURANCE

## 2015-11-14 ENCOUNTER — Encounter: Payer: Self-pay | Admitting: Internal Medicine

## 2015-11-14 ENCOUNTER — Ambulatory Visit (INDEPENDENT_AMBULATORY_CARE_PROVIDER_SITE_OTHER): Payer: PRIVATE HEALTH INSURANCE | Admitting: Internal Medicine

## 2015-11-14 VITALS — BP 110/80 | HR 78 | Temp 97.8°F | Resp 16 | Ht 71.0 in | Wt 254.0 lb

## 2015-11-14 DIAGNOSIS — E785 Hyperlipidemia, unspecified: Secondary | ICD-10-CM

## 2015-11-14 DIAGNOSIS — Z720 Tobacco use: Secondary | ICD-10-CM | POA: Insufficient documentation

## 2015-11-14 DIAGNOSIS — M7061 Trochanteric bursitis, right hip: Secondary | ICD-10-CM | POA: Diagnosis not present

## 2015-11-14 DIAGNOSIS — Z0000 Encounter for general adult medical examination without abnormal findings: Secondary | ICD-10-CM

## 2015-11-14 DIAGNOSIS — F410 Panic disorder [episodic paroxysmal anxiety] without agoraphobia: Secondary | ICD-10-CM

## 2015-11-14 DIAGNOSIS — E669 Obesity, unspecified: Secondary | ICD-10-CM

## 2015-11-14 DIAGNOSIS — I825Z3 Chronic embolism and thrombosis of unspecified deep veins of distal lower extremity, bilateral: Secondary | ICD-10-CM

## 2015-11-14 DIAGNOSIS — D6859 Other primary thrombophilia: Secondary | ICD-10-CM | POA: Diagnosis not present

## 2015-11-14 LAB — BASIC METABOLIC PANEL
BUN: 10 mg/dL (ref 6–23)
CO2: 24 mEq/L (ref 19–32)
Calcium: 9.6 mg/dL (ref 8.4–10.5)
Chloride: 106 mEq/L (ref 96–112)
Creatinine, Ser: 0.88 mg/dL (ref 0.40–1.20)
GFR: 87.31 mL/min (ref >60.00)
Glucose, Bld: 81 mg/dL (ref 70–99)
POTASSIUM: 4 meq/L (ref 3.5–5.1)
SODIUM: 139 meq/L (ref 135–145)

## 2015-11-14 LAB — LIPID PANEL
CHOL/HDL RATIO: 5
Cholesterol: 212 mg/dL — ABNORMAL HIGH (ref 0–200)
HDL: 39.9 mg/dL (ref >39.00)
LDL CALC: 147 mg/dL — AB (ref 0–99)
NonHDL: 171.98
TRIGLYCERIDES: 125 mg/dL (ref 0.0–149.0)
VLDL: 25 mg/dL (ref 0.0–40.0)

## 2015-11-14 MED ORDER — TRAMADOL HCL 50 MG PO TABS: 50.0000 mg | ORAL_TABLET | Freq: Three times a day (TID) | ORAL | Status: DC | PRN

## 2015-11-14 MED ORDER — LORCASERIN HCL ER 20 MG PO TB24: 1.0000 | ORAL_TABLET | Freq: Every day | ORAL | Status: DC

## 2015-11-14 MED ORDER — CLONAZEPAM 0.5 MG PO TABS: 0.5000 mg | ORAL_TABLET | Freq: Two times a day (BID) | ORAL | Status: DC | PRN

## 2015-11-14 MED ORDER — RIVAROXABAN 20 MG PO TABS: ORAL_TABLET | ORAL | Status: DC

## 2015-11-14 NOTE — Progress Notes (Signed)
Subjective:  Patient ID: Janet Hester, female    DOB: June 12, 1966  Age: 50 y.o. MRN: [id number]  CC: Hyperlipidemia and Osteoarthritis   HPI Janet Hester presents for follow-up. She complains of chronic hip pain and wants a prescription for tramadol. She also complains of obesity and weight gain and wants to try a medication to help her lose weight. She has worked hard on her lifestyle modifications with diet and exercise.  Outpatient Prescriptions Prior to Visit  Medication Sig Dispense Refill  . acetaminophen (TYLENOL) 500 MG tablet Take 1,000 mg by mouth every 6 (six) hours as needed for moderate pain (sore throat).     Marland Kitchen EPINEPHrine (EPIPEN) 0.3 mg/0.3 mL IJ SOAJ injection Inject 0.3 mLs (0.3 mg total) into the muscle once as needed. 1 Device 1  . gabapentin (NEURONTIN) 100 MG capsule Take 300 mg by mouth at bedtime. For hot flashes    . PARoxetine (PAXIL-CR) 25 MG 24 hr tablet Take 25 mg by mouth daily.    . clonazePAM (KLONOPIN) 0.5 MG tablet Take 1 tablet (0.5 mg total) by mouth 2 (two) times daily as needed. for anxiety 60 tablet 0  . XARELTO 20 MG TABS tablet TAKE ONE TABLET BY MOUTH ONCE DAILY WITH SUPPER 30 tablet 3   No facility-administered medications prior to visit.    ROS Review of Systems  Constitutional: Positive for unexpected weight change. Negative for fever, chills, diaphoresis, activity change, appetite change and fatigue.  HENT: Negative.   Eyes: Negative.   Respiratory: Negative.   Cardiovascular: Negative.  Negative for chest pain, palpitations and leg swelling.  Gastrointestinal: Negative.  Negative for nausea, vomiting, abdominal pain, diarrhea, constipation and blood in stool.  Endocrine: Negative.   Genitourinary: Negative.  Negative for difficulty urinating.  Musculoskeletal: Positive for arthralgias. Negative for myalgias, back pain and neck pain.  Skin: Negative.  Negative for color change and rash.  Allergic/Immunologic: Negative.   Neurological:  Negative.  Negative for dizziness, weakness and light-headedness.  Hematological: Negative.  Negative for adenopathy. Does not bruise/bleed easily.  Psychiatric/Behavioral: Negative for suicidal ideas, confusion, sleep disturbance, dysphoric mood and decreased concentration. The patient is nervous/anxious.     Objective:  BP 110/80 mmHg  Pulse 78  Temp(Src) 97.8 F (36.6 C) (Oral)  Resp 16  Ht 5\' 11"  (1.803 m)  Wt 254 lb (115.214 kg)  BMI 35.44 kg/m2  SpO2 98%  BP Readings from Last 3 Encounters:  11/14/15 110/80  11/08/15 110/71  09/21/15 138/88    Wt Readings from Last 3 Encounters:  11/14/15 254 lb (115.214 kg)  11/08/15 252 lb (114.306 kg)  09/21/15 252 lb 3.2 oz (114.397 kg)    Physical Exam  Constitutional: She is oriented to person, place, and time. No distress.  HENT:  Mouth/Throat: Oropharynx is clear and moist. No oropharyngeal exudate.  Eyes: Conjunctivae are normal. Right eye exhibits no discharge. Left eye exhibits no discharge. No scleral icterus.  Neck: Normal range of motion. Neck supple. No JVD present. No tracheal deviation present. No thyromegaly present.  Cardiovascular: Normal rate, regular rhythm, normal heart sounds and intact distal pulses.  Exam reveals no gallop and no friction rub.   No murmur heard. Pulmonary/Chest: Effort normal and breath sounds normal. No stridor. No respiratory distress. She has no wheezes. She has no rales. She exhibits no tenderness.  Abdominal: Soft. Bowel sounds are normal. She exhibits no distension and no mass. There is no tenderness. There is no rebound and no guarding.  Musculoskeletal: Normal  range of motion. She exhibits no edema or tenderness.       Right hip: Normal. She exhibits normal range of motion, no tenderness, no bony tenderness and no swelling.       Left hip: Normal. She exhibits normal range of motion, no tenderness, no bony tenderness and no swelling.  Lymphadenopathy:    She has no cervical adenopathy.   Neurological: She is oriented to person, place, and time.  Skin: Skin is warm and dry. No rash noted. She is not diaphoretic. No erythema. No pallor.  Psychiatric: Her speech is normal and behavior is normal. Judgment and thought content normal. Her mood appears anxious. Her affect is not angry and not blunt. Cognition and memory are normal. She does not exhibit a depressed mood.  Vitals reviewed.   Lab Results  Component Value Date   WBC 6.7 03/15/2015   HGB 14.0 03/15/2015   HCT 42.1 03/15/2015   PLT 270.0 03/15/2015   GLUCOSE 81 11/14/2015   CHOL 212* 11/14/2015   TRIG 125.0 11/14/2015   HDL 39.90 11/14/2015   LDLCALC 147* 11/14/2015   ALT 17 03/15/2015   AST 22 03/15/2015   NA 139 11/14/2015   K 4.0 11/14/2015   CL 106 11/14/2015   CREATININE 0.88 11/14/2015   BUN 10 11/14/2015   CO2 24 11/14/2015   TSH 1.08 03/15/2015   INR 1.34 01/05/2015    No results found.  Assessment & Plan:   Janet Hester was seen today for hyperlipidemia and osteoarthritis.  Diagnoses and all orders for this visit:  Routine general medical examination at a health care facility -     HIV antibody; Future  Trochanteric bursitis of right hip -     traMADol (ULTRAM) 50 MG tablet; Take 1 tablet (50 mg total) by mouth every 8 (eight) hours as needed.  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 for anxiety  Primary hypercoagulable state ([place])- her renal function is normal, she will remain on the 20 mg a day of Xarelto. -     rivaroxaban (XARELTO) 20 MG TABS tablet; TAKE ONE TABLET BY MOUTH ONCE DAILY WITH SUPPER -     Basic metabolic panel; Future  Obesity  (BMI 35.0-39.9 without comorbidity) (HCC) -     Lorcaserin HCl ER (BELVIQ XR) 20 MG TB24; Take 1 tablet by mouth daily.  Tobacco abuse   I have changed Janet Hester to rivaroxaban. I am also having her start on Lorcaserin HCl ER and traMADol. Additionally, I am having her maintain her gabapentin, acetaminophen, EPINEPHrine, PARoxetine, and clonazePAM.  Meds ordered this encounter  Medications  . rivaroxaban (XARELTO) 20 MG TABS tablet    Sig: TAKE ONE TABLET BY MOUTH ONCE DAILY WITH SUPPER    Dispense:  30 tablet    Refill:  3  . clonazePAM (KLONOPIN) 0.5 MG tablet    Sig: Take 1 tablet (0.5 mg total) by mouth 2 (two) times daily as needed. for anxiety    Dispense:  60 tablet    Refill:  5  . Lorcaserin HCl ER (BELVIQ XR) 20 MG  TB24    Sig: Take 1 tablet by mouth daily.    Dispense:  30 tablet    Refill:  5  . traMADol (ULTRAM) 50 MG tablet    Sig: Take 1 tablet (50 mg total) by mouth every 8 (eight) hours as needed.    Dispense:  60 tablet    Refill:  3     Follow-up: Return in about 6 months (around 05/13/2016).  Scarlette Calico, MD

## 2015-11-14 NOTE — Progress Notes (Signed)
Pre visit review using our clinic review tool, if applicable. No additional management support is needed unless otherwise documented below in the visit note. 

## 2015-11-14 NOTE — Patient Instructions (Signed)
Obesity Obesity is defined as having too much total body fat and a body mass index (BMI) of 30 or more. BMI is an estimate of body fat and is calculated from your height and weight. BMI is typically calculated by your health care provider during regular wellness visits. Obesity happens when you consume more calories than you can burn by exercising or performing daily physical tasks. Prolonged obesity can cause major illnesses or emergencies, such as:  Stroke.  Heart disease.  Diabetes.  Cancer.  Arthritis.  High blood pressure (hypertension).  High cholesterol.  Sleep apnea.  Erectile dysfunction.  Infertility problems. CAUSES   Regularly eating unhealthy foods.  Physical inactivity.  Certain disorders, such as an underactive thyroid (hypothyroidism), Cushing's syndrome, and polycystic ovarian syndrome.  Certain medicines, such as steroids, some depression medicines, and antipsychotics.  Genetics.  Lack of sleep. DIAGNOSIS A health care provider can diagnose obesity after calculating your BMI. Obesity will be diagnosed if your BMI is 30 or higher. There are other methods of measuring obesity levels. Some other methods include measuring your skinfold thickness, your waist circumference, and comparing your hip circumference to your waist circumference. TREATMENT  A healthy treatment program includes some or all of the following:  Long-term dietary changes.  Exercise and physical activity.  Behavioral and lifestyle changes.  Medicine only under the supervision of your health care provider. Medicines may help, but only if they are used with diet and exercise programs. If your BMI is 40 or higher, your health care provider may recommend specialized surgery or programs to help with weight loss. An unhealthy treatment program includes:  Fasting.  Fad diets.  Supplements and drugs. These choices do not succeed in long-term weight control. HOME CARE  INSTRUCTIONS  Exercise and perform physical activity as directed by your health care provider. To increase physical activity, try the following:  Use stairs instead of elevators.  Park farther away from store entrances.  Garden, bike, or walk instead of watching television or using the computer.  Eat healthy, low-calorie foods and drinks on a regular basis. Eat more fruits and vegetables. Use low-calorie cookbooks or take healthy cooking classes.  Limit fast food, sweets, and processed snack foods.  Eat smaller portions.  Keep a daily journal of everything you eat. There are many free websites to help you with this. It may be helpful to measure your foods so you can determine if you are eating the correct portion sizes.  Avoid drinking alcohol. Drink more water and drinks without calories.  Take vitamins and supplements only as recommended by your health care provider.  Weight-loss support groups, registered dietitians, counselors, and stress reduction education can also be very helpful. SEEK IMMEDIATE MEDICAL CARE IF:  You have chest pain or tightness.  You have trouble breathing or feel short of breath.  You have weakness or leg numbness.  You feel confused or have trouble talking.  You have sudden changes in your vision.   This information is not intended to replace advice given to you by your health care provider. Make sure you discuss any questions you have with your health care provider.   Document Released: 11/06/2004 Document Revised: 10/20/2014 Document Reviewed: 11/05/2011 Elsevier Interactive Patient Education 2016 [employer] Inc[employer]  

## 2015-11-15 ENCOUNTER — Telehealth: Payer: Self-pay | Admitting: Internal Medicine

## 2015-11-15 LAB — HIV ANTIBODY (ROUTINE TESTING W REFLEX): HIV 1&2 Ab, 4th Generation: NONREACTIVE

## 2015-11-15 NOTE — Telephone Encounter (Signed)
Pt called in and would like the result of her blood work.  Can you call her with those when you get a chance?    [phone]

## 2015-11-20 NOTE — Telephone Encounter (Signed)
Advisement on labs  

## 2015-11-20 NOTE — Telephone Encounter (Signed)
Lab Results  Component Value Date   WBC 6.7 03/15/2015   HGB 14.0 03/15/2015   HCT 42.1 03/15/2015   PLT 270.0 03/15/2015   GLUCOSE 81 11/14/2015   CHOL 212* 11/14/2015   TRIG 125.0 11/14/2015   HDL 39.90 11/14/2015   LDLCALC 147* 11/14/2015   ALT 17 03/15/2015   AST 22 03/15/2015   NA 139 11/14/2015   K 4.0 11/14/2015   CL 106 11/14/2015   CREATININE 0.88 11/14/2015   BUN 10 11/14/2015   CO2 24 11/14/2015   TSH 1.08 03/15/2015   INR 1.34 01/05/2015    Her labs were all normal

## 2015-11-20 NOTE — Telephone Encounter (Signed)
Pt informed

## 2016-03-15 ENCOUNTER — Encounter: Payer: Self-pay | Admitting: Family Medicine

## 2016-03-15 ENCOUNTER — Ambulatory Visit (INDEPENDENT_AMBULATORY_CARE_PROVIDER_SITE_OTHER): Payer: PRIVATE HEALTH INSURANCE | Admitting: Family Medicine

## 2016-03-15 VITALS — BP 120/82 | HR 76 | Temp 98.0°F | Resp 16 | Wt 255.0 lb

## 2016-03-15 DIAGNOSIS — T148 Other injury of unspecified body region: Secondary | ICD-10-CM

## 2016-03-15 DIAGNOSIS — T148XXA Other injury of unspecified body region, initial encounter: Secondary | ICD-10-CM | POA: Insufficient documentation

## 2016-03-15 MED ORDER — LIDOCAINE VISCOUS 2 % MT SOLN: OROMUCOSAL | Status: DC

## 2016-03-15 MED ORDER — LIDOCAINE 5 % EX OINT: 1.0000 "application " | TOPICAL_OINTMENT | Freq: Three times a day (TID) | CUTANEOUS | Status: DC | PRN

## 2016-03-15 NOTE — Patient Instructions (Signed)
Use lidocaine on the area that hurts, either ointment or viscous, whichever is cheaper.   Put a clean dressing over the area.  Take care.  Glad to see you.

## 2016-03-15 NOTE — Assessment & Plan Note (Signed)
Normal knee rom, not a knee injury itself.   Likely peripheral nerve irritation. Can try lidocaine topically, either ointment or viscous, whichever is cheaper.   Doesn't appear infected.

## 2016-03-15 NOTE — Progress Notes (Signed)
Pre visit review using our clinic review tool, if applicable. No additional management support is needed unless otherwise documented below in the visit note.  Fell 8 days ago.  She thought she was going to gradually get better.  She had used topical antibiotic cream, dresses the area, used dermaplast spray.  Scraped her knee and a lot of stinging around the abrasion.  The knee joint itself isn't tender.  Prev bruising is resolved.  "it feels like a bee sting" around the abrasion.    Meds, vitals, and allergies reviewed.   ROS: Per HPI unless specifically indicated in ROS section   nad R knee isn't tender or abraded.   She has superficial abrasion distal to the R knee on the upper anterior shin.  Really sensitive to light touch in around the scab. No spreading erythema.  Looks like it is healing normally, with scab intact.

## 2016-04-02 ENCOUNTER — Encounter: Payer: Self-pay | Admitting: Internal Medicine

## 2016-04-02 ENCOUNTER — Ambulatory Visit (INDEPENDENT_AMBULATORY_CARE_PROVIDER_SITE_OTHER): Payer: PRIVATE HEALTH INSURANCE | Admitting: Internal Medicine

## 2016-04-02 VITALS — BP 100/78 | HR 89 | Temp 97.8°F | Resp 16 | Ht 71.0 in | Wt 255.0 lb

## 2016-04-02 DIAGNOSIS — I825Z3 Chronic embolism and thrombosis of unspecified deep veins of distal lower extremity, bilateral: Secondary | ICD-10-CM | POA: Diagnosis not present

## 2016-04-02 DIAGNOSIS — M7061 Trochanteric bursitis, right hip: Secondary | ICD-10-CM

## 2016-04-02 DIAGNOSIS — D6859 Other primary thrombophilia: Secondary | ICD-10-CM

## 2016-04-02 DIAGNOSIS — F410 Panic disorder [episodic paroxysmal anxiety] without agoraphobia: Secondary | ICD-10-CM | POA: Diagnosis not present

## 2016-04-02 MED ORDER — RIVAROXABAN 20 MG PO TABS: ORAL_TABLET | ORAL | Status: DC

## 2016-04-02 MED ORDER — CLONAZEPAM 0.5 MG PO TABS: 0.5000 mg | ORAL_TABLET | Freq: Two times a day (BID) | ORAL | Status: DC | PRN

## 2016-04-02 NOTE — Progress Notes (Signed)
Subjective:  Patient ID: Janet Hester, female    DOB: 1966-09-15  Age: 50 y.o. MRN: [id number]  CC: Hip Pain   HPI Janet Hester presents for follow-up on chronic hip pain and refill on Klonopin. She complains of persistent episodes of anxiety and panic but is not having trouble sleeping. She denies signs and symptoms of depression. She has chronic, bilateral hip pain that has been attributed to bursitis. She tried tramadol but she tells me that is caused gastrointestinal distress. She is comfortable taking over-the-counter medicines like Tylenol for the hip pain.  Outpatient Prescriptions Prior to Visit  Medication Sig Dispense Refill  . acetaminophen (TYLENOL) 500 MG tablet Take 1,000 mg by mouth every 6 (six) hours as needed for moderate pain (sore throat).     Marland Kitchen EPINEPHrine (EPIPEN) 0.3 mg/0.3 mL IJ SOAJ injection Inject 0.3 mLs (0.3 mg total) into the muscle once as needed. 1 Device 1  . gabapentin (NEURONTIN) 100 MG capsule Take 300 mg by mouth at bedtime. For hot flashes    . PARoxetine (PAXIL-CR) 25 MG 24 hr tablet Take 25 mg by mouth daily.    . clonazePAM (KLONOPIN) 0.5 MG tablet Take 1 tablet (0.5 mg total) by mouth 2 (two) times daily as needed. for anxiety 60 tablet 5  . lidocaine (XYLOCAINE) 2 % solution Apply 5 ml 3 times a day as needed for pain 100 mL 0  . lidocaine (XYLOCAINE) 5 % ointment Apply 1 application topically 3 (three) times daily as needed. 35.44 g 0  . rivaroxaban (XARELTO) 20 MG TABS tablet TAKE ONE TABLET BY MOUTH ONCE DAILY WITH SUPPER 30 tablet 3  . traMADol (ULTRAM) 50 MG tablet Take 1 tablet (50 mg total) by mouth every 8 (eight) hours as needed. (Patient not taking: Reported on 04/02/2016) 60 tablet 3   No facility-administered medications prior to visit.    ROS Review of Systems  Constitutional: Negative.  Negative for chills, diaphoresis and fatigue.  HENT: Negative.   Eyes: Negative.   Respiratory: Negative.  Negative for cough, choking, chest  tightness, shortness of breath and stridor.   Cardiovascular: Negative.  Negative for chest pain, palpitations and leg swelling.  Gastrointestinal: Negative.  Negative for abdominal pain.  Endocrine: Negative.   Genitourinary: Negative.  Negative for difficulty urinating.  Musculoskeletal: Positive for arthralgias (both hips). Negative for myalgias, back pain and joint swelling.  Skin: Negative.  Negative for color change, pallor and rash.  Allergic/Immunologic: Negative.   Neurological: Negative.  Negative for dizziness.  Hematological: Negative.  Negative for adenopathy. Does not bruise/bleed easily.  Psychiatric/Behavioral: Negative for suicidal ideas, behavioral problems, sleep disturbance, dysphoric mood and decreased concentration. The patient is nervous/anxious.     Objective:  BP 100/78 mmHg  Pulse 89  Temp(Src) 97.8 F (36.6 C) (Oral)  Resp 16  Ht 5\' 11"  (1.803 m)  Wt 255 lb (115.667 kg)  BMI 35.58 kg/m2  SpO2 97%  BP Readings from Last 3 Encounters:  04/02/16 100/78  03/15/16 120/82  11/14/15 110/80    Wt Readings from Last 3 Encounters:  04/02/16 255 lb (115.667 kg)  03/15/16 255 lb (115.667 kg)  11/14/15 254 lb (115.214 kg)    Physical Exam  Constitutional: She is oriented to person, place, and time. No distress.  HENT:  Mouth/Throat: Oropharynx is clear and moist. No oropharyngeal exudate.  Eyes: Conjunctivae are normal. Right eye exhibits no discharge. Left eye exhibits no discharge. No scleral icterus.  Neck: Normal range of motion. Neck supple.  No JVD present. No tracheal deviation present. No thyromegaly present.  Cardiovascular: Normal rate, regular rhythm, normal heart sounds and intact distal pulses.  Exam reveals no gallop and no friction rub.   No murmur heard. Pulmonary/Chest: Effort normal. No stridor. No respiratory distress. She has no wheezes. She has no rales. She exhibits no tenderness.  Abdominal: Soft. Bowel sounds are normal. She exhibits  no distension and no mass. There is no tenderness. There is no rebound and no guarding.  Musculoskeletal: Normal range of motion. She exhibits no edema or tenderness.  Lymphadenopathy:    She has no cervical adenopathy.  Neurological: She is oriented to person, place, and time.  Skin: Skin is warm and dry. No rash noted. She is not diaphoretic. No erythema. No pallor.  Vitals reviewed.   Lab Results  Component Value Date   WBC 6.7 03/15/2015   HGB 14.0 03/15/2015   HCT 42.1 03/15/2015   PLT 270.0 03/15/2015   GLUCOSE 81 11/14/2015   CHOL 212* 11/14/2015   TRIG 125.0 11/14/2015   HDL 39.90 11/14/2015   LDLCALC 147* 11/14/2015   ALT 17 03/15/2015   AST 22 03/15/2015   NA 139 11/14/2015   K 4.0 11/14/2015   CL 106 11/14/2015   CREATININE 0.88 11/14/2015   BUN 10 11/14/2015   CO2 24 11/14/2015   TSH 1.08 03/15/2015   INR 1.34 01/05/2015    No results found.  Assessment & Plan:   Janet Hester was seen today for hip pain.  Diagnoses and all orders for this visit:  Chronic deep vein thrombosis (DVT) of distal vein of both lower extremities ([place])- there is no evidence of recurrent DVT at this time -     rivaroxaban (XARELTO) 20 MG TABS tablet; TAKE ONE TABLET BY MOUTH ONCE DAILY WITH SUPPER  Primary hypercoagulable state ([place])- she has had no recurrent clots on Xarelto -     rivaroxaban (XARELTO) 20 MG TABS tablet; TAKE ONE TABLET BY MOUTH ONCE DAILY WITH SUPPER  Panic anxiety syndrome -     clonazePAM (KLONOPIN) 0.5 MG tablet; Take 1 tablet (0.5 mg total) by mouth 2 (two) times daily as needed. for anxiety  Trochanteric bursitis of right hip- at her request she will stop taking tramadol and will try Tylenol as needed for pain.   I have discontinued [name] traMADol, lidocaine, and lidocaine. I am also having her maintain her gabapentin, acetaminophen, EPINEPHrine, PARoxetine, rivaroxaban, and clonazePAM.  Meds ordered this encounter  Medications  . rivaroxaban (XARELTO)  20 MG TABS tablet    Sig: TAKE ONE TABLET BY MOUTH ONCE DAILY WITH SUPPER    Dispense:  30 tablet    Refill:  3  . clonazePAM (KLONOPIN) 0.5 MG tablet    Sig: Take 1 tablet (0.5 mg total) by mouth 2 (two) times daily as needed. for anxiety    Dispense:  60 tablet    Refill:  5     Follow-up: Return in about 4 months (around 08/02/2016).  Scarlette Calico, MD

## 2016-04-02 NOTE — Patient Instructions (Signed)
Hip Bursitis Bursitis is a swelling and soreness (inflammation) of a fluid-filled sac (bursa). This sac overlies and protects the joints.  CAUSES   Injury.  Overuse of the muscles surrounding the joint.  Arthritis.  Gout.  Infection.  Cold weather.  Inadequate warm-up and conditioning prior to activities. The cause may not be known.  SYMPTOMS   Mild to severe irritation.  Tenderness and swelling over the outside of the hip.  Pain with motion of the hip.  If the bursa becomes infected, a fever may be present. Redness, tenderness, and warmth will develop over the hip. Symptoms usually lessen in 3 to 4 weeks with treatment, but can come back. TREATMENT If conservative treatment does not work, your caregiver may advise draining the bursa and injecting cortisone into the area. This may speed up the healing process. This may also be used as an initial treatment of choice. HOME CARE INSTRUCTIONS   Apply ice to the affected area for 15-20 minutes every 3 to 4 hours while awake for the first 2 days. Put the ice in a plastic bag and place a towel between the bag of ice and your skin.  Rest the painful joint as much as possible, but continue to put the joint through a normal range of motion at least 4 times per day. When the pain lessens, begin normal, slow movements and usual activities to help prevent stiffness of the hip.  Only take over-the-counter or prescription medicines for pain, discomfort, or fever as directed by your caregiver.  Use crutches to limit weight bearing on the hip joint, if advised.  Elevate your painful hip to reduce swelling. Use pillows for propping and cushioning your legs and hips.  Gentle massage may provide comfort and decrease swelling. SEEK IMMEDIATE MEDICAL CARE IF:   Your pain increases even during treatment, or you are not improving.  You have a fever.  You have heat and inflammation over the involved bursa.  You have any other questions or  concerns. MAKE SURE YOU:   Understand these instructions.  Will watch your condition.  Will get help right away if you are not doing well or get worse.   This information is not intended to replace advice given to you by your health care provider. Make sure you discuss any questions you have with your health care provider.   Document Released: 03/21/2002 Document Revised: 12/22/2011 Document Reviewed: 05/01/2015 Elsevier Interactive Patient Education 2016 [employer] Inc.  

## 2016-04-02 NOTE — Progress Notes (Signed)
Pre visit review using our clinic review tool, if applicable. No additional management support is needed unless otherwise documented below in the visit note. 

## 2016-04-03 ENCOUNTER — Telehealth: Payer: Self-pay | Admitting: Internal Medicine

## 2016-04-03 NOTE — Telephone Encounter (Signed)
Pt called in and said that Dr Ronnald Ramp was going to call in 800 mg ibuprofen for her on last visit but it was not at the pharmacy

## 2016-04-03 NOTE — Telephone Encounter (Signed)
I don't think ibuprofen is safe with her blood thinner Try tylenol

## 2016-04-03 NOTE — Telephone Encounter (Signed)
Notified pt w/MD response. Pt states that the tylenol really doesn't help with the Bursitis. She had tried taking the tramadol but that upsets her stomach. Pt is wanting to know is there another alternative she can take to help w/ bursitis flare...Johny Chess

## 2016-04-25 DIAGNOSIS — Z86718 Personal history of other venous thrombosis and embolism: Secondary | ICD-10-CM | POA: Insufficient documentation

## 2016-04-25 DIAGNOSIS — F329 Major depressive disorder, single episode, unspecified: Secondary | ICD-10-CM | POA: Insufficient documentation

## 2016-04-25 DIAGNOSIS — F32A Depression, unspecified: Secondary | ICD-10-CM | POA: Insufficient documentation

## 2016-04-25 DIAGNOSIS — N959 Unspecified menopausal and perimenopausal disorder: Secondary | ICD-10-CM | POA: Insufficient documentation

## 2016-04-29 DIAGNOSIS — L900 Lichen sclerosus et atrophicus: Secondary | ICD-10-CM | POA: Insufficient documentation

## 2016-05-29 ENCOUNTER — Telehealth: Payer: Self-pay | Admitting: Internal Medicine

## 2016-05-29 NOTE — Telephone Encounter (Signed)
Pt stating that dentis is faxing over request approval for pt to go off of Xarelto for tooth pull. Please look out and help her get this done ASAP.   Thank you

## 2016-06-02 ENCOUNTER — Encounter: Payer: Self-pay | Admitting: Internal Medicine

## 2016-06-03 NOTE — Telephone Encounter (Signed)
Pt called to check up on this. Please call her back if we have it or once we fax back to the dentis.

## 2016-06-03 NOTE — Telephone Encounter (Signed)
I do have this ready to fax. Will fax before I go to lunch

## 2016-06-03 NOTE — Telephone Encounter (Signed)
refaxed per pt request.

## 2016-06-30 NOTE — Telephone Encounter (Signed)
Prednisone would be the next anti-inflammatory, however based on it has been 3 months since she has been seen an office visit is recommended.

## 2016-06-30 NOTE — Telephone Encounter (Signed)
Pt contacted and stated that she has been waiting for a response for several months and never heard anything.  She said that she would make an appt with PCP and she would not be leaving until she had her answer.

## 2016-06-30 NOTE — Telephone Encounter (Signed)
Pt is interested to know what she can take to relieve inflammation that does not interact with the xarelto. Can you advise in PCP absence?

## 2016-06-30 NOTE — Telephone Encounter (Signed)
Noted  

## 2016-06-30 NOTE — Telephone Encounter (Signed)
Pt called want to know why we never follow up about this. Pt request to speak to the assistant and still waiting for the answer. Please give her a call today if possible.

## 2016-07-11 ENCOUNTER — Other Ambulatory Visit: Payer: Self-pay | Admitting: *Deleted

## 2016-07-11 DIAGNOSIS — D6859 Other primary thrombophilia: Secondary | ICD-10-CM

## 2016-07-11 DIAGNOSIS — I825Z3 Chronic embolism and thrombosis of unspecified deep veins of distal lower extremity, bilateral: Secondary | ICD-10-CM

## 2016-07-11 MED ORDER — RIVAROXABAN 20 MG PO TABS: ORAL_TABLET | ORAL | 0 refills | Status: DC

## 2016-07-11 NOTE — Telephone Encounter (Signed)
Pt left msg on triage she is needing refill on her Xarelto sent to [facility]. Sent in 30 day pt is overdue for f/u appt...Johny Chess

## 2016-07-15 ENCOUNTER — Ambulatory Visit: Payer: PRIVATE HEALTH INSURANCE | Admitting: Internal Medicine

## 2016-11-14 ENCOUNTER — Other Ambulatory Visit: Payer: Self-pay | Admitting: Nurse Practitioner

## 2016-11-14 DIAGNOSIS — N6320 Unspecified lump in the left breast, unspecified quadrant: Secondary | ICD-10-CM

## 2016-11-19 ENCOUNTER — Ambulatory Visit
Admission: RE | Admit: 2016-11-19 | Discharge: 2016-11-19 | Disposition: A | Discharge status: Home / Self Care | Payer: PRIVATE HEALTH INSURANCE | Source: Ambulatory Visit | Attending: Nurse Practitioner | Admitting: Nurse Practitioner

## 2016-11-19 DIAGNOSIS — N6320 Unspecified lump in the left breast, unspecified quadrant: Secondary | ICD-10-CM

## 2017-04-16 ENCOUNTER — Inpatient Hospital Stay (HOSPITAL_COMMUNITY)
Admission: AD | Admit: 2017-04-16 | Discharge: 2017-04-20 | DRG: 885 | Disposition: A | Discharge status: Home / Self Care | Payer: PRIVATE HEALTH INSURANCE | Attending: Psychiatry | Admitting: Psychiatry

## 2017-04-16 ENCOUNTER — Encounter (HOSPITAL_COMMUNITY): Payer: Self-pay | Admitting: *Deleted

## 2017-04-16 DIAGNOSIS — Z818 Family history of other mental and behavioral disorders: Secondary | ICD-10-CM | POA: Diagnosis not present

## 2017-04-16 DIAGNOSIS — Z6835 Body mass index (BMI) 35.0-35.9, adult: Secondary | ICD-10-CM | POA: Diagnosis not present

## 2017-04-16 DIAGNOSIS — Z8049 Family history of malignant neoplasm of other genital organs: Secondary | ICD-10-CM

## 2017-04-16 DIAGNOSIS — Z915 Personal history of self-harm: Secondary | ICD-10-CM | POA: Diagnosis not present

## 2017-04-16 DIAGNOSIS — R45851 Suicidal ideations: Secondary | ICD-10-CM | POA: Diagnosis present

## 2017-04-16 DIAGNOSIS — Z7951 Long term (current) use of inhaled steroids: Secondary | ICD-10-CM | POA: Diagnosis not present

## 2017-04-16 DIAGNOSIS — E669 Obesity, unspecified: Secondary | ICD-10-CM | POA: Diagnosis present

## 2017-04-16 DIAGNOSIS — E785 Hyperlipidemia, unspecified: Secondary | ICD-10-CM | POA: Diagnosis present

## 2017-04-16 DIAGNOSIS — F411 Generalized anxiety disorder: Secondary | ICD-10-CM | POA: Diagnosis present

## 2017-04-16 DIAGNOSIS — F419 Anxiety disorder, unspecified: Secondary | ICD-10-CM | POA: Diagnosis not present

## 2017-04-16 DIAGNOSIS — F410 Panic disorder [episodic paroxysmal anxiety] without agoraphobia: Secondary | ICD-10-CM | POA: Diagnosis present

## 2017-04-16 DIAGNOSIS — Z86718 Personal history of other venous thrombosis and embolism: Secondary | ICD-10-CM | POA: Diagnosis not present

## 2017-04-16 DIAGNOSIS — Z91013 Allergy to seafood: Secondary | ICD-10-CM

## 2017-04-16 DIAGNOSIS — Z7901 Long term (current) use of anticoagulants: Secondary | ICD-10-CM

## 2017-04-16 DIAGNOSIS — F17210 Nicotine dependence, cigarettes, uncomplicated: Secondary | ICD-10-CM | POA: Diagnosis not present

## 2017-04-16 DIAGNOSIS — F332 Major depressive disorder, recurrent severe without psychotic features: Secondary | ICD-10-CM | POA: Diagnosis present

## 2017-04-16 MED ORDER — NICOTINE POLACRILEX 2 MG MT GUM
2.0000 mg | CHEWING_GUM | OROMUCOSAL | Status: DC | PRN
  Administered 2017-04-17 – 2017-04-19 (×5): 2 mg via ORAL
  Filled 2017-04-16 (×2): qty 1

## 2017-04-16 MED ORDER — PAROXETINE HCL 20 MG PO TABS
40.0000 mg | ORAL_TABLET | Freq: Every day | ORAL | Status: DC
  Administered 2017-04-17: 40 mg via ORAL
  Filled 2017-04-16 (×3): qty 2

## 2017-04-16 MED ORDER — HYDROXYZINE HCL 25 MG PO TABS
25.0000 mg | ORAL_TABLET | Freq: Four times a day (QID) | ORAL | Status: DC | PRN
  Filled 2017-04-16: qty 10

## 2017-04-16 MED ORDER — RIVAROXABAN 20 MG PO TABS
20.0000 mg | ORAL_TABLET | Freq: Every day | ORAL | Status: DC
  Administered 2017-04-16 – 2017-04-19 (×4): 20 mg via ORAL
  Filled 2017-04-16 (×2): qty 1
  Filled 2017-04-16: qty 7
  Filled 2017-04-16 (×4): qty 1

## 2017-04-16 MED ORDER — MAGNESIUM HYDROXIDE 400 MG/5ML PO SUSP: 30.0000 mL | Freq: Every day | ORAL | Status: DC | PRN

## 2017-04-16 MED ORDER — ACETAMINOPHEN 325 MG PO TABS: 650.0000 mg | ORAL_TABLET | Freq: Four times a day (QID) | ORAL | Status: DC | PRN

## 2017-04-16 MED ORDER — ALUM & MAG HYDROXIDE-SIMETH 200-200-20 MG/5ML PO SUSP: 30.0000 mL | ORAL | Status: DC | PRN

## 2017-04-16 MED ORDER — TRAZODONE HCL 50 MG PO TABS
50.0000 mg | ORAL_TABLET | Freq: Every evening | ORAL | Status: DC | PRN
  Administered 2017-04-16 – 2017-04-19 (×4): 50 mg via ORAL
  Filled 2017-04-16 (×2): qty 1
  Filled 2017-04-16: qty 7
  Filled 2017-04-16 (×2): qty 1

## 2017-04-16 MED ORDER — CLONIDINE HCL 0.1 MG PO TABS
0.1000 mg | ORAL_TABLET | Freq: Once | ORAL | Status: AC
  Administered 2017-04-16: 0.1 mg via ORAL
  Filled 2017-04-16 (×2): qty 1

## 2017-04-16 MED ORDER — BUPROPION HCL ER (XL) 300 MG PO TB24
300.0000 mg | ORAL_TABLET | Freq: Every day | ORAL | Status: DC
  Administered 2017-04-17: 300 mg via ORAL
  Filled 2017-04-16 (×3): qty 1

## 2017-04-16 NOTE — Progress Notes (Signed)
Patient attended group and said her day was a 1. Today is her first day.
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
## 2017-04-16 NOTE — Progress Notes (Signed)
Patient ID: Janet Hester, female   DOB: 1965-12-02, 51 y.o.   MRN: [id number]  Pt currently presents with a flat affect and anxious behavior. Pt reports to writer that their goal is to "get better." Pt states "my Paxil and Wellbutrin just weren't making the thoughts go away." Pt reports poor sleep prior to arrival.  Adheres with current medication regimen. Reports itching episode tonight, states "I was lying in bed and felt itchy, then it kept getting worse." Sheets changed, nothing out of the ordinary noted on bed.   Pt provided with medications per providers orders. Pt's labs and vitals were monitored throughout the night. Pt given a 1:1 about emotional and mental status. Pt supported and encouraged to express concerns and questions. Pt educated on medications. Provider notified of patients itching, see MAR. Urine specimen collected, to be picked up by lab in the AM.   Pt's safety ensured with 15 minute and environmental checks. Pt currently denies any current SI, HI and A/V hallucinations. Pt verbally agrees to seek staff if SI worsens, HI or A/VH occurs and to consult with staff before acting on any harmful thoughts. Will continue POC.

## 2017-04-16 NOTE — Progress Notes (Signed)
Admission Note:  51 year old female who presents as a walk-in, in no acute distress, for the treatment of SI and Depression.  Patient reports that she has been depressed for "a couple of months". Patient verbalizes feeling "sad, hopeless, racing thoughts, confused, forgetting stuff, crying spells, and daydreaming" which is affecting her ability to work.  Additionally, patient has been experiencing anhedonia.  Patient reports that she was referred to [facility] by her primary care provider. Patient reports SI with a plan to "overdose on Tylenol PM". Patient reports four previous overdoses.  Patient currently contracts for safety. Patient denies AVH.  Patient appears flat and depressed throughout admission process.  Patient identifies her mother and sister passing away in 2013 as a possible stressor.  Patient currently lives alone and identifies her youngest daughter Altha Harm [phone]) as her support system.  Patient reports that her grandchildren are the only things that make her happy.  Patient denies drug or alcohol use.  While at [facility], patient would like to "Get better. Get healthier", "Get up each morning and embrace the day", "Be strong enough to handle day to day activities", "Not be shut in a whole weekend and not go out".  Skin was assessed and found to be clear of any abnormal marks. Patient searched and no contraband found, POC and unit policies explained and understanding verbalized. Consents obtained. Patient had no additional questions or concerns.
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
## 2017-04-16 NOTE — Tx Team (Signed)
Initial Treatment Plan 04/16/2017 7:33 PM Janet Hester [id number]    PATIENT STRESSORS: Occupational concerns   PATIENT STRENGTHS: Ability for insight Average or above average intelligence Capable of independent living Communication skills Motivation for treatment/growth Supportive family/friends   PATIENT IDENTIFIED PROBLEMS: At risk for suicide  Depression  "Get better. Get healthier"  "Get up each morning and embrace the day"  "Be strong enough to handle day to day activities"  "Not be shut in a whole weekend and not go out"           DISCHARGE CRITERIA:  Ability to meet basic life and health needs Improved stabilization in mood, thinking, and/or behavior Motivation to continue treatment in a less acute level of care Need for constant or close observation no longer present  PRELIMINARY DISCHARGE PLAN: Outpatient therapy Return to previous living arrangement Return to previous work or school arrangements  PATIENT/FAMILY INVOLVEMENT: This treatment plan has been presented to and reviewed with the patient, Janet Hester.  The patient and family have been given the opportunity to ask questions and make suggestions.  Dustin Flock, RN 04/16/2017, 7:33 PM

## 2017-04-17 DIAGNOSIS — F419 Anxiety disorder, unspecified: Secondary | ICD-10-CM

## 2017-04-17 DIAGNOSIS — F332 Major depressive disorder, recurrent severe without psychotic features: Principal | ICD-10-CM

## 2017-04-17 DIAGNOSIS — F17210 Nicotine dependence, cigarettes, uncomplicated: Secondary | ICD-10-CM

## 2017-04-17 DIAGNOSIS — R45851 Suicidal ideations: Secondary | ICD-10-CM

## 2017-04-17 LAB — URINALYSIS, ROUTINE W REFLEX MICROSCOPIC
Bilirubin Urine: NEGATIVE
Glucose, UA: NEGATIVE mg/dL
Hgb urine dipstick: NEGATIVE
Ketones, ur: NEGATIVE mg/dL
LEUKOCYTES UA: NEGATIVE
NITRITE: NEGATIVE
PROTEIN: NEGATIVE mg/dL
SPECIFIC GRAVITY, URINE: 1.015 (ref 1.005–1.030)
pH: 5 (ref 5.0–8.0)

## 2017-04-17 LAB — RAPID URINE DRUG SCREEN, HOSP PERFORMED
AMPHETAMINES: NOT DETECTED
Barbiturates: NOT DETECTED
Benzodiazepines: NOT DETECTED
COCAINE: NOT DETECTED
OPIATES: NOT DETECTED
TETRAHYDROCANNABINOL: NOT DETECTED

## 2017-04-17 MED ORDER — GABAPENTIN 300 MG PO CAPS
300.0000 mg | ORAL_CAPSULE | Freq: Three times a day (TID) | ORAL | Status: DC
  Administered 2017-04-17 – 2017-04-20 (×10): 300 mg via ORAL
  Filled 2017-04-17 (×4): qty 1
  Filled 2017-04-17: qty 21
  Filled 2017-04-17 (×4): qty 1
  Filled 2017-04-17: qty 21
  Filled 2017-04-17 (×2): qty 1
  Filled 2017-04-17: qty 21
  Filled 2017-04-17 (×3): qty 1

## 2017-04-17 MED ORDER — PAROXETINE HCL 20 MG PO TABS
20.0000 mg | ORAL_TABLET | Freq: Every day | ORAL | Status: DC
  Administered 2017-04-18 – 2017-04-20 (×3): 20 mg via ORAL
  Filled 2017-04-17 (×4): qty 1

## 2017-04-17 MED ORDER — DULOXETINE HCL 20 MG PO CPEP
40.0000 mg | ORAL_CAPSULE | Freq: Every day | ORAL | Status: DC
  Administered 2017-04-17 – 2017-04-20 (×4): 40 mg via ORAL
  Filled 2017-04-17 (×3): qty 2
  Filled 2017-04-17: qty 14
  Filled 2017-04-17 (×3): qty 2

## 2017-04-17 MED ORDER — ARIPIPRAZOLE 5 MG PO TABS
5.0000 mg | ORAL_TABLET | Freq: Two times a day (BID) | ORAL | Status: DC
  Administered 2017-04-17 – 2017-04-20 (×7): 5 mg via ORAL
  Filled 2017-04-17 (×4): qty 1
  Filled 2017-04-17: qty 14
  Filled 2017-04-17 (×6): qty 1
  Filled 2017-04-17: qty 14

## 2017-04-17 MED ORDER — CLONAZEPAM 1 MG PO TABS
1.0000 mg | ORAL_TABLET | Freq: Two times a day (BID) | ORAL | Status: DC
  Administered 2017-04-17 – 2017-04-19 (×5): 1 mg via ORAL
  Filled 2017-04-17 (×7): qty 1
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
## 2017-04-17 NOTE — Progress Notes (Signed)
Recreation Therapy Notes  Date: 04/17/17 Time: 0930 Location: [address] Dayroom  Group Topic: Stress Management  Goal Area(s) Addresses:  Patient will verbalize importance of using healthy stress management.  Patient will identify positive emotions associated with healthy stress management.   Intervention: Stress Management  Activity :  Meditation.  LRT introduced the stress management technique of meditation.  LRT played a meditation from the calm app to allow patients to become centered and clear their minds and thoughts.  Patients were to follow along as the meditation played to fully engage in the activity.  Education:  Stress Management, Discharge Planning.   Education Outcome: Acknowledges edcuation/In group clarification offered/Needs additional education  Clinical Observations/Feedback: Pt did not attend group.   Victorino Sparrow, LRT/CTRS         Victorino Sparrow A 04/17/2017 12:24 PM

## 2017-04-17 NOTE — Progress Notes (Signed)
Data. Patient denies HI/AVH. Patient continues to endorse passive SI with no concrete plan. "It just comes up sometimes."  Patient spent most of the shift in bed, napping. Affect is flat and her conversation is soft and superficial. She did not complete her self inventory. Action. Emotional support and encouragement offered. Education provided on medication, indications and side effect. Q 15 minute checks done for safety. Response. Safety on the unit maintained through 15 minute checks.  Medications taken as prescribed. Remained calm and appropriate through out shift.
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
## 2017-04-17 NOTE — Progress Notes (Signed)
Patient ID: Janet Hester, female   DOB: April 08, 1966, 51 y.o.   MRN: [id number]  Pt currently presents with a flat affect and cooperative behavior. Pt reports to writer that their goal is to "continue resting and taking time for myself." Pt reports a good visit today from her daughters. Pt reports good sleep with current medication regimen.   Pt provided with medications per providers orders. Pt's labs and vitals were monitored throughout the night. Pt given a 1:1 about emotional and mental status. Pt supported and encouraged to express concerns and questions. Pt educated on medications.  Pt's safety ensured with 15 minute and environmental checks. Pt currently denies SI/HI and A/V hallucinations. Pt verbally agrees to seek staff if SI/HI or A/VH occurs and to consult with staff before acting on any harmful thoughts. Will continue POC.

## 2017-04-17 NOTE — Plan of Care (Signed)
Problem: Activity: Goal: Imbalance in normal sleep/wake cycle will improve Outcome: Not Progressing Patient hjas spent the entire shift, except for meals, in bed napping.

## 2017-04-17 NOTE — BHH Group Notes (Signed)
[place] LCSW Group Therapy 04/17/2017 1:15 PM  Type of Therapy: Group Therapy- Emotion Regulation  Participation Level: Pt invited. Did not attend.  Matthew Saras, MSW, LCSWA 04/17/2017 2:57 PM

## 2017-04-17 NOTE — Progress Notes (Signed)
Adult Psychoeducational Group Note  Date:  04/17/2017 Time:  8:54 PM  Group Topic/Focus:  Wrap-Up Group:   The focus of this group is to help patients review their daily goal of treatment and discuss progress on daily workbooks.  Participation Level:  Active  Participation Quality:  Appropriate  Affect:  Appropriate  Cognitive:  Alert  Insight: Appropriate  Engagement in Group:  Engaged  Modes of Intervention:  Discussion  Additional Comments:  Patient stated having a pretty good day. Patient's goal for today was to continue to get better.   Janet Hester L Janet Hester 04/17/2017, 8:54 PM

## 2017-04-18 NOTE — BHH Group Notes (Signed)
[place] Group Notes: (Clinical Social Work)   04/18/2017      Type of Therapy:  Group Therapy   Participation Level:  Did Not Attend despite MHT prompting   Selmer Dominion, LCSW 04/18/2017, 12:17 PM

## 2017-04-18 NOTE — Progress Notes (Signed)
Patient attended group and said that her day was a  8.  Her coping skills socializing, singing, and reading.
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
## 2017-04-18 NOTE — Progress Notes (Signed)
Writer spoke with patient and she reports having had a good day. Her sister and friend came to visit tonight. She reports that she has a good family support and will be staying with her daughter upon discharge. She is interested in attending outpatient therapy once discharged but needs to find a therapist in her network so that she doesn't have to pay $ 200-300 a visit which she can't afford. Writer encouraged her to speak to her social worker about resources in her area. She took scheduled medications and returned to work in her workbook from group. Support given and safety maintained on unit with 15 min checks.

## 2017-04-18 NOTE — Progress Notes (Signed)
Nursing Shift Note :  Nursing Progress Note: 7-7p  D- Mood is depressed and anxious, Pt is able to contract for safety." I'm here because my work causes me stress especially my boss.". Goal for today is coping skills for stress.  A - Observed pt minmally interacting in group and in the milieu.Support and encouragement offered, safety maintained with q 15 minutes. Pt identified her daughter as her support system.  R-Contracts for safety and continues to follow treatment plan, working on learning new coping skills for stress and anxiety.
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
## 2017-04-19 NOTE — Progress Notes (Signed)
D Pt appears more  depressed today. SHe has spent the majority of the day in the dayroom. She remaisn timid, shy and says " I want to go home". Her affect is blank. She makes good eye cotnact. A She completes her daily assessment and on it she wrote she denied SI today and she rated her depression, hopelessness and anxeity " 2/0/2", respectviely. R Pt encouraged t speak with MD about dc plans tomorrow.

## 2017-04-19 NOTE — Progress Notes (Signed)
Patient attended group and said that her day was a 10.  She said she enjoyed having lots of phone calls today.

## 2017-04-19 NOTE — BHH Suicide Risk Assessment (Signed)
[place] INPATIENT:  Family/Significant Other Suicide Prevention Education  Suicide Prevention Education:  Education Completed; daughter Janet Hester [phone] has been identified by the patient as the family member/significant other with whom the patient will be residing, and identified as the person(s) who will aid the patient in the event of a mental health crisis (suicidal ideations/suicide attempt).  With written consent from the patient, the family member/significant other has been provided the following suicide prevention education, prior to the and/or following the discharge of the patient.  DAUGHTER STATES SHE IS TAKING A LEAVE OF ABSENCE TO STAY WITH PT FOR AWHILE.  THERE ARE NO FIREARMS IN THE HOME.  The suicide prevention education provided includes the following:  Suicide risk factors  Suicide prevention and interventions  National Suicide Hotline telephone number  [facility] assessment telephone number  [facility] Emergency Assistance [place] and/or Residential Mobile Crisis Unit telephone number  Request made of family/significant other to:  Remove weapons (e.g., guns, rifles, knives), all items previously/currently identified as safety concern.    Remove drugs/medications (over-the-counter, prescriptions, illicit drugs), all items previously/currently identified as a safety concern.  The family member/significant other verbalizes understanding of the suicide prevention education information provided.  The family member/significant other agrees to remove the items of safety concern listed above.  Janet Hester 04/19/2017, 2:29 PM
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
## 2017-04-20 ENCOUNTER — Encounter (HOSPITAL_COMMUNITY): Payer: Self-pay | Admitting: Behavioral Health

## 2017-04-20 MED ORDER — RIVAROXABAN 20 MG PO TABS: 20.0000 mg | ORAL_TABLET | Freq: Every day | ORAL | 0 refills | Status: AC

## 2017-04-20 MED ORDER — PAROXETINE HCL 10 MG PO TABS: 10.0000 mg | ORAL_TABLET | Freq: Every day | ORAL | 0 refills | Status: DC

## 2017-04-20 MED ORDER — PAROXETINE HCL 10 MG PO TABS
10.0000 mg | ORAL_TABLET | Freq: Every day | ORAL | Status: DC
  Filled 2017-04-20: qty 7
  Filled 2017-04-20: qty 1

## 2017-04-20 MED ORDER — DULOXETINE HCL 40 MG PO CPEP: 40.0000 mg | ORAL_CAPSULE | Freq: Every day | ORAL | 0 refills | Status: DC

## 2017-04-20 MED ORDER — GABAPENTIN 300 MG PO CAPS: 300.0000 mg | ORAL_CAPSULE | Freq: Three times a day (TID) | ORAL | 0 refills | Status: DC

## 2017-04-20 MED ORDER — ARIPIPRAZOLE 5 MG PO TABS: 5.0000 mg | ORAL_TABLET | Freq: Two times a day (BID) | ORAL | 0 refills | Status: DC

## 2017-04-20 MED ORDER — HYDROXYZINE HCL 25 MG PO TABS: 25.0000 mg | ORAL_TABLET | Freq: Four times a day (QID) | ORAL | 0 refills | Status: DC | PRN

## 2017-04-20 MED ORDER — NICOTINE POLACRILEX 2 MG MT GUM: 2.0000 mg | CHEWING_GUM | OROMUCOSAL | 0 refills | Status: DC | PRN

## 2017-04-20 MED ORDER — TRAZODONE HCL 50 MG PO TABS: 50.0000 mg | ORAL_TABLET | Freq: Every evening | ORAL | 0 refills | Status: DC | PRN

## 2017-04-20 MED ORDER — CLONAZEPAM 1 MG PO TABS: 1.0000 mg | ORAL_TABLET | Freq: Two times a day (BID) | ORAL | 0 refills | Status: DC

## 2017-04-20 NOTE — Tx Team (Signed)
Interdisciplinary Treatment and Diagnostic Plan Update 04/20/2017 Time of Session: 9:30am  Janet Hester  MRN: [id number]  Principal Diagnosis: MDD (major depressive disorder), recurrent severe, without psychosis ([place])  Secondary Diagnoses: Principal Problem:   MDD (major depressive disorder), recurrent severe, without psychosis ([place])   Current Medications:  Current Facility-Administered Medications  Medication Dose Route Frequency Provider Last Rate Last Dose  . acetaminophen (TYLENOL) tablet 650 mg  650 mg Oral Q6H PRN Ethelene Hal, NP      . alum & mag hydroxide-simeth (MAALOX/MYLANTA) 200-200-20 MG/5ML suspension 30 mL  30 mL Oral Q4H PRN Ethelene Hal, NP      . ARIPiprazole (ABILIFY) tablet 5 mg  5 mg Oral BID Ambrose Finland, MD   5 mg at 04/20/17 0808  . clonazePAM (KLONOPIN) tablet 1 mg  1 mg Oral BID Ambrose Finland, MD   1 mg at 04/19/17 2050  . DULoxetine (CYMBALTA) DR capsule 40 mg  40 mg Oral Daily Ambrose Finland, MD   40 mg at 04/20/17 0808  . gabapentin (NEURONTIN) capsule 300 mg  300 mg Oral TID Ambrose Finland, MD   300 mg at 04/20/17 1152  . hydrOXYzine (ATARAX/VISTARIL) tablet 25 mg  25 mg Oral Q6H PRN Ethelene Hal, NP      . magnesium hydroxide (MILK OF MAGNESIA) suspension 30 mL  30 mL Oral Daily PRN Ethelene Hal, NP      . nicotine polacrilex (NICORETTE) gum 2 mg  2 mg Oral PRN Lindon Romp A, NP   2 mg at 04/19/17 2101  . [START ON 04/21/2017] PARoxetine (PAXIL) tablet 10 mg  10 mg Oral Daily Cobos, Fernando A, MD      . rivaroxaban (XARELTO) tablet 20 mg  20 mg Oral Q supper Ethelene Hal, NP   20 mg at 04/19/17 1724  . traZODone (DESYREL) tablet 50 mg  50 mg Oral QHS PRN Ethelene Hal, NP   50 mg at 04/19/17 2050    PTA Medications: Prescriptions Prior to Admission  Medication Sig Dispense Refill Last Dose  . acetaminophen (TYLENOL) 500 MG tablet Take 1,000 mg by mouth every  6 (six) hours as needed (For arthritis.).    04/15/2017  . buPROPion (WELLBUTRIN XL) 300 MG 24 hr tablet Take 300 mg by mouth at bedtime.    04/16/2017  . cetirizine (ZYRTEC) 10 MG tablet Take 10 mg by mouth daily as needed for allergies.   Past Week  . EPINEPHrine (EPIPEN) 0.3 mg/0.3 mL IJ SOAJ injection Inject 0.3 mLs (0.3 mg total) into the muscle once as needed. 1 Device 1 never used  . fluticasone (FLONASE SENSIMIST) 27.5 MCG/SPRAY nasal spray Place 1 spray into the nose daily as needed for allergies.   Past Week  . gabapentin (NEURONTIN) 300 MG capsule Take 900 mg by mouth at bedtime.    04/15/2017  . PARoxetine (PAXIL) 40 MG tablet Take 40 mg by mouth at bedtime.    04/15/2017  . rivaroxaban (XARELTO) 20 MG TABS tablet Take 20 mg by mouth every evening.    04/16/2017 at 7pm  . clonazePAM (KLONOPIN) 0.5 MG tablet Take 1 tablet (0.5 mg total) by mouth 2 (two) times daily as needed. for anxiety (Patient not taking: Reported on 04/17/2017) 60 tablet 5 Not Taking at Unknown time    Treatment Modalities: Medication Management, Group therapy, Case management,  1 to 1 session with clinician, Psychoeducation, Recreational therapy.  Patient Stressors: Occupational concerns Patient Strengths: Ability for insight Average or  above average intelligence Capable of independent living Communication skills Motivation for treatment/growth Supportive family/friends  Physician Treatment Plan for Primary Diagnosis: MDD (major depressive disorder), recurrent severe, without psychosis ([place]) Long Term Goal(s): Improvement in symptoms so as ready for discharge Short Term Goals: Ability to identify changes in lifestyle to reduce recurrence of condition will improve Ability to verbalize feelings will improve Ability to disclose and discuss suicidal ideas Ability to demonstrate self-control will improve Ability to identify and develop effective coping behaviors will improve Ability to maintain clinical measurements  within normal limits will improve Compliance with prescribed medications will improve Ability to identify triggers associated with substance abuse/mental health issues will improve  Medication Management: Evaluate patient's response, side effects, and tolerance of medication regimen.  Therapeutic Interventions: 1 to 1 sessions, Unit Group sessions and Medication administration.  Evaluation of Outcomes: Adequate for Discharge  Physician Treatment Plan for Secondary Diagnosis: Principal Problem:   MDD (major depressive disorder), recurrent severe, without psychosis ([place])  Long Term Goal(s): Improvement in symptoms so as ready for discharge  Short Term Goals: Ability to identify changes in lifestyle to reduce recurrence of condition will improve Ability to verbalize feelings will improve Ability to disclose and discuss suicidal ideas Ability to demonstrate self-control will improve Ability to identify and develop effective coping behaviors will improve Ability to maintain clinical measurements within normal limits will improve Compliance with prescribed medications will improve Ability to identify triggers associated with substance abuse/mental health issues will improve  Medication Management: Evaluate patient's response, side effects, and tolerance of medication regimen.  Therapeutic Interventions: 1 to 1 sessions, Unit Group sessions and Medication administration.  Evaluation of Outcomes: Adequate for Discharge  RN Treatment Plan for Primary Diagnosis: MDD (major depressive disorder), recurrent severe, without psychosis ([place]) Long Term Goal(s): Knowledge of disease and therapeutic regimen to maintain health will improve  Short Term Goals: Compliance with prescribed medications will improve  Medication Management: RN will administer medications as ordered by provider, will assess and evaluate patient's response and provide education to patient for prescribed medication. RN will  report any adverse and/or side effects to prescribing provider.  Therapeutic Interventions: 1 on 1 counseling sessions, Psychoeducation, Medication administration, Evaluate responses to treatment, Monitor vital signs and CBGs as ordered, Perform/monitor CIWA, COWS, AIMS and Fall Risk screenings as ordered, Perform wound care treatments as ordered.  Evaluation of Outcomes: Adequate for Discharge  LCSW Treatment Plan for Primary Diagnosis: MDD (major depressive disorder), recurrent severe, without psychosis ([place]) Long Term Goal(s): Safe transition to appropriate next level of care at discharge, Engage patient in therapeutic group addressing interpersonal concerns. Short Term Goals: Engage patient in aftercare planning with referrals and resources, Increase emotional regulation, Identify triggers associated with mental health/substance abuse issues and Increase skills for wellness and recovery  Therapeutic Interventions: Assess for all discharge needs, 1 to 1 time with Social worker, Explore available resources and support systems, Assess for adequacy in community support network, Educate family and significant other(s) on suicide prevention, Complete Psychosocial Assessment, Interpersonal group therapy.  Evaluation of Outcomes: Adequate for Discharge  Progress in Treatment: Attending groups: Yes  Participating in groups: Yes Taking medication as prescribed: Yes, MD continues to assess for medication changes as needed Toleration medication: Yes, no side effects reported at this time Family/Significant other contact made: Yes, pt's daughter contacted. Patient understands diagnosis: Yes, AEB pt's willingness to participate in treatment. Discussing patient identified problems/goals with staff: Yes Medical problems stabilized or resolved: Yes Denies suicidal/homicidal ideation: Yes Issues/concerns per patient  self-inventory: None Other: N/A  New problem(s) identified: None identified at this  time.   New Short Term/Long Term Goal(s): None identified at this time.   Discharge Plan or Barriers: Pt will return home and follow up outpatient with [facility].  Reason for Continuation of Hospitalization:  None identified at this time   Estimated Length of Stay: 0 days; Pt will likely discharge today 04/20/17.  Attendees: Patient: 04/20/2017 12:58 PM  Physician: Dr. Parke Poisson 04/20/2017 12:58 PM  Nursing:  04/20/2017 12:58 PM  RN Care Manager:  04/20/2017 12:58 PM  Social Worker: Matthew Saras, [place] 04/20/2017 12:58 PM  Recreational Therapist:  04/20/2017 12:58 PM  Other: Lindell Spar, NP 04/20/2017 12:58 PM  Other:  04/20/2017 12:58 PM  Other: 04/20/2017 12:58 PM  Scribe for Treatment Team: Georga Kaufmann, MSW,LCSWA 04/20/2017 12:58 PM
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
## 2017-04-20 NOTE — BHH Suicide Risk Assessment (Addendum)
[facility] Discharge Suicide Risk Assessment   Principal Problem: depression Discharge Diagnoses:  Patient Active Problem List   Diagnosis Date Noted  . MDD (major depressive disorder), recurrent severe, without psychosis ([place]) [F33.2] 04/16/2017  . Hyperlipidemia with target LDL less than 160 [E78.5] 11/14/2015  . Obesity (BMI 35.0-39.9 without comorbidity) [E66.9] 11/14/2015  . Tobacco abuse [Z72.0] 11/14/2015  . Lymphedema of lower extremity [I89.0] 03/22/2015  . Primary hypercoagulable state ([place]) [D68.59] 03/15/2015  . Routine general medical examination at a health care facility [Z00.00] 03/15/2015  . Visit for screening mammogram [Z12.31] 03/15/2015  . Panic anxiety syndrome [F41.0] 03/15/2015  . Herpes labialis [B00.1] 11/23/2013  . Trochanteric bursitis of right hip [M70.61] 10/27/2012  . DVT, lower extremity, distal ([place]) [I82.4Z9] 05/04/2012    Total Time spent with patient: 30 minutes  Musculoskeletal: Strength & Muscle Tone: within normal limits Gait & Station: normal Patient leans: N/A  Psychiatric Specialty Exam: ROS no headache, no chest pain, no shortness of breath, no vomiting, no bleeding, no melenas   Blood pressure 119/85, pulse 97, temperature 98.6 F (37 C), temperature source Oral, resp. rate 18, height 6' (1.829 m), weight 120.2 kg (265 lb), SpO2 100 %.Body mass index is 35.94 kg/m.  General Appearance: Well Groomed  Eye Contact::  Good  Speech:  Normal Rate409  Volume:  Normal  Mood:  improved, denies feeling depressed at this time, states mood is " normal"  Affect:  Appropriate and reactive   Thought Process:  Linear and Descriptions of Associations: Intact  Orientation:  Full (Time, Place, and Person)  Thought Content:  no hallucinations, no delusions, not internally preoccupied   Suicidal Thoughts:  No- denies any suicidal or self injurious ideations, denies any homicidal or violent ideations  Homicidal Thoughts:  No  Memory:  recent and remote  grossly intact   Judgement:  Other:  improved   Insight:  improved   Psychomotor Activity:  Normal  Concentration:  Good  Recall:  Good  Fund of Knowledge:Good  Language: Good  Akathisia:  Negative  Handed:  Right  AIMS (if indicated):     Assets:  Communication Skills Desire for Improvement Resilience  Sleep:  Number of Hours: 6.75  Cognition: WNL  ADL's:  Intact   Mental Status Per Nursing Assessment::   On Admission:  Suicidal ideation indicated by patient, Suicide plan, Self-harm thoughts, Self-harm behaviors  Demographic Factors:  Single, employed, was living alone prior to admission, but plans to go live with one of her daughters after discharge. Employed   Loss Factors: Work related stressors   Historical Factors: Prior psychiatric admission 2011 for depression, history of suicide ideations in the past   Risk Reduction Factors:   Employed, Living with another person, especially a relative, Positive social support and Positive coping skills or problem solving skills  Continued Clinical Symptoms:  At this time patient reports feeling better. She reports her mood is improved, her affect is reactive, smiles at times appropriately during session. Denies suicidal or self injurious ideations, no homicidal ideations no psychotic symptoms, future oriented. She has been visible on unit, going to groups . She is currently tolerating medications well . At this time she is weaning off Paxil. Will decrease dose to 10 mgrs daily, and instruction to stop Paxil in 5 days ( taper rather than D/C to minimize risk of SSRI withdrawal. Medication side effects reviewed, to include risk of movement disorders , metabolic disorders , increased risk of bleeding, particularly GI bleed, as on Xarelto.  Cognitive Features That Contribute To Risk:  No gross cognitive deficits noted upon discharge. Is alert , attentive, and oriented x 3   Suicide Risk:  Mild:  Suicidal ideation of limited  frequency, intensity, duration, and specificity.  There are no identifiable plans, no associated intent, mild dysphoria and related symptoms, good self-control (both objective and subjective assessment), few other risk factors, and identifiable protective factors, including available and accessible social support.    Plan Of Care/Follow-up recommendations:  Activity:  as tolerated  Diet:  regular  Tests:  NA Other:  See below  Patient is requesting discharge and there are no current grounds for involuntary commitment  She is leaving unit in good spirits  States daughter will be picking her up later today, plans to live with daughter for the time being  She has an established PCP for management of her medical issues, Dr. Antonieta Pert , at [facility]. I have reviewed the importance of checking Lipid Profile, Hgb A1C, BMI periodically since on current medication regimen. Patient expresses understanding Plans to follow up at [facility] for mental health management . Jenne Campus, MD 04/20/2017, 8:29 AM

## 2017-04-20 NOTE — Progress Notes (Signed)
Patient ID: Janet Hester, female   DOB: 12-17-1965, 51 y.o.   MRN: [id number] Discharge note:  Patient discharged home today.  Patient states her daughter "is on standby and will be picking me up."  Patient is sleeping and eating well; her concentration is good; her energy level is normal.  Patient rates her depression and hopelessness as a 0; anxiety as a 1.  Her goal is to "speak to the doctor so I can go home this morning.  Everything else is in place."  Patient denies any thoughts of self harm.  She is pleasant and her mood is stable.  She appears future oriented and is looking forward to discharge.  Reviewed AVS/transition record with patient and she indicated understanding.  Patient received prescriptions of her medications. Patient also received samples of her medications.  She received all personal belongings from unit and locker.  Patient left ambulatory with her daughter.

## 2017-04-20 NOTE — Progress Notes (Signed)
  [facility] Adult Case Management Discharge Plan :  Will you be returning to the same living situation after discharge:  Yes,  pt returning home with her daughter. At discharge, do you have transportation home?: Yes,  pt's family will provide transportation. Do you have the ability to pay for your medications: Yes,  perscriptions and samples provided.  Release of information consent forms completed and in the chart;  Patient's signature needed at discharge.  Patient to Follow up at: Follow-up Information    [facility] Follow up.   Specialty:  Behavioral Health Why:  Please go for a walk-in appointment within 7 days of discharge to be established for outpatient services. Walk-in hours are Mon-Fri 8am-3pm. Please arrive as early as possible to be sure that you are seen. Thank you. Contact information: [place] [address] [phone]           Next level of care provider has access to [place] and Suicide Prevention discussed: Yes,  with pt and with pt's daughter.  Have you used any form of tobacco in the last 30 days? (Cigarettes, Smokeless Tobacco, Cigars, and/or Pipes): Yes  Has patient been referred to the Quitline?: Patient refused referral  Patient has been referred for addiction treatment: Yes  Georga Kaufmann, MSW, LCSWA 04/20/2017, 12:49 PM

## 2017-04-20 NOTE — Progress Notes (Signed)
Recreation Therapy Notes  Date: 04/20/17 Time: 0930 Location: [address] Hall Dayroom  Group Topic: Stress Management  Goal Area(s) Addresses:  Patient will verbalize importance of using healthy stress management.  Patient will identify positive emotions associated with healthy stress management.   Intervention: Stress Management  Activity :  Guided Imagery.  LRT introduced the stress management technique of guided imagery.  LRT read a script that allowed patients to take a mental vacation through a summer meadow.  Patients were to follow along as script was read to engage in the activity.  Education: Stress Management, Discharge Planning.   Education Outcome: Acknowledges edcuation/In group clarification offered/Needs additional education  Clinical Observations/Feedback: Pt did not attend group.   Victorino Sparrow, LRT/CTRS        Ria Comment, Lavonna Lampron A 04/20/2017 1:26 PM

## 2017-04-20 NOTE — Progress Notes (Signed)
Patient has been up and active on the unit, attended group this evening and has voiced no complaints.She is hopeful to discharge on tomorrow. Patient currently denies having pain, -si/hi/a/v hall. Support and encouragement offered, safety maintained on unit, will continue to monitor.  

## 2017-04-23 NOTE — Social Work (Signed)
Call from daughter - states [facility] could not take [employer].  Wanted additional referral.  However, daughter states they were able to get appt tomorrow at [facility] and did not need any additional assistance.  Edwyna Shell, LCSW Lead Clinical Social Worker Phone:  [phone]

## 2017-04-24 ENCOUNTER — Encounter: Payer: Self-pay | Admitting: Psychology

## 2017-04-24 ENCOUNTER — Ambulatory Visit (INDEPENDENT_AMBULATORY_CARE_PROVIDER_SITE_OTHER): Payer: PRIVATE HEALTH INSURANCE | Admitting: Psychology

## 2017-04-24 DIAGNOSIS — F332 Major depressive disorder, recurrent severe without psychotic features: Secondary | ICD-10-CM

## 2017-04-26 ENCOUNTER — Encounter (HOSPITAL_COMMUNITY): Payer: Self-pay | Admitting: *Deleted

## 2017-04-26 ENCOUNTER — Emergency Department (HOSPITAL_COMMUNITY)
Admission: EM | Admit: 2017-04-26 | Discharge: 2017-04-26 | Disposition: A | Discharge status: Home / Self Care | Payer: PRIVATE HEALTH INSURANCE | Attending: Emergency Medicine | Admitting: Emergency Medicine

## 2017-04-26 DIAGNOSIS — F17210 Nicotine dependence, cigarettes, uncomplicated: Secondary | ICD-10-CM | POA: Insufficient documentation

## 2017-04-26 DIAGNOSIS — R05 Cough: Secondary | ICD-10-CM | POA: Diagnosis not present

## 2017-04-26 DIAGNOSIS — Z79899 Other long term (current) drug therapy: Secondary | ICD-10-CM | POA: Diagnosis not present

## 2017-04-26 DIAGNOSIS — Z7901 Long term (current) use of anticoagulants: Secondary | ICD-10-CM | POA: Insufficient documentation

## 2017-04-26 DIAGNOSIS — J029 Acute pharyngitis, unspecified: Secondary | ICD-10-CM | POA: Insufficient documentation

## 2017-04-26 LAB — RAPID STREP SCREEN (MED CTR MEBANE ONLY): Streptococcus, Group A Screen (Direct): NEGATIVE

## 2017-04-26 MED ORDER — BENZONATATE 100 MG PO CAPS: 100.0000 mg | ORAL_CAPSULE | Freq: Three times a day (TID) | ORAL | 0 refills | Status: DC | PRN

## 2017-04-26 MED ORDER — LIDOCAINE VISCOUS 2 % MT SOLN: 15.0000 mL | OROMUCOSAL | 0 refills | Status: DC | PRN

## 2017-04-26 NOTE — ED Provider Notes (Signed)
[facility] DEPT Provider Note   CSN: [id number] Arrival date & time: 04/26/17  0522     History   Chief Complaint Chief Complaint  Patient presents with  . Sore Throat    HPI Janet Hester is a 51 y.o. female.  The history is provided by the patient and medical records. No language interpreter was used.   Janet Hester is a 51 y.o. female  with a PMH of DVT on xarelto who presents to the Emergency Department complaining of progressively worsening sore throat 2-3 days. Pain has worse with swallowing and coughing. Associated symptoms include dry cough, nasal congestion and left ear pain. She has tried Tylenol and cough drops with little improvement. Denies sick contacts. She is scheduled to fly to [place] to visit family tomorrow and wants to get checked out before going out of town. Denies fever, chills, shortness of breath, abdominal pain, chest pain.   Past Medical History:  Diagnosis Date  . Arthritis   . Bursitis   . Bursitis   . Depression   . DVT, lower extremity, distal ([place]) 05/04/2012   DVT in a branch off of LEFT prox popliteal vein, mid posterior tibial vein, and upper calf of the peroneal vein.   . Shingles   . Thrombus     Patient Active Problem List   Diagnosis Date Noted  . MDD (major depressive disorder), recurrent severe, without psychosis ([place]) 04/16/2017  . Hyperlipidemia with target LDL less than 160 11/14/2015  . Obesity (BMI 35.0-39.9 without comorbidity) 11/14/2015  . Tobacco abuse 11/14/2015  . Lymphedema of lower extremity 03/22/2015  . Primary hypercoagulable state ([place]) 03/15/2015  . Routine general medical examination at a health care facility 03/15/2015  . Visit for screening mammogram 03/15/2015  . Panic anxiety syndrome 03/15/2015  . Herpes labialis 11/23/2013  . Trochanteric bursitis of right hip 10/27/2012  . DVT, lower extremity, distal ([place]) 05/04/2012    Past Surgical History:  Procedure Laterality Date  . LASER ABLATION       OB History    No data available       Home Medications    Prior to Admission medications   Medication Sig Start Date End Date Taking? Authorizing Provider  acetaminophen (TYLENOL) 500 MG tablet Take 1,000 mg by mouth every 6 (six) hours as needed (For arthritis.).     [provider]  ARIPiprazole (ABILIFY) 5 MG tablet Take 1 tablet (5 mg total) by mouth 2 (two) times daily. 04/20/17   Mordecai Maes, NP  benzonatate (TESSALON) 100 MG capsule Take 1 capsule (100 mg total) by mouth 3 (three) times daily as needed for cough. 04/26/17   Random Dobrowski, Ozella Almond, PA-C  cetirizine (ZYRTEC) 10 MG tablet Take 10 mg by mouth daily as needed for allergies.    [provider]  clonazePAM (KLONOPIN) 1 MG tablet Take 1 tablet (1 mg total) by mouth 2 (two) times daily. 04/20/17   Mordecai Maes, NP  DULoxetine 40 MG CPEP Take 40 mg by mouth daily. 04/21/17   Mordecai Maes, NP  EPINEPHrine (EPIPEN) 0.3 mg/0.3 mL IJ SOAJ injection Inject 0.3 mLs (0.3 mg total) into the muscle once as needed. 05/15/14   Janith Lima, MD  fluticasone (FLONASE SENSIMIST) 27.5 MCG/SPRAY nasal spray Place 1 spray into the nose daily as needed for allergies.    [provider]  gabapentin (NEURONTIN) 300 MG capsule Take 1 capsule (300 mg total) by mouth 3 (three) times daily. 04/20/17   Mordecai Maes, NP  hydrOXYzine (ATARAX/VISTARIL) 25 MG tablet Take 1 tablet (25 mg total) by mouth every 6 (six) hours as needed for anxiety. 04/20/17   Mordecai Maes, NP  lidocaine (XYLOCAINE) 2 % solution Use as directed 15 mLs in the mouth or throat as needed for mouth pain. 04/26/17   Kasee Hantz, Ozella Almond, PA-C  nicotine polacrilex (NICORETTE) 2 MG gum Take 1 each (2 mg total) by mouth as needed for smoking cessation. 04/20/17   Mordecai Maes, NP  PARoxetine (PAXIL) 10 MG tablet Take 1 tablet (10 mg total) by mouth daily. (Take this medicine for 5 days, then stop): For depression. 04/21/17   Lindell Spar I, NP   rivaroxaban (XARELTO) 20 MG TABS tablet Take 1 tablet (20 mg total) by mouth daily with supper. 04/20/17   Mordecai Maes, NP  traZODone (DESYREL) 50 MG tablet Take 1 tablet (50 mg total) by mouth at bedtime as needed for sleep. 04/20/17   Mordecai Maes, NP    Family History Family History  Problem Relation Age of Onset  . Pulmonary embolism Mother   . Cancer Mother 71       cervical cancer  . Deep vein thrombosis Father   . Deep vein thrombosis Paternal Aunt   . Deep vein thrombosis Paternal Uncle   . Deep vein thrombosis Daughter   . Pulmonary embolism Daughter   . Deep vein thrombosis Daughter   . Colon cancer Neg Hx     Social History Social History  Substance Use Topics  . Smoking status: Current Every Day Smoker    Packs/day: 1.00    Years: 20.00  . Smokeless tobacco: Never Used  . Alcohol use No     Allergies   Shellfish-derived products   Review of Systems Review of Systems  Constitutional: Negative for chills and fever.  HENT: Positive for congestion, ear pain and sore throat. Negative for dental problem, facial swelling and sinus pressure.   Respiratory: Positive for cough. Negative for shortness of breath.   Cardiovascular: Negative for chest pain.  Gastrointestinal: Negative for abdominal pain.  Musculoskeletal: Negative for back pain.     Physical Exam Updated Vital Signs BP (!) 151/80 (BP Location: Right Arm)   Pulse 84   Temp 97.7 F (36.5 C) (Oral)   Resp 18   Physical Exam  Constitutional: She is oriented to person, place, and time. She appears well-developed and well-nourished. No distress.  HENT:  Head: Normocephalic and atraumatic.  OP with erythema, no exudates or tonsillar hypertrophy. + nasal congestion with mucosal edema. No focal area of sinus tenderness.  Neck: Normal range of motion. Neck supple.  No meningeal signs.   Cardiovascular: Normal rate, regular rhythm and normal heart sounds.   Pulmonary/Chest: Effort normal.   Lungs are clear to auscultation bilaterally - no w/r/r  Abdominal: Soft. She exhibits no distension. There is no tenderness.  Musculoskeletal: Normal range of motion.  Neurological: She is alert and oriented to person, place, and time.  Skin: Skin is warm and dry. She is not diaphoretic.  Nursing note and vitals reviewed.    ED Treatments / Results  Labs (all labs ordered are listed, but only abnormal results are displayed) Labs Reviewed  RAPID STREP SCREEN (NOT AT [facility])  CULTURE, GROUP A STREP [facility])    EKG  EKG Interpretation None       Radiology No results found.  Procedures Procedures (including critical care time)  Medications Ordered in ED Medications - No data to display   Initial Impression /  Assessment and Plan / ED Course  I have reviewed the triage vital signs and the nursing notes.  Pertinent labs & imaging results that were available during my care of the patient were reviewed by me and considered in my medical decision making (see chart for details).    Janet Hester is a 51 y.o. female who presents to ED for cough, congestion and sore throat x 2-3 days. On exam, patient is afebrile, non-toxic appearing with a clear lung exam. Mild rhinorrhea and OP with erythema but no exudates or tonsillar hypertrophy. Rapid strep negative. Sxs today likely due to viral URI. Symptomatic home care instructions discussed. Rx for tessalon and viscous lidocaine given. PCP follow up strongly encouraged if symptoms persist. Reasons to return to ER discussed. All questions answered.   Blood pressure (!) 151/80, pulse 84, temperature 97.7 F (36.5 C), temperature source Oral, resp. rate 18.   Final Clinical Impressions(s) / ED Diagnoses   Final diagnoses:  Viral pharyngitis    New Prescriptions New Prescriptions   BENZONATATE (TESSALON) 100 MG CAPSULE    Take 1 capsule (100 mg total) by mouth 3 (three) times daily as needed for cough.   LIDOCAINE (XYLOCAINE) 2 %  SOLUTION    Use as directed 15 mLs in the mouth or throat as needed for mouth pain.     Janet Hester, Ozella Almond, PA-C 34/37/35 [id number]    Delora Fuel, MD 84/78/41 [phone]

## 2017-04-26 NOTE — Discharge Instructions (Signed)
It was my pleasure taking care of you today! I hope you have a great time visiting family in [place] and get to feeling better soon.   Viscous lidocaine and Tylenol as needed for sore throat. Tessalon as needed for cough. Over-the-counter Mucinex for congestion.   Follow up with your primary care doctor if symptoms do not improve in 1 week.   Return to ER for new or worsening symptoms, any additional concerns.

## 2017-04-26 NOTE — ED Triage Notes (Signed)
Pt c/o sore throat x 2 days, "feels like I'm swallowing razor blades."

## 2017-04-28 LAB — CULTURE, GROUP A STREP (THRC)

## 2017-05-19 ENCOUNTER — Encounter: Payer: Self-pay | Admitting: Psychology

## 2017-05-19 ENCOUNTER — Ambulatory Visit (INDEPENDENT_AMBULATORY_CARE_PROVIDER_SITE_OTHER): Payer: PRIVATE HEALTH INSURANCE | Admitting: Psychology

## 2017-05-19 DIAGNOSIS — F332 Major depressive disorder, recurrent severe without psychotic features: Secondary | ICD-10-CM

## 2017-06-02 ENCOUNTER — Ambulatory Visit (INDEPENDENT_AMBULATORY_CARE_PROVIDER_SITE_OTHER): Payer: PRIVATE HEALTH INSURANCE | Admitting: Psychology

## 2017-06-02 ENCOUNTER — Encounter: Payer: Self-pay | Admitting: Psychology

## 2017-06-02 DIAGNOSIS — F332 Major depressive disorder, recurrent severe without psychotic features: Secondary | ICD-10-CM | POA: Diagnosis not present

## 2017-06-12 ENCOUNTER — Encounter (HOSPITAL_COMMUNITY): Payer: Self-pay | Admitting: Psychiatry

## 2017-06-12 ENCOUNTER — Ambulatory Visit (INDEPENDENT_AMBULATORY_CARE_PROVIDER_SITE_OTHER): Payer: PRIVATE HEALTH INSURANCE | Admitting: Psychiatry

## 2017-06-12 VITALS — BP 142/80 | HR 84 | Ht 72.0 in | Wt 262.8 lb

## 2017-06-12 DIAGNOSIS — Z79899 Other long term (current) drug therapy: Secondary | ICD-10-CM | POA: Diagnosis not present

## 2017-06-12 DIAGNOSIS — F17210 Nicotine dependence, cigarettes, uncomplicated: Secondary | ICD-10-CM | POA: Diagnosis not present

## 2017-06-12 DIAGNOSIS — F3130 Bipolar disorder, current episode depressed, mild or moderate severity, unspecified: Secondary | ICD-10-CM | POA: Diagnosis not present

## 2017-06-12 DIAGNOSIS — F411 Generalized anxiety disorder: Secondary | ICD-10-CM | POA: Diagnosis not present

## 2017-06-12 DIAGNOSIS — Z91410 Personal history of adult physical and sexual abuse: Secondary | ICD-10-CM

## 2017-06-12 DIAGNOSIS — Z91411 Personal history of adult psychological abuse: Secondary | ICD-10-CM

## 2017-06-12 MED ORDER — ARIPIPRAZOLE 10 MG PO TABS: 10.0000 mg | ORAL_TABLET | Freq: Every day | ORAL | 1 refills | Status: DC

## 2017-06-12 MED ORDER — DULOXETINE HCL 40 MG PO CPEP: 40.0000 mg | ORAL_CAPSULE | Freq: Every day | ORAL | 1 refills | Status: DC

## 2017-06-12 MED ORDER — HYDROXYZINE HCL 25 MG PO TABS: 25.0000 mg | ORAL_TABLET | Freq: Four times a day (QID) | ORAL | 0 refills | Status: DC | PRN

## 2017-06-12 MED ORDER — CLONAZEPAM 1 MG PO TABS: ORAL_TABLET | ORAL | 0 refills | Status: DC

## 2017-06-12 MED ORDER — TRAZODONE HCL 50 MG PO TABS: 50.0000 mg | ORAL_TABLET | Freq: Every evening | ORAL | 1 refills | Status: DC | PRN
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
## 2017-06-16 ENCOUNTER — Telehealth: Payer: Self-pay | Admitting: *Deleted

## 2017-06-16 NOTE — Telephone Encounter (Signed)
Received request for Medical records from [facility]; forwarded to [place] for email/scan/SLS 09/04

## 2017-06-22 ENCOUNTER — Ambulatory Visit (INDEPENDENT_AMBULATORY_CARE_PROVIDER_SITE_OTHER): Payer: PRIVATE HEALTH INSURANCE | Admitting: Psychology

## 2017-06-22 DIAGNOSIS — F332 Major depressive disorder, recurrent severe without psychotic features: Secondary | ICD-10-CM | POA: Diagnosis not present

## 2017-07-15 ENCOUNTER — Ambulatory Visit (INDEPENDENT_AMBULATORY_CARE_PROVIDER_SITE_OTHER): Payer: PRIVATE HEALTH INSURANCE | Admitting: Psychology

## 2017-07-15 DIAGNOSIS — F332 Major depressive disorder, recurrent severe without psychotic features: Secondary | ICD-10-CM

## 2017-07-24 ENCOUNTER — Encounter (HOSPITAL_COMMUNITY): Payer: Self-pay | Admitting: Psychiatry

## 2017-07-24 ENCOUNTER — Other Ambulatory Visit (HOSPITAL_COMMUNITY): Payer: Self-pay

## 2017-07-24 ENCOUNTER — Ambulatory Visit (INDEPENDENT_AMBULATORY_CARE_PROVIDER_SITE_OTHER): Payer: PRIVATE HEALTH INSURANCE | Admitting: Psychiatry

## 2017-07-24 VITALS — BP 126/78 | HR 79 | Ht 72.0 in | Wt 268.0 lb

## 2017-07-24 DIAGNOSIS — G4700 Insomnia, unspecified: Secondary | ICD-10-CM | POA: Diagnosis not present

## 2017-07-24 DIAGNOSIS — R450 Nervousness: Secondary | ICD-10-CM | POA: Diagnosis not present

## 2017-07-24 DIAGNOSIS — F3130 Bipolar disorder, current episode depressed, mild or moderate severity, unspecified: Secondary | ICD-10-CM | POA: Diagnosis not present

## 2017-07-24 DIAGNOSIS — F411 Generalized anxiety disorder: Secondary | ICD-10-CM | POA: Diagnosis not present

## 2017-07-24 DIAGNOSIS — F17210 Nicotine dependence, cigarettes, uncomplicated: Secondary | ICD-10-CM | POA: Diagnosis not present

## 2017-07-24 DIAGNOSIS — Z79899 Other long term (current) drug therapy: Secondary | ICD-10-CM

## 2017-07-24 MED ORDER — ARIPIPRAZOLE 10 MG PO TABS: 10.0000 mg | ORAL_TABLET | Freq: Every day | ORAL | 1 refills | Status: DC

## 2017-07-24 MED ORDER — DULOXETINE HCL 60 MG PO CPEP: 60.0000 mg | ORAL_CAPSULE | Freq: Every day | ORAL | 1 refills | Status: DC

## 2017-07-24 MED ORDER — TRAZODONE HCL 100 MG PO TABS: 100.0000 mg | ORAL_TABLET | Freq: Every evening | ORAL | 1 refills | Status: DC | PRN

## 2017-07-24 MED ORDER — HYDROXYZINE HCL 25 MG PO TABS: 25.0000 mg | ORAL_TABLET | Freq: Every day | ORAL | 1 refills | Status: DC | PRN
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
## 2017-07-26 LAB — CBC WITH DIFFERENTIAL/PLATELET
BASOS: 0 %
Basophils Absolute: 0 10*3/uL (ref 0.0–0.2)
EOS (ABSOLUTE): 0.1 10*3/uL (ref 0.0–0.4)
EOS: 2 %
HEMATOCRIT: 38.9 % (ref 34.0–46.6)
HEMOGLOBIN: 12.9 g/dL (ref 11.1–15.9)
Immature Grans (Abs): 0 10*3/uL (ref 0.0–0.1)
Immature Granulocytes: 0 %
LYMPHS ABS: 2.3 10*3/uL (ref 0.7–3.1)
Lymphs: 37 %
MCH: 29.8 pg (ref 26.6–33.0)
MCHC: 33.2 g/dL (ref 31.5–35.7)
MCV: 90 fL (ref 79–97)
MONOCYTES: 5 %
MONOS ABS: 0.3 10*3/uL (ref 0.1–0.9)
NEUTROS ABS: 3.4 10*3/uL (ref 1.4–7.0)
Neutrophils: 56 %
Platelets: 276 10*3/uL (ref 150–379)
RBC: 4.33 x10E6/uL (ref 3.77–5.28)
RDW: 14 % (ref 12.3–15.4)
WBC: 6.2 10*3/uL (ref 3.4–10.8)

## 2017-07-26 LAB — COMPREHENSIVE METABOLIC PANEL
A/G RATIO: 1.5 (ref 1.2–2.2)
ALT: 16 IU/L (ref 0–32)
AST: 25 IU/L (ref 0–40)
Albumin: 4.4 g/dL (ref 3.5–5.5)
Alkaline Phosphatase: 70 IU/L (ref 39–117)
BUN/Creatinine Ratio: 13 (ref 9–23)
BUN: 12 mg/dL (ref 6–24)
Bilirubin Total: 0.4 mg/dL (ref 0.0–1.2)
CALCIUM: 9.3 mg/dL (ref 8.7–10.2)
CO2: 20 mmol/L (ref 20–29)
CREATININE: 0.92 mg/dL (ref 0.57–1.00)
Chloride: 104 mmol/L (ref 96–106)
GFR, EST AFRICAN AMERICAN: 83 mL/min/{1.73_m2} (ref >59)
GFR, EST NON AFRICAN AMERICAN: 72 mL/min/{1.73_m2} (ref >59)
GLOBULIN, TOTAL: 2.9 g/dL (ref 1.5–4.5)
Glucose: 102 mg/dL — ABNORMAL HIGH (ref 65–99)
Potassium: 5.1 mmol/L (ref 3.5–5.2)
SODIUM: 141 mmol/L (ref 134–144)
TOTAL PROTEIN: 7.3 g/dL (ref 6.0–8.5)

## 2017-07-26 LAB — HEMOGLOBIN A1C
Est. average glucose Bld gHb Est-mCnc: 97 mg/dL
HEMOGLOBIN A1C: 5 % (ref 4.8–5.6)

## 2017-07-26 LAB — TSH: TSH: 1 u[IU]/mL (ref 0.450–4.500)

## 2017-07-27 ENCOUNTER — Other Ambulatory Visit (HOSPITAL_COMMUNITY): Payer: Self-pay

## 2017-07-27 DIAGNOSIS — F3130 Bipolar disorder, current episode depressed, mild or moderate severity, unspecified: Secondary | ICD-10-CM

## 2017-07-27 DIAGNOSIS — F411 Generalized anxiety disorder: Secondary | ICD-10-CM

## 2017-07-27 MED ORDER — DULOXETINE HCL 60 MG PO CPEP: 60.0000 mg | ORAL_CAPSULE | Freq: Every day | ORAL | 1 refills | Status: DC

## 2017-07-27 MED ORDER — ARIPIPRAZOLE 10 MG PO TABS: 10.0000 mg | ORAL_TABLET | Freq: Every day | ORAL | 1 refills | Status: DC

## 2017-07-27 MED ORDER — TRAZODONE HCL 100 MG PO TABS: 100.0000 mg | ORAL_TABLET | Freq: Every evening | ORAL | 1 refills | Status: DC | PRN

## 2017-07-27 MED ORDER — HYDROXYZINE HCL 25 MG PO TABS: 25.0000 mg | ORAL_TABLET | Freq: Every day | ORAL | 1 refills | Status: DC | PRN

## 2017-08-05 ENCOUNTER — Ambulatory Visit (INDEPENDENT_AMBULATORY_CARE_PROVIDER_SITE_OTHER): Payer: PRIVATE HEALTH INSURANCE | Admitting: Psychology

## 2017-08-05 DIAGNOSIS — F332 Major depressive disorder, recurrent severe without psychotic features: Secondary | ICD-10-CM

## 2017-08-26 ENCOUNTER — Ambulatory Visit: Payer: Self-pay | Admitting: Psychology

## 2017-09-25 ENCOUNTER — Ambulatory Visit (HOSPITAL_COMMUNITY): Payer: Self-pay | Admitting: Psychiatry

## 2017-10-24 ENCOUNTER — Other Ambulatory Visit (HOSPITAL_COMMUNITY): Payer: Self-pay | Admitting: Psychiatry

## 2017-10-24 DIAGNOSIS — F411 Generalized anxiety disorder: Secondary | ICD-10-CM

## 2017-10-29 ENCOUNTER — Other Ambulatory Visit (HOSPITAL_COMMUNITY): Payer: Self-pay | Admitting: Psychiatry

## 2017-10-29 DIAGNOSIS — F411 Generalized anxiety disorder: Secondary | ICD-10-CM

## 2017-10-29 MED ORDER — HYDROXYZINE HCL 25 MG PO TABS: 25.0000 mg | ORAL_TABLET | Freq: Every day | ORAL | 0 refills | Status: DC | PRN

## 2017-11-16 ENCOUNTER — Encounter (HOSPITAL_COMMUNITY): Payer: Self-pay | Admitting: Psychiatry

## 2017-11-16 ENCOUNTER — Ambulatory Visit (INDEPENDENT_AMBULATORY_CARE_PROVIDER_SITE_OTHER): Payer: Medicaid Other | Admitting: Psychiatry

## 2017-11-16 DIAGNOSIS — F411 Generalized anxiety disorder: Secondary | ICD-10-CM | POA: Diagnosis not present

## 2017-11-16 DIAGNOSIS — Z915 Personal history of self-harm: Secondary | ICD-10-CM | POA: Diagnosis not present

## 2017-11-16 DIAGNOSIS — Z560 Unemployment, unspecified: Secondary | ICD-10-CM | POA: Diagnosis not present

## 2017-11-16 DIAGNOSIS — F3130 Bipolar disorder, current episode depressed, mild or moderate severity, unspecified: Secondary | ICD-10-CM | POA: Diagnosis not present

## 2017-11-16 DIAGNOSIS — F17210 Nicotine dependence, cigarettes, uncomplicated: Secondary | ICD-10-CM | POA: Diagnosis not present

## 2017-11-16 MED ORDER — HYDROXYZINE HCL 25 MG PO TABS: 25.0000 mg | ORAL_TABLET | Freq: Every day | ORAL | 2 refills | Status: DC | PRN

## 2017-11-16 MED ORDER — DULOXETINE HCL 60 MG PO CPEP: 60.0000 mg | ORAL_CAPSULE | Freq: Every day | ORAL | 2 refills | Status: DC

## 2017-11-16 MED ORDER — TRAZODONE HCL 100 MG PO TABS: 100.0000 mg | ORAL_TABLET | Freq: Every evening | ORAL | 2 refills | Status: DC | PRN

## 2017-11-16 MED ORDER — ARIPIPRAZOLE 10 MG PO TABS: 10.0000 mg | ORAL_TABLET | Freq: Every day | ORAL | 2 refills | Status: DC
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
## 2017-11-25 ENCOUNTER — Emergency Department (HOSPITAL_BASED_OUTPATIENT_CLINIC_OR_DEPARTMENT_OTHER)
Admit: 2017-11-25 | Discharge: 2017-11-25 | Disposition: A | Discharge status: Home / Self Care | Payer: Medicaid Other | Attending: Emergency Medicine | Admitting: Emergency Medicine

## 2017-11-25 ENCOUNTER — Emergency Department (HOSPITAL_COMMUNITY)
Admission: EM | Admit: 2017-11-25 | Discharge: 2017-11-25 | Disposition: A | Discharge status: Home / Self Care | Payer: Medicaid Other | Attending: Emergency Medicine | Admitting: Emergency Medicine

## 2017-11-25 ENCOUNTER — Encounter (HOSPITAL_COMMUNITY): Payer: Self-pay

## 2017-11-25 DIAGNOSIS — M79604 Pain in right leg: Secondary | ICD-10-CM

## 2017-11-25 DIAGNOSIS — M7989 Other specified soft tissue disorders: Secondary | ICD-10-CM | POA: Diagnosis not present

## 2017-11-25 DIAGNOSIS — M79661 Pain in right lower leg: Secondary | ICD-10-CM | POA: Insufficient documentation

## 2017-11-25 DIAGNOSIS — Z79899 Other long term (current) drug therapy: Secondary | ICD-10-CM | POA: Diagnosis not present

## 2017-11-25 DIAGNOSIS — M79662 Pain in left lower leg: Secondary | ICD-10-CM | POA: Insufficient documentation

## 2017-11-25 DIAGNOSIS — F17210 Nicotine dependence, cigarettes, uncomplicated: Secondary | ICD-10-CM | POA: Insufficient documentation

## 2017-11-25 DIAGNOSIS — M79605 Pain in left leg: Secondary | ICD-10-CM

## 2017-11-25 NOTE — ED Provider Notes (Signed)
[facility] EMERGENCY DEPARTMENT Provider Note   CSN: [id number] Arrival date & time: 11/25/17  1829     History   Chief Complaint Chief Complaint  Patient presents with  . Leg Pain    HPI Janet Hester is a 51 y.o. female.  Patient w hx dvt, c/o bilateral lower leg pain/swelling for past week. Denies injury to area. No chest pain or sob. Symptoms mild, persistent, without specific exacerbation or alleviating factors. States occasionally bilateral feet/lower legs feel numb/tingly. No back pain or radicular pain. No fever or chills. Pt is concerned w dvt and requests u/s study.    The history is provided by the patient.  Leg Pain      Past Medical History:  Diagnosis Date  . Arthritis   . Bursitis   . Bursitis   . Depression   . DVT, lower extremity, distal ([place]) 05/04/2012   DVT in a branch off of LEFT prox popliteal vein, mid posterior tibial vein, and upper calf of the peroneal vein.   . Shingles   . Thrombus     Patient Active Problem List   Diagnosis Date Noted  . 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 Hyperlipidemia with target LDL less than 160 11/14/2015  . Obesity (BMI 35.0-39.9 without comorbidity) 11/14/2015  . Tobacco abuse 11/14/2015  . Lymphedema of lower extremity 03/22/2015  . Primary hypercoagulable state ([place]) 03/15/2015  . Routine general medical examination at a health care facility 03/15/2015  . Visit for screening mammogram 03/15/2015  . Panic anxiety syndrome 03/15/2015  . Herpes labialis 11/23/2013  . Trochanteric bursitis of right hip 10/27/2012  . DVT, lower extremity, distal ([place]) 05/04/2012    Past Surgical History:  Procedure Laterality Date  . APPENDECTOMY    . LASER ABLATION      OB History    No data available       Home Medications    Prior to Admission medications   Medication Sig Start Date End Date Taking? Authorizing Provider  ARIPiprazole  (ABILIFY) 10 MG tablet Take 1 tablet (10 mg total) by mouth daily. 11/16/17   Arfeen, Arlyce Harman, MD  cetirizine (ZYRTEC) 10 MG tablet Take 10 mg by mouth daily as needed for allergies.    [provider]  DULoxetine (CYMBALTA) 60 MG capsule Take 1 capsule (60 mg total) by mouth daily. 11/16/17 11/16/18  Arfeen, Arlyce Harman, MD  EPINEPHrine (EPIPEN) 0.3 mg/0.3 mL IJ SOAJ injection Inject 0.3 mLs (0.3 mg total) into the muscle once as needed. 05/15/14   Janith Lima, MD  fluticasone (FLONASE SENSIMIST) 27.5 MCG/SPRAY nasal spray Place 1 spray into the nose daily as needed for allergies.    [provider]  gabapentin (NEURONTIN) 300 MG capsule Take 1 capsule (300 mg total) by mouth 3 (three) times daily. 04/20/17   Mordecai Maes, NP  hydrOXYzine (ATARAX/VISTARIL) 25 MG tablet Take 1 tablet (25 mg total) by mouth daily as needed for anxiety. 11/16/17   Arfeen, Arlyce Harman, MD  rivaroxaban (XARELTO) 20 MG TABS tablet Take 1 tablet (20 mg total) by mouth daily with supper. 04/20/17   Mordecai Maes, NP  traZODone (DESYREL) 100 MG tablet Take 1 tablet (100 mg total) by mouth at bedtime as needed for sleep. 11/16/17   Arfeen, Arlyce Harman, MD    Family History Family History  Problem Relation Age of Onset  . Pulmonary embolism Mother   . Cancer Mother 20  cervical cancer  . Deep vein thrombosis Father   . Deep vein thrombosis Paternal Aunt   . Deep vein thrombosis Paternal Uncle   . Deep vein thrombosis Daughter   . Pulmonary embolism Daughter   . Deep vein thrombosis Daughter   . Colon cancer Neg Hx     Social History Social History   Tobacco Use  . Smoking status: Current Every Day Smoker    Packs/day: 1.00    Years: 20.00    Pack years: 20.00    Types: Cigarettes  . Smokeless tobacco: Never Used  Substance Use Topics  . Alcohol use: No    Alcohol/week: 0.0 oz  . Drug use: No     Allergies   Shellfish-derived products   Review of Systems Review of Systems  Constitutional:  Negative for fever.  HENT: Negative for sore throat.   Eyes: Negative for redness.  Respiratory: Negative for shortness of breath.   Cardiovascular: Negative for chest pain.  Gastrointestinal: Negative for abdominal pain.  Genitourinary: Negative for flank pain.  Musculoskeletal: Negative for back pain.  Skin: Negative for rash.  Neurological: Negative for headaches.  Hematological: Does not bruise/bleed easily.  Psychiatric/Behavioral: Negative for confusion.     Physical Exam Updated Vital Signs BP (!) 131/93 (BP Location: Right Arm)   Pulse 97   Temp 98.3 F (36.8 C) (Oral)   Resp 18   Ht 1.829 m (6')   Wt 122.5 kg (270 lb)   SpO2 92%   BMI 36.62 kg/m   Physical Exam  Constitutional: She appears well-developed and well-nourished. No distress.  HENT:  Head: Atraumatic.  Eyes: Conjunctivae are normal. No scleral icterus.  Neck: Neck supple. No tracheal deviation present.  Cardiovascular: Normal rate, regular rhythm and intact distal pulses.  Pulmonary/Chest: Effort normal and breath sounds normal. No respiratory distress.  Abdominal: Normal appearance. She exhibits no distension.  Musculoskeletal: She exhibits no edema.  Good rom at bil knees and ankles. Distal pulses palp bil legs. No skin changes, erythema, lesions or rash.   Neurological: She is alert.  Speech normal. Motor intact bil legs. Sens grossly intact. Steady gait.   Skin: Skin is warm and dry. No rash noted. She is not diaphoretic.  Psychiatric: She has a normal mood and affect.  Nursing note and vitals reviewed.    ED Treatments / Results  Labs (all labs ordered are listed, but only abnormal results are displayed) Labs Reviewed - No data to display  EKG  EKG Interpretation None       Radiology No results found.  Procedures Procedures (including critical care time)  Medications Ordered in ED Medications - No data to display   Initial Impression / Assessment and Plan / ED Course  I  have reviewed the triage vital signs and the nursing notes.  Pertinent labs & imaging results that were available during my care of the patient were reviewed by me and considered in my medical decision making (see chart for details).  Reviewed nursing notes and prior charts for additional history.   Vascular study ordered.     Legrand Como, RVT  Cardiovascular Sonographer  Vascular Lab  Progress Notes  Signed  Date of Service:  11/25/2017 10:51 AM          Signed           [] Hide copied text  [] Hover for details   Bilateral lower extremity venous duplex has been completed. Negative for DVT. Results were given to Dr. Ashok Cordia.  11/25/17 10:51 AM Carlos Levering RVT           Electronically signed by Legrand Como, RVT at 11/25/2017 10:54 AM    Study neg for dvt.   No EN on exam. Legs appear normal.  Patient stable for d/c.    rec pcp f/u.     Final Clinical Impressions(s) / ED Diagnoses   Final diagnoses:  None    ED Discharge Orders    None       Lajean Saver, MD 11/25/17 1058

## 2017-11-25 NOTE — Progress Notes (Signed)
Bilateral lower extremity venous duplex has been completed. Negative for DVT. Results were given to Dr. Ashok Cordia.  11/25/17 10:51 AM Janet Hester RVT

## 2017-11-25 NOTE — ED Triage Notes (Signed)
Per Pt, Pt has noted intermittent leg numbness bilaterally for the past week with some "knots" noted to the front of the right leg. Pt reports that sshe has some pain in her right hip as well. 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

## 2017-11-25 NOTE — Discharge Instructions (Signed)
It was our pleasure to provide your ER care today - we hope that you feel better.  The vascular team indicates that your study looks normal - there is no DVT or other abnormality noted.   Take acetaminophen as need for pain.  Follow up with primary care doctor in 1 week for recheck.

## 2017-12-08 ENCOUNTER — Telehealth (HOSPITAL_COMMUNITY): Payer: Self-pay

## 2017-12-08 ENCOUNTER — Other Ambulatory Visit (HOSPITAL_COMMUNITY): Payer: Self-pay | Admitting: Psychiatry

## 2017-12-08 ENCOUNTER — Ambulatory Visit: Payer: Self-pay | Admitting: Internal Medicine

## 2017-12-08 NOTE — Telephone Encounter (Signed)
She can stop the trazodone and try Depakote 250 mg at bedtime

## 2017-12-08 NOTE — Telephone Encounter (Signed)
Patient is calling because she is having trouble sleeping. She states that the Trazodone is not working and she feels like she may be getting manic. Please review and advise, thank you

## 2017-12-10 ENCOUNTER — Other Ambulatory Visit (HOSPITAL_COMMUNITY): Payer: Self-pay

## 2017-12-10 MED ORDER — DIVALPROEX SODIUM 250 MG PO DR TAB: 250.0000 mg | DELAYED_RELEASE_TABLET | Freq: Every day | ORAL | 2 refills | Status: DC

## 2018-01-04 ENCOUNTER — Encounter: Payer: Self-pay | Admitting: Internal Medicine

## 2018-02-04 ENCOUNTER — Ambulatory Visit (HOSPITAL_COMMUNITY): Payer: Self-pay | Admitting: Psychiatry

## 2018-02-09 ENCOUNTER — Encounter (HOSPITAL_COMMUNITY): Payer: Self-pay | Admitting: Psychiatry

## 2018-02-09 ENCOUNTER — Ambulatory Visit (INDEPENDENT_AMBULATORY_CARE_PROVIDER_SITE_OTHER): Payer: BLUE CROSS/BLUE SHIELD | Admitting: Psychiatry

## 2018-02-09 DIAGNOSIS — F3130 Bipolar disorder, current episode depressed, mild or moderate severity, unspecified: Secondary | ICD-10-CM | POA: Diagnosis not present

## 2018-02-09 DIAGNOSIS — F17210 Nicotine dependence, cigarettes, uncomplicated: Secondary | ICD-10-CM | POA: Diagnosis not present

## 2018-02-09 DIAGNOSIS — F411 Generalized anxiety disorder: Secondary | ICD-10-CM | POA: Diagnosis not present

## 2018-02-09 DIAGNOSIS — Z915 Personal history of self-harm: Secondary | ICD-10-CM | POA: Diagnosis not present

## 2018-02-09 MED ORDER — DIVALPROEX SODIUM 250 MG PO DR TAB: 250.0000 mg | DELAYED_RELEASE_TABLET | Freq: Every day | ORAL | 2 refills | Status: DC

## 2018-02-09 MED ORDER — ARIPIPRAZOLE 10 MG PO TABS: 10.0000 mg | ORAL_TABLET | Freq: Every day | ORAL | 2 refills | Status: DC

## 2018-02-09 MED ORDER — DULOXETINE HCL 60 MG PO CPEP: 60.0000 mg | ORAL_CAPSULE | Freq: Every day | ORAL | 2 refills | Status: DC
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
## 2018-02-10 ENCOUNTER — Ambulatory Visit (INDEPENDENT_AMBULATORY_CARE_PROVIDER_SITE_OTHER): Payer: BLUE CROSS/BLUE SHIELD | Admitting: Psychiatry

## 2018-02-10 DIAGNOSIS — F3130 Bipolar disorder, current episode depressed, mild or moderate severity, unspecified: Secondary | ICD-10-CM | POA: Diagnosis not present

## 2018-02-10 NOTE — Progress Notes (Signed)
Comprehensive Clinical Assessment (CCA) Note  02/10/2018 Janet Hester [id number]  Visit Diagnosis:      ICD-10-CM   1. Bipolar I disorder, most recent episode depressed ([place]) F31.30       CCA Part One  Part One has been completed on paper by the patient.  (See scanned document in Chart Review)  CCA Part Two A  Intake/Chief Complaint:  CCA Intake With Chief Complaint CCA Part Two Date: 02/10/18 Chief Complaint/Presenting Problem: depression Patients Currently Reported Symptoms/Problems: crying, sadnes, "moping", "closed off", doesnt want to talk to anyone, not accept phone call, refuses to answer the door, irritability, panic attacks Collateral Involvement: none Individual's Preferences: individual therapy  Mental Health Symptoms Depression:  Depression: Irritability, Tearfulness, Sleep (too much or little), Difficulty Concentrating, Change in energy/activity, Fatigue, Increase/decrease in appetite(wakes up every two hours; not much appetite)  Mania:  Mania: Euphoria  Anxiety:   Anxiety: Difficulty concentrating, Fatigue, Irritability, Restlessness, Sleep, Tension  Psychosis:  Psychosis: N/A  Trauma:  Trauma: N/A  Obsessions:  Obsessions: N/A  Compulsions:  Compulsions: N/A  Inattention:  Inattention: N/A  Hyperactivity/Impulsivity:  Hyperactivity/Impulsivity: N/A  Oppositional/Defiant Behaviors:  Oppositional/Defiant Behaviors: N/A  Borderline Personality:  Emotional Irregularity: N/A  Other Mood/Personality Symptoms:      Mental Status Exam Appearance and self-care  Stature:  Stature: Tall  Weight:  Weight: Overweight  Clothing:  Clothing: Casual  Grooming:  Grooming: Normal  Cosmetic use:  Cosmetic Use: Age appropriate  Posture/gait:  Posture/Gait: Normal  Motor activity:  Motor Activity: Slowed  Sensorium  Attention:  Attention: Normal  Concentration:  Concentration: Anxiety interferes  Orientation:  Orientation: X5  Recall/memory:  Recall/Memory: Normal  Affect  and Mood  Affect:     Mood:     Relating  Eye contact:  Eye Contact: Normal  Facial expression:  Facial Expression: Depressed  Attitude toward examiner:  Attitude Toward Examiner: Cooperative  Thought and Language  Speech flow: Speech Flow: Normal  Thought content:  Thought Content: Appropriate to mood and circumstances  Preoccupation:     Hallucinations:     Organization:     Transport planner of Knowledge:  Fund of Knowledge: Average  Intelligence:  Intelligence: Average  Abstraction:  Abstraction: Normal  Judgement:  Judgement: Normal  Reality Testing:  Reality Testing: Adequate  Insight:  Insight: Good  Decision Making:  Decision Making: Normal  Social Functioning  Social Maturity:  Social Maturity: Isolates  Social Judgement:  Social Judgement: Normal  Stress  Stressors:  Stressors: Family conflict, Chiropodist, Transitions  Coping Ability:  Coping Ability: English as a second language teacher Deficits:     Supports:      Family and Psychosocial History: Family history Marital status: Divorced Are you sexually active?: Yes What is your sexual orientation?: heterosexual Has your sexual activity been affected by drugs, alcohol, medication, or emotional stress?: no Does patient have children?: Yes How many children?: 3 How is patient's relationship with their children?: relationships with 3 daughters is good  Childhood History:  Childhood History By whom was/is the patient raised?: Grandparents Additional childhood history information: raised by grandmother Description of patient's relationship with caregiver when they were a child: very good relationship with her grandmother; no relationship with father until early 21s; relationship with mother was always "turmoil" Patient's description of current relationship with people who raised him/her: grandmother died when Pt. was 85 years old How were you disciplined when you got in trouble as a child/adolescent?: appropriate Does patient  have siblings?: Yes Number of Siblings: 2  Description of patient's current relationship with siblings: older sister passed; younger sister is close to her Did patient suffer any verbal/emotional/physical/sexual abuse as a child?: No Did patient suffer from severe childhood neglect?: No Has patient ever been sexually abused/assaulted/raped as an adolescent or adult?: No Was the patient ever a victim of a crime or a disaster?: No Witnessed domestic violence?: No Has patient been effected by domestic violence as an adult?: No  CCA Part Two B  Employment/Work Situation: Employment / Work Copywriter, advertising Employment situation: Product manager job has been impacted by current illness: No What is the longest time patient has a held a job?: 12 years  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 Has patient ever been in the [employer]?: No Has patient ever served in combat?: No Did You Receive Any Psychiatric Treatment/Services While in Passenger transport manager?: No Are There Guns or Other Weapons in [place]?: No Are These Psychologist, educational?: Yes  Education: Education Last Grade Completed: 51 Did You Nutritional therapist?: Yes What Type of College Degree Do you Have?: BS Did You Attend Graduate School?: No What Was Your Major?: accounting Did You Have An Individualized Education Program (IIEP): No Did You Have Any Difficulty At School?: No  Religion: Religion/Spirituality Are You A Religious Person?: Yes What is Your Religious Affiliation?: International aid/development worker: Leisure / Recreation Leisure and Hobbies: spend time with grandchildren; travel; went to [place] with her daughter and grandchildren last week  Exercise/Diet: Exercise/Diet Do You Exercise?: No Have You Gained or Lost A Significant Amount of Weight in the Past Six Months?: Yes-Gained Number of Pounds Gained: 15 Do You Follow a Special Diet?: No Do You Have Any Trouble Sleeping?: Yes Explanation of Sleeping  Difficulties: wakes up every two hours throughout the night  CCA Part Two C  Alcohol/Drug Use: Alcohol / Drug Use Pain Medications: hydrocodeine because of tooth pain Over the Counter: ibuprofen History of alcohol / drug use?: No history of alcohol / drug abuse                      CCA Part Three  ASAM's:  Six Dimensions of Multidimensional Assessment  Dimension 1:  Acute Intoxication and/or Withdrawal Potential:     Dimension 2:  Biomedical Conditions and Complications:     Dimension 3:  Emotional, Behavioral, or Cognitive Conditions and Complications:     Dimension 4:  Readiness to Change:     Dimension 5:  Relapse, Continued use, or Continued Problem Potential:     Dimension 6:  Recovery/Living Environment:      Substance use Disorder (SUD)    Social Function:  Social Functioning Social Maturity: Isolates Social Judgement: Normal  Stress:  Stress Stressors: Family conflict, Money, Transitions Coping Ability: Overwhelmed Patient Takes Medications The Way The Doctor Instructed?: Yes Priority Risk: Moderate Risk  Risk Assessment- Self-Harm Potential: Risk Assessment For Self-Harm Potential Thoughts of Self-Harm: No current thoughts Method: No plan Availability of Means: No access/NA Additional Information for Self-Harm Potential: Previous Attempts Additional Comments for Self-Harm Potential: Pt. has had 4 previous suicide attempts  Risk Assessment -Dangerous to Others Potential: Risk Assessment For Dangerous to Others Potential Method: No Plan Availability of Means: No access or NA Intent: Vague intent or NA Notification Required: No need or identified person  DSM5 Diagnoses: Patient Active Problem List   Diagnosis Date Noted  . 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 Hyperlipidemia with target LDL less than 160  11/14/2015  . Obesity (BMI 35.0-39.9 without comorbidity) 11/14/2015  . Tobacco abuse  11/14/2015  . Lymphedema of lower extremity 03/22/2015  . Primary hypercoagulable state ([place]) 03/15/2015  . Routine general medical examination at a health care facility 03/15/2015  . Visit for screening mammogram 03/15/2015  . Panic anxiety syndrome 03/15/2015  . Herpes labialis 11/23/2013  . Trochanteric bursitis of right hip 10/27/2012  . DVT, lower extremity, distal ([place]) 05/04/2012    Patient Centered Plan: Patient is on the following Treatment Plan(s): Pt. To complete treatment plan with individual therapist  Recommendations for Services/Supports/Treatments: Recommendations for Services/Supports/Treatments Recommendations For Services/Supports/Treatments: Medication Management, IOP (Intensive Outpatient Program), Individual Therapy  Treatment Plan Summary: Pt. Referred by Dr. Adele Schilder due to bipolar diagnosis, history of severe depression and anxiety. Pt. Reports that most current stressor of boyfriend who is alcohol dependent.    Referrals to Alternative Service(s): Referred to Alternative Service(s):   Place:   Date:   Time:    Referred to Alternative Service(s):   Place:   Date:   Time:    Referred to Alternative Service(s):   Place:   Date:   Time:    Referred to Alternative Service(s):   Place:   Date:   Time:     Nancie Neas

## 2018-02-24 ENCOUNTER — Ambulatory Visit (HOSPITAL_COMMUNITY): Payer: Self-pay | Admitting: Psychiatry

## 2018-03-02 ENCOUNTER — Emergency Department (HOSPITAL_COMMUNITY): Payer: No Typology Code available for payment source

## 2018-03-02 ENCOUNTER — Encounter (HOSPITAL_COMMUNITY): Payer: Self-pay | Admitting: Emergency Medicine

## 2018-03-02 ENCOUNTER — Emergency Department (HOSPITAL_COMMUNITY)
Admission: EM | Admit: 2018-03-02 | Discharge: 2018-03-02 | Disposition: A | Discharge status: Home / Self Care | Payer: No Typology Code available for payment source | Attending: Emergency Medicine | Admitting: Emergency Medicine

## 2018-03-02 DIAGNOSIS — R0789 Other chest pain: Secondary | ICD-10-CM | POA: Insufficient documentation

## 2018-03-02 DIAGNOSIS — Y999 Unspecified external cause status: Secondary | ICD-10-CM | POA: Diagnosis not present

## 2018-03-02 DIAGNOSIS — Z7901 Long term (current) use of anticoagulants: Secondary | ICD-10-CM | POA: Insufficient documentation

## 2018-03-02 DIAGNOSIS — F17210 Nicotine dependence, cigarettes, uncomplicated: Secondary | ICD-10-CM | POA: Insufficient documentation

## 2018-03-02 DIAGNOSIS — M25562 Pain in left knee: Secondary | ICD-10-CM | POA: Diagnosis not present

## 2018-03-02 DIAGNOSIS — Y92410 Unspecified street and highway as the place of occurrence of the external cause: Secondary | ICD-10-CM | POA: Insufficient documentation

## 2018-03-02 DIAGNOSIS — M25561 Pain in right knee: Secondary | ICD-10-CM | POA: Diagnosis not present

## 2018-03-02 DIAGNOSIS — Y9389 Activity, other specified: Secondary | ICD-10-CM | POA: Insufficient documentation

## 2018-03-02 DIAGNOSIS — S161XXA Strain of muscle, fascia and tendon at neck level, initial encounter: Secondary | ICD-10-CM | POA: Diagnosis not present

## 2018-03-02 DIAGNOSIS — Z79899 Other long term (current) drug therapy: Secondary | ICD-10-CM | POA: Diagnosis not present

## 2018-03-02 DIAGNOSIS — M79621 Pain in right upper arm: Secondary | ICD-10-CM | POA: Insufficient documentation

## 2018-03-02 DIAGNOSIS — S199XXA Unspecified injury of neck, initial encounter: Secondary | ICD-10-CM | POA: Diagnosis present

## 2018-03-02 DIAGNOSIS — M25519 Pain in unspecified shoulder: Secondary | ICD-10-CM

## 2018-03-02 LAB — COMPREHENSIVE METABOLIC PANEL
ALK PHOS: 71 U/L (ref 38–126)
ALT: 17 U/L (ref 14–54)
AST: 20 U/L (ref 15–41)
Albumin: 3.8 g/dL (ref 3.5–5.0)
Anion gap: 9 (ref 5–15)
BUN: 11 mg/dL (ref 6–20)
CALCIUM: 9.4 mg/dL (ref 8.9–10.3)
CO2: 25 mmol/L (ref 22–32)
CREATININE: 0.76 mg/dL (ref 0.44–1.00)
Chloride: 108 mmol/L (ref 101–111)
Glucose, Bld: 93 mg/dL (ref 65–99)
Potassium: 4.1 mmol/L (ref 3.5–5.1)
Sodium: 142 mmol/L (ref 135–145)
Total Bilirubin: 0.6 mg/dL (ref 0.3–1.2)
Total Protein: 7.6 g/dL (ref 6.5–8.1)

## 2018-03-02 LAB — I-STAT CHEM 8, ED
BUN: 12 mg/dL (ref 6–20)
CHLORIDE: 108 mmol/L (ref 101–111)
CREATININE: 0.7 mg/dL (ref 0.44–1.00)
Calcium, Ion: 1.16 mmol/L (ref 1.15–1.40)
GLUCOSE: 91 mg/dL (ref 65–99)
HCT: 39 % (ref 36.0–46.0)
Hemoglobin: 13.3 g/dL (ref 12.0–15.0)
POTASSIUM: 4 mmol/L (ref 3.5–5.1)
Sodium: 142 mmol/L (ref 135–145)
TCO2: 23 mmol/L (ref 22–32)

## 2018-03-02 LAB — CBC
HCT: 39.7 % (ref 36.0–46.0)
Hemoglobin: 13.4 g/dL (ref 12.0–15.0)
MCH: 29.6 pg (ref 26.0–34.0)
MCHC: 33.8 g/dL (ref 30.0–36.0)
MCV: 87.8 fL (ref 78.0–100.0)
PLATELETS: 317 10*3/uL (ref 150–400)
RBC: 4.52 MIL/uL (ref 3.87–5.11)
RDW: 13.7 % (ref 11.5–15.5)
WBC: 6.8 10*3/uL (ref 4.0–10.5)

## 2018-03-02 LAB — ETHANOL

## 2018-03-02 LAB — PROTIME-INR
INR: 1.1
PROTHROMBIN TIME: 14.1 s (ref 11.4–15.2)

## 2018-03-02 LAB — SAMPLE TO BLOOD BANK

## 2018-03-02 LAB — I-STAT CG4 LACTIC ACID, ED: LACTIC ACID, VENOUS: 1.48 mmol/L (ref 0.5–1.9)

## 2018-03-02 LAB — I-STAT BETA HCG BLOOD, ED (MC, WL, AP ONLY): I-stat hCG, quantitative: <5 m[IU]/mL (ref <5)

## 2018-03-02 MED ORDER — IBUPROFEN 600 MG PO TABS: 600.0000 mg | ORAL_TABLET | Freq: Four times a day (QID) | ORAL | 0 refills | Status: DC | PRN

## 2018-03-02 MED ORDER — IOPAMIDOL (ISOVUE-M 300) INJECTION 61%: 15.0000 mL | Freq: Once | INTRAMUSCULAR | Status: DC | PRN

## 2018-03-02 MED ORDER — IOHEXOL 300 MG/ML  SOLN
100.0000 mL | Freq: Once | INTRAMUSCULAR | Status: AC | PRN
  Administered 2018-03-02: 100 mL via INTRAVENOUS

## 2018-03-02 MED ORDER — KETOROLAC TROMETHAMINE 15 MG/ML IJ SOLN
15.0000 mg | Freq: Once | INTRAMUSCULAR | Status: AC
  Administered 2018-03-02: 15 mg via INTRAVENOUS
  Filled 2018-03-02: qty 1

## 2018-03-02 MED ORDER — FENTANYL CITRATE (PF) 100 MCG/2ML IJ SOLN
50.0000 ug | Freq: Once | INTRAMUSCULAR | Status: AC
  Administered 2018-03-02: 50 ug via INTRAVENOUS
  Filled 2018-03-02 (×2): qty 2

## 2018-03-02 MED ORDER — METHOCARBAMOL 500 MG PO TABS: 500.0000 mg | ORAL_TABLET | Freq: Two times a day (BID) | ORAL | 0 refills | Status: DC

## 2018-03-02 NOTE — ED Notes (Signed)
Patient given discharge instructions and verbalized understanding.  Patient stable to discharge at this time.  Patient is alert and oriented to baseline.  No distressed noted at this time.  All belongings taken with the patient at discharge.   
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
## 2018-03-03 ENCOUNTER — Ambulatory Visit (HOSPITAL_COMMUNITY): Payer: Medicaid Other | Admitting: Psychiatry

## 2018-03-03 ENCOUNTER — Other Ambulatory Visit (HOSPITAL_COMMUNITY): Payer: Self-pay | Admitting: Psychiatry

## 2018-03-03 DIAGNOSIS — F411 Generalized anxiety disorder: Secondary | ICD-10-CM

## 2018-03-05 LAB — CDS SEROLOGY

## 2018-03-09 ENCOUNTER — Other Ambulatory Visit (HOSPITAL_COMMUNITY): Payer: Self-pay

## 2018-03-09 DIAGNOSIS — F411 Generalized anxiety disorder: Secondary | ICD-10-CM

## 2018-03-09 MED ORDER — HYDROXYZINE HCL 25 MG PO TABS: 25.0000 mg | ORAL_TABLET | Freq: Every day | ORAL | 0 refills | Status: DC | PRN

## 2018-03-09 NOTE — Progress Notes (Signed)
Sent in 30 day refill, per [facility]. Appointment is 05-11-18

## 2018-03-23 ENCOUNTER — Ambulatory Visit (INDEPENDENT_AMBULATORY_CARE_PROVIDER_SITE_OTHER): Payer: BLUE CROSS/BLUE SHIELD | Admitting: Psychiatry

## 2018-03-23 DIAGNOSIS — F411 Generalized anxiety disorder: Secondary | ICD-10-CM

## 2018-03-23 DIAGNOSIS — F3130 Bipolar disorder, current episode depressed, mild or moderate severity, unspecified: Secondary | ICD-10-CM
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
## 2018-04-13 ENCOUNTER — Ambulatory Visit (HOSPITAL_COMMUNITY): Payer: Self-pay | Admitting: Psychiatry

## 2018-04-19 ENCOUNTER — Encounter: Payer: Self-pay | Admitting: Pharmacist

## 2018-04-19 DIAGNOSIS — Z7901 Long term (current) use of anticoagulants: Secondary | ICD-10-CM

## 2018-04-19 NOTE — Progress Notes (Signed)
Patient had been enrolled in a research trial ([employer] approved) with signed informed consent--focus was upon laboratory monitoring. Study has been completed.

## 2018-05-11 ENCOUNTER — Ambulatory Visit (HOSPITAL_COMMUNITY): Payer: BLUE CROSS/BLUE SHIELD | Admitting: Psychiatry

## 2018-05-11 ENCOUNTER — Other Ambulatory Visit (HOSPITAL_COMMUNITY): Payer: Self-pay

## 2018-05-11 DIAGNOSIS — F3130 Bipolar disorder, current episode depressed, mild or moderate severity, unspecified: Secondary | ICD-10-CM

## 2018-05-11 DIAGNOSIS — F411 Generalized anxiety disorder: Secondary | ICD-10-CM

## 2018-05-11 MED ORDER — DULOXETINE HCL 60 MG PO CPEP: 60.0000 mg | ORAL_CAPSULE | Freq: Every day | ORAL | 0 refills | Status: DC

## 2018-05-11 MED ORDER — ARIPIPRAZOLE 10 MG PO TABS: 10.0000 mg | ORAL_TABLET | Freq: Every day | ORAL | 0 refills | Status: DC

## 2018-05-11 MED ORDER — DIVALPROEX SODIUM 250 MG PO DR TAB: 250.0000 mg | DELAYED_RELEASE_TABLET | Freq: Every day | ORAL | 0 refills | Status: DC

## 2018-05-11 MED ORDER — HYDROXYZINE HCL 25 MG PO TABS: 25.0000 mg | ORAL_TABLET | Freq: Every day | ORAL | 0 refills | Status: DC | PRN

## 2018-05-12 ENCOUNTER — Ambulatory Visit (HOSPITAL_COMMUNITY): Payer: BLUE CROSS/BLUE SHIELD | Admitting: Psychiatry

## 2018-05-26 ENCOUNTER — Ambulatory Visit (INDEPENDENT_AMBULATORY_CARE_PROVIDER_SITE_OTHER): Payer: BLUE CROSS/BLUE SHIELD | Admitting: Psychiatry

## 2018-05-26 DIAGNOSIS — F332 Major depressive disorder, recurrent severe without psychotic features: Secondary | ICD-10-CM | POA: Diagnosis not present

## 2018-05-29 ENCOUNTER — Other Ambulatory Visit: Payer: Self-pay

## 2018-05-29 ENCOUNTER — Encounter (HOSPITAL_COMMUNITY): Payer: Self-pay | Admitting: Psychiatry

## 2018-05-29 ENCOUNTER — Ambulatory Visit (INDEPENDENT_AMBULATORY_CARE_PROVIDER_SITE_OTHER): Payer: BLUE CROSS/BLUE SHIELD | Admitting: Psychiatry

## 2018-05-29 VITALS — BP 136/84 | HR 79 | Temp 98.0°F | Wt 292.0 lb

## 2018-05-29 DIAGNOSIS — F332 Major depressive disorder, recurrent severe without psychotic features: Secondary | ICD-10-CM

## 2018-05-29 DIAGNOSIS — F3130 Bipolar disorder, current episode depressed, mild or moderate severity, unspecified: Secondary | ICD-10-CM | POA: Diagnosis not present

## 2018-05-29 DIAGNOSIS — F411 Generalized anxiety disorder: Secondary | ICD-10-CM | POA: Diagnosis not present

## 2018-05-29 MED ORDER — TOPIRAMATE 50 MG PO TABS: ORAL_TABLET | ORAL | 0 refills | Status: DC

## 2018-05-29 MED ORDER — DULOXETINE HCL 60 MG PO CPEP: 120.0000 mg | ORAL_CAPSULE | ORAL | 0 refills | Status: DC

## 2018-05-29 MED ORDER — HYDROXYZINE HCL 25 MG PO TABS: 25.0000 mg | ORAL_TABLET | Freq: Every day | ORAL | 0 refills | Status: DC | PRN

## 2018-05-29 MED ORDER — ARIPIPRAZOLE 10 MG PO TABS: 10.0000 mg | ORAL_TABLET | ORAL | 0 refills | Status: DC

## 2018-05-29 NOTE — Progress Notes (Signed)
BH MD/PA/NP OP Progress Note  05/29/2018 10:22 AM Janet Hester  MRN:  [id number]  Chief Complaint: med check  Chief Complaint    Follow-up; Medication Refill; Medication Problem; Weight Gain     HPI: Janet Hester reports that Depakote has been helpful but it has been associated with about 15-17 pounds of weight gain.  We agreed to discontinue and switch her to Topamax, and I reviewed the risks and benefits of Topamax including sedation, dry mouth, risk of SJS.  She reports that Cymbalta has been really helpful as well and we agreed to increase further to target ongoing anxiety and depressive symptoms.  She has no acute safety concerns and otherwise we will follow-up with Dr. Adele Schilder.  I reviewed her medications with her and made sure that she knows to take Cymbalta and Abilify in the morning (she was taking them at night) and she should take Topamax in the evening.  We agreed to titrate Topamax to 100 mg dose to assist with mood stability and weight reduction.   Visit Diagnosis:    ICD-10-CM   1. MDD (major depressive disorder), recurrent severe, without psychosis ([place]) F33.2   2. Bipolar I disorder, most recent episode depressed (HCC) F31.30 DULoxetine (CYMBALTA) 60 MG capsule    ARIPiprazole (ABILIFY) 10 MG tablet    topiramate (TOPAMAX) 50 MG tablet  3. Generalized anxiety disorder F41.1 DULoxetine (CYMBALTA) 60 MG capsule    hydrOXYzine (ATARAX/VISTARIL) 25 MG tablet    topiramate (TOPAMAX) 50 MG tablet    Past Psychiatric History: See intake H&P for full details. Reviewed, with no updates at this time.   Past Medical History:  Past Medical History:  Diagnosis Date  . Arthritis   . Bursitis   . Bursitis   . Depression   . DVT, lower extremity, distal ([place]) 05/04/2012   DVT in a branch off of LEFT prox popliteal vein, mid posterior tibial vein, and upper calf of the peroneal vein.   . Shingles   . Thrombus     Past Surgical History:  Procedure Laterality Date  . APPENDECTOMY     . LASER ABLATION      Family Psychiatric History: See intake H&P for full details. Reviewed, with no updates at this time.   Family History:  Family History  Problem Relation Age of Onset  . Pulmonary embolism Mother   . Cancer Mother 52       cervical cancer  . Deep vein thrombosis Father   . Deep vein thrombosis Paternal Aunt   . Deep vein thrombosis Paternal Uncle   . Deep vein thrombosis Daughter   . Pulmonary embolism Daughter   . Deep vein thrombosis Daughter   . Colon cancer Neg Hx     Social History:  Social History   Socioeconomic History  . Marital status: Single    Spouse name: Not on file  . Number of children: 3  . Years of education: Not on file  . 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 Financial resource strain: Somewhat hard  . Food insecurity:    Worry: Often true    Inability: Often true  . Transportation needs:    Medical: No    Non-medical: No  Tobacco Use  . Smoking status: Current Every Day Smoker    Packs/day: 1.00    Years: 20.00    Pack years: 20.00    Types: Cigarettes  .  Smokeless tobacco: Never Used  Substance and Sexual Activity  . Alcohol use: No    Alcohol/week: 0.0 standard drinks  . Drug use: No  . Sexual activity: Not Currently  Lifestyle  . Physical activity:    Days per week: 0 days    Minutes per session: 0 min  . Stress: Only a little  Relationships  . Social connections:    Talks on phone: Not on file    Gets together: Not on file    Attends religious service: Never    Active member of club or organization: No    Attends meetings of clubs or organizations: Never    Relationship status: Divorced  Other Topics Concern  . Not on file  Social History Narrative  . Not on file    Allergies:  Allergies  Allergen Reactions  . Shellfish-Derived Products Anaphylaxis, Hives and Swelling    Metabolic Disorder Labs: Lab  Results  Component Value Date   HGBA1C 5.0 07/24/2017   No results found for: PROLACTIN Lab Results  Component Value Date   CHOL 212 (H) 11/14/2015   TRIG 125.0 11/14/2015   HDL 39.90 11/14/2015   CHOLHDL 5 11/14/2015   VLDL 25.0 11/14/2015   LDLCALC 147 (H) 11/14/2015   LDLCALC 150 (H) 03/15/2015   Lab Results  Component Value Date   TSH 1.000 07/24/2017   TSH 1.08 03/15/2015    Therapeutic Level Labs: No results found for: LITHIUM No results found for: VALPROATE No components found for:  CBMZ  Current Medications: Current Outpatient Medications  Medication Sig Dispense Refill  . ARIPiprazole (ABILIFY) 10 MG tablet Take 1 tablet (10 mg total) by mouth every morning. 90 tablet 0  . calcium carbonate (TUMS EX) 750 MG chewable tablet Chew 2 tablets by mouth as needed for heartburn.    . cetirizine (ZYRTEC) 10 MG tablet Take 10 mg by mouth daily as needed for allergies.    . DULoxetine (CYMBALTA) 60 MG capsule Take 2 capsules (120 mg total) by mouth every morning. 180 capsule 0  . EPINEPHrine (EPIPEN) 0.3 mg/0.3 mL IJ SOAJ injection Inject 0.3 mLs (0.3 mg total) into the muscle once as needed. 1 Device 1  . fluticasone (FLONASE SENSIMIST) 27.5 MCG/SPRAY nasal spray Place 1 spray into the nose daily as needed for allergies.    Marland Kitchen gabapentin (NEURONTIN) 300 MG capsule Take 1 capsule (300 mg total) by mouth 3 (three) times daily. 90 capsule 0  . hydrOXYzine (ATARAX/VISTARIL) 25 MG tablet Take 1 tablet (25 mg total) by mouth daily as needed for anxiety. 90 tablet 0  . ibuprofen (ADVIL,MOTRIN) 600 MG tablet Take 1 tablet (600 mg total) by mouth every 6 (six) hours as needed. 30 tablet 0  . methocarbamol (ROBAXIN) 500 MG tablet Take 1 tablet (500 mg total) by mouth 2 (two) times daily. 20 tablet 0  . oxyCODONE-acetaminophen (PERCOCET) 10-325 MG tablet Take 1 tablet by mouth every 6 (six) hours as needed for pain.  0  . rivaroxaban (XARELTO) 20 MG TABS tablet Take 1 tablet (20 mg total)  by mouth daily with supper. 30 tablet 0  . topiramate (TOPAMAX) 50 MG tablet 1/2 tablet nightly for 3 nights, then full tablet for 3 nights, then 2 tablets nightly 369 tablet 0   No current facility-administered medications for this visit.      Musculoskeletal: Strength & Muscle Tone: within normal limits Gait & Station: normal Patient leans: N/A  Psychiatric Specialty Exam: ROS  Blood pressure 136/84, pulse 79,  temperature 98 F (36.7 C), temperature source Oral, weight 292 lb (132.5 kg).Body mass index is 39.6 kg/m.  General Appearance: Casual and Fairly Groomed  Eye Contact:  Fair  Speech:  Clear and Coherent and Normal Rate  Volume:  Normal  Mood:  Euthymic  Affect:  Appropriate and Congruent  Thought Process:  Goal Directed and Descriptions of Associations: Intact  Orientation:  Full (Time, Place, and Person)  Thought Content: Logical   Suicidal Thoughts:  No  Homicidal Thoughts:  No  Memory:  Immediate;   Fair  Judgement:  Good  Insight:  Fair  Psychomotor Activity:  Normal  Concentration:  Concentration: Fair  Recall:  [employer] of Knowledge: Fair  Language: Fair  Akathisia:  Negative  Handed:  Right  AIMS (if indicated): not done  Assets:  Communication Skills Desire for Improvement Financial Resources/Insurance Housing  ADL's:  Intact  Cognition: WNL  Sleep:  Fair   Screenings: AIMS     Admission (Discharged) from OP Visit from 04/16/2017 in [facility]  AIMS Total Score  0    AUDIT     Admission (Discharged) from OP Visit from 04/16/2017 in [facility]  Alcohol Use Disorder Identification Test Final Score (AUDIT)  0      Assessment and Plan: Quintasia Theroux presents for cross coverage follow up.  She has had benefit from Depakote, but has had associated weight gain.  We agreed to increase Cymbalta to the full therapeutic dose and switch her from Depakote to Topamax for some mood  stabilizing effect.  Anticipate this will also help her with weight reduction.  No acute safety concerns and we will follow-up in 3 months with Dr. Adele Schilder.  1. MDD (major depressive disorder), recurrent severe, without psychosis ([place])   2. Bipolar I disorder, most recent episode depressed ([place])   3. Generalized anxiety disorder     Status of current problems: stable  Labs Ordered: No orders of the defined types were placed in this encounter.   Labs Reviewed: n/a  Collateral Obtained/Records Reviewed: Dr Adele Schilder notes  Plan:  Continue Cymbalta increased to 120 mg Discontinue Depakote Initiate Topamax 25 mg nightly for 3 nights, then 50 mg nightly for 3 nights, then 100 mg nightly Continue Abilify 10 mg daily Continue hydroxyzine 25 mg 1-3 times a day for anxiety  Aundra Dubin, MD 05/29/2018, 10:22 AM

## 2018-05-31 NOTE — Progress Notes (Signed)
   THERAPIST PROGRESS NOTE   Session Time: 2:40-3:30  Participation Level: Active  Behavioral Response: CasualAlertEuthymic  Type of Therapy: Individual Therapy  Treatment Goals addressed: Coping  Interventions: Supportive  Summary: Patience Nuzzo is a 52 y.o. female who presents with Anxiety and Depression.   Suicidal/Homicidal: Nowithout intent/plan  Therapist Response: Pt. Presents for first session since June; Pt. Continues to present as talkative, engaged in therapy process. Pt. Reports that she ended her relationship of 8 years and has relocated and currently living with her daughter. Pt. Discussed that her living environment is safe and comfortable and she is happy there. Pt. Discussed that since the last session she went on a cruise with her daughter and daughter's husband and had a great time. Pt. Discussed that her sister came for her birthday this week and that her sister's personality and presence have been a significant source of stress and that she is ready for her sister to go home. Pt . Discussed that her sister and her son shower four times a day and cook three times a day and that organizing her day around their schedule was very difficult and stressful for her. Counselor provided some psychoeducation about the distinction between situational depression and chemical depression. Pt. Indicating understanding the importance of coping with short-term situational stressors. Pt. Agreed that her current mood with some mild anxiety would be much better when her sister goes back home.   Plan: Return again in 4 weeks.  Diagnosis: GAD and MDD  Nancie Neas, [facility] 05/31/2018

## 2018-06-23 ENCOUNTER — Encounter (HOSPITAL_COMMUNITY): Payer: Self-pay | Admitting: Psychiatry

## 2018-06-29 ENCOUNTER — Ambulatory Visit (HOSPITAL_COMMUNITY): Payer: BLUE CROSS/BLUE SHIELD | Admitting: Psychiatry

## 2018-07-22 ENCOUNTER — Other Ambulatory Visit: Payer: Self-pay

## 2018-07-22 ENCOUNTER — Encounter (HOSPITAL_COMMUNITY): Payer: Self-pay | Admitting: *Deleted

## 2018-07-22 ENCOUNTER — Telehealth (HOSPITAL_COMMUNITY): Payer: Self-pay

## 2018-07-22 ENCOUNTER — Inpatient Hospital Stay (HOSPITAL_COMMUNITY)
Admission: AD | Admit: 2018-07-22 | Discharge: 2018-07-26 | DRG: 885 | Disposition: A | Discharge status: Home / Self Care | Payer: BLUE CROSS/BLUE SHIELD | Attending: Psychiatry | Admitting: Psychiatry

## 2018-07-22 DIAGNOSIS — Z8619 Personal history of other infectious and parasitic diseases: Secondary | ICD-10-CM

## 2018-07-22 DIAGNOSIS — Z91013 Allergy to seafood: Secondary | ICD-10-CM | POA: Diagnosis not present

## 2018-07-22 DIAGNOSIS — M549 Dorsalgia, unspecified: Secondary | ICD-10-CM | POA: Diagnosis present

## 2018-07-22 DIAGNOSIS — Z915 Personal history of self-harm: Secondary | ICD-10-CM

## 2018-07-22 DIAGNOSIS — F419 Anxiety disorder, unspecified: Secondary | ICD-10-CM | POA: Diagnosis present

## 2018-07-22 DIAGNOSIS — Z8049 Family history of malignant neoplasm of other genital organs: Secondary | ICD-10-CM

## 2018-07-22 DIAGNOSIS — Z79899 Other long term (current) drug therapy: Secondary | ICD-10-CM

## 2018-07-22 DIAGNOSIS — Z86718 Personal history of other venous thrombosis and embolism: Secondary | ICD-10-CM

## 2018-07-22 DIAGNOSIS — F314 Bipolar disorder, current episode depressed, severe, without psychotic features: Secondary | ICD-10-CM | POA: Diagnosis not present

## 2018-07-22 DIAGNOSIS — G4700 Insomnia, unspecified: Secondary | ICD-10-CM | POA: Diagnosis present

## 2018-07-22 DIAGNOSIS — M1990 Unspecified osteoarthritis, unspecified site: Secondary | ICD-10-CM | POA: Diagnosis present

## 2018-07-22 DIAGNOSIS — F17210 Nicotine dependence, cigarettes, uncomplicated: Secondary | ICD-10-CM | POA: Diagnosis present

## 2018-07-22 DIAGNOSIS — R45851 Suicidal ideations: Secondary | ICD-10-CM | POA: Diagnosis present

## 2018-07-22 DIAGNOSIS — Z9049 Acquired absence of other specified parts of digestive tract: Secondary | ICD-10-CM

## 2018-07-22 DIAGNOSIS — Z818 Family history of other mental and behavioral disorders: Secondary | ICD-10-CM

## 2018-07-22 DIAGNOSIS — F332 Major depressive disorder, recurrent severe without psychotic features: Secondary | ICD-10-CM | POA: Diagnosis present

## 2018-07-22 DIAGNOSIS — Z7901 Long term (current) use of anticoagulants: Secondary | ICD-10-CM | POA: Diagnosis not present

## 2018-07-22 DIAGNOSIS — Z8249 Family history of ischemic heart disease and other diseases of the circulatory system: Secondary | ICD-10-CM

## 2018-07-22 DIAGNOSIS — F3130 Bipolar disorder, current episode depressed, mild or moderate severity, unspecified: Secondary | ICD-10-CM

## 2018-07-22 LAB — COMPREHENSIVE METABOLIC PANEL
ALK PHOS: 57 U/L (ref 38–126)
ALT: 13 U/L (ref 0–44)
ANION GAP: 10 (ref 5–15)
AST: 19 U/L (ref 15–41)
Albumin: 4.2 g/dL (ref 3.5–5.0)
BILIRUBIN TOTAL: 1 mg/dL (ref 0.3–1.2)
BUN: 8 mg/dL (ref 6–20)
CALCIUM: 9.4 mg/dL (ref 8.9–10.3)
CO2: 22 mmol/L (ref 22–32)
CREATININE: 0.93 mg/dL (ref 0.44–1.00)
Chloride: 111 mmol/L (ref 98–111)
GFR calc Af Amer: >60 mL/min (ref >60)
Glucose, Bld: 118 mg/dL — ABNORMAL HIGH (ref 70–99)
Potassium: 3.6 mmol/L (ref 3.5–5.1)
Sodium: 143 mmol/L (ref 135–145)
TOTAL PROTEIN: 8.1 g/dL (ref 6.5–8.1)

## 2018-07-22 LAB — URINALYSIS, COMPLETE (UACMP) WITH MICROSCOPIC
BILIRUBIN URINE: NEGATIVE
Bacteria, UA: NONE SEEN
Glucose, UA: NEGATIVE mg/dL
HGB URINE DIPSTICK: NEGATIVE
KETONES UR: NEGATIVE mg/dL
LEUKOCYTES UA: NEGATIVE
NITRITE: NEGATIVE
PH: 7 (ref 5.0–8.0)
Protein, ur: NEGATIVE mg/dL
Specific Gravity, Urine: 1.009 (ref 1.005–1.030)

## 2018-07-22 LAB — RAPID URINE DRUG SCREEN, HOSP PERFORMED
AMPHETAMINES: NOT DETECTED
BENZODIAZEPINES: NOT DETECTED
Barbiturates: NOT DETECTED
COCAINE: NOT DETECTED
Opiates: NOT DETECTED
Tetrahydrocannabinol: POSITIVE — AB

## 2018-07-22 LAB — CBC
HCT: 41.1 % (ref 36.0–46.0)
HEMOGLOBIN: 13.3 g/dL (ref 12.0–15.0)
MCH: 29.2 pg (ref 26.0–34.0)
MCHC: 32.4 g/dL (ref 30.0–36.0)
MCV: 90.3 fL (ref 80.0–100.0)
Platelets: 296 10*3/uL (ref 150–400)
RBC: 4.55 MIL/uL (ref 3.87–5.11)
RDW: 13.4 % (ref 11.5–15.5)
WBC: 6.5 10*3/uL (ref 4.0–10.5)
nRBC: 0 % (ref 0.0–0.2)

## 2018-07-22 LAB — TSH: TSH: 0.646 u[IU]/mL (ref 0.350–4.500)

## 2018-07-22 LAB — ETHANOL

## 2018-07-22 MED ORDER — GABAPENTIN 300 MG PO CAPS
300.0000 mg | ORAL_CAPSULE | Freq: Three times a day (TID) | ORAL | Status: DC
  Administered 2018-07-22 – 2018-07-26 (×12): 300 mg via ORAL
  Filled 2018-07-22 (×20): qty 1

## 2018-07-22 MED ORDER — MAGNESIUM HYDROXIDE NICU ORAL SYRINGE 400 MG/5 ML
15.0000 mL | Freq: Every day | ORAL | Status: DC | PRN
  Filled 2018-07-22: qty 15

## 2018-07-22 MED ORDER — ACETAMINOPHEN 325 MG PO TABS
650.0000 mg | ORAL_TABLET | Freq: Four times a day (QID) | ORAL | Status: DC | PRN
  Administered 2018-07-22: 650 mg via ORAL
  Filled 2018-07-22: qty 2

## 2018-07-22 MED ORDER — DULOXETINE HCL 60 MG PO CPEP
120.0000 mg | ORAL_CAPSULE | ORAL | Status: DC
  Filled 2018-07-22: qty 2

## 2018-07-22 MED ORDER — HYDROXYZINE HCL 25 MG PO TABS
25.0000 mg | ORAL_TABLET | Freq: Four times a day (QID) | ORAL | Status: DC | PRN
  Administered 2018-07-23: 25 mg via ORAL
  Filled 2018-07-22: qty 1

## 2018-07-22 MED ORDER — DOXEPIN HCL 25 MG PO CAPS
50.0000 mg | ORAL_CAPSULE | Freq: Every evening | ORAL | Status: DC | PRN
  Administered 2018-07-22: 50 mg via ORAL
  Filled 2018-07-22: qty 2

## 2018-07-22 MED ORDER — LORATADINE 10 MG PO TABS
10.0000 mg | ORAL_TABLET | Freq: Every day | ORAL | Status: DC
  Administered 2018-07-22 – 2018-07-26 (×5): 10 mg via ORAL
  Filled 2018-07-22 (×8): qty 1

## 2018-07-22 MED ORDER — LAMOTRIGINE 25 MG PO TABS
25.0000 mg | ORAL_TABLET | Freq: Every day | ORAL | Status: DC
  Administered 2018-07-22 – 2018-07-26 (×5): 25 mg via ORAL
  Filled 2018-07-22 (×8): qty 1

## 2018-07-22 MED ORDER — ARIPIPRAZOLE 10 MG PO TABS
10.0000 mg | ORAL_TABLET | ORAL | Status: DC
  Administered 2018-07-23 – 2018-07-26 (×4): 10 mg via ORAL
  Filled 2018-07-22 (×6): qty 1

## 2018-07-22 MED ORDER — HYDROXYZINE HCL 25 MG PO TABS: 25.0000 mg | ORAL_TABLET | Freq: Every day | ORAL | Status: DC | PRN

## 2018-07-22 MED ORDER — DULOXETINE HCL 60 MG PO CPEP
60.0000 mg | ORAL_CAPSULE | Freq: Two times a day (BID) | ORAL | Status: DC
  Administered 2018-07-22 – 2018-07-26 (×8): 60 mg via ORAL
  Filled 2018-07-22 (×14): qty 1

## 2018-07-22 MED ORDER — RIVAROXABAN 20 MG PO TABS
20.0000 mg | ORAL_TABLET | Freq: Every day | ORAL | Status: DC
  Administered 2018-07-22 – 2018-07-25 (×4): 20 mg via ORAL
  Filled 2018-07-22 (×7): qty 1

## 2018-07-22 MED ORDER — NICOTINE 21 MG/24HR TD PT24
21.0000 mg | MEDICATED_PATCH | Freq: Every day | TRANSDERMAL | Status: DC
  Administered 2018-07-22 – 2018-07-26 (×5): 21 mg via TRANSDERMAL
  Filled 2018-07-22 (×9): qty 1
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
## 2018-07-22 NOTE — Telephone Encounter (Signed)
You have not seen this patient yet, she is scheduled with you next month. Patient is calling due to medication not working and patient feels like she is falling apart. Patient was advised by this Probation officer to go to [facility]. Please review and advise, thank you

## 2018-07-22 NOTE — [facility] Suicide Risk Assessment (Signed)
[facility] Admission Suicide Risk Assessment   Nursing information obtained from:  Patient Demographic factors:  Living alone, Unemployed Current Mental Status:  Suicidal ideation indicated by patient, Self-harm thoughts Loss Factors:  Decrease in vocational status, Loss of significant relationship Historical Factors:  Prior suicide attempts Risk Reduction Factors:  Living with another person, especially a relative  Total Time spent with patient: 30 minutes Principal Problem: <principal problem not specified> Diagnosis:   Patient Active Problem List   Diagnosis Date Noted  . Bipolar 1 disorder, depressed, severe ([place]) [F31.4] 07/22/2018  . MDD (major depressive disorder), recurrent severe, without psychosis ([place]) [F33.2] 04/16/2017  . Lichen sclerosus [H29.9] 04/29/2016  . Menopausal disorder [N95.9] 04/25/2016  . History of DVT in adulthood [Z86.718] 04/25/2016  . Depression [F32.9] 04/25/2016  . Hyperlipidemia with target LDL less than 160 [E78.5] 11/14/2015  . Obesity (BMI 35.0-39.9 without comorbidity) [E66.9] 11/14/2015  . Tobacco abuse [Z72.0] 11/14/2015  . Lymphedema of lower extremity [I89.0] 03/22/2015  . Primary hypercoagulable state ([place]) [D68.59] 03/15/2015  . Routine general medical examination at a health care facility [Z00.00] 03/15/2015  . Visit for screening mammogram [Z12.31] 03/15/2015  . Panic anxiety syndrome [F41.0] 03/15/2015  . Herpes labialis [B00.1] 11/23/2013  . Trochanteric bursitis of right hip [M70.61] 10/27/2012  . DVT, lower extremity, distal ([place]) [I82.4Z9] 05/04/2012   Subjective Data: Patient is seen and examined.  Patient is a 52 year old female with a reported past psychiatric history significant for major depression versus bipolar disorder; most recently depressed.  The patient's daughter contacted her outpatient psychiatrist today.  They were wishing to be seen because of worsening depressive symptoms.  She was unable to be able to get an appointment  but did speak with the psychiatrist office, and recommended coming in being evaluated.  She was evaluated as a walk-in that the behavioral health hospital.  The patient stated that she has been spiraling downwards for several weeks.  She had previously been treated with Depakote, but that it been stopped secondary to weight gain.  She had been switched to Topamax on 05/29/2018.  She stated that she got dry mouth, and other side effects.  She had worsening depression.  She admitted to helplessness, hopelessness and worthlessness.  She admitted to suicidal ideation.  She also is currently taking Abilify 10 mg p.o. every morning and Cymbalta either 60 mg twice a day or 120 mg once a day.  The patient stated that her last psychiatric hospitalization was at this facility in July 2018.  At that time she had severe depression, suicidal ideation, was feeling sadness, tearful, isolated, withdrawn, irritable and having disturbed sleep.  She had previously been treated for depression by her primary care provider who would previously prescribed Paxil as well as Wellbutrin XL.  Her psychiatric history goes back to age 3, and she is had 2 previous psychiatric admissions besides the current one.  At the end of the July hospitalization she was discharged on Abilify, clonazepam, Cymbalta, gabapentin, hydroxyzine and trazodone.  The patient stated that the trazodone was not working and she had stopped that previously.  She was admitted to the hospital for evaluation and stabilization.  Continued Clinical Symptoms:  Alcohol Use Disorder Identification Test Final Score (AUDIT): 0 The "Alcohol Use Disorders Identification Test", Guidelines for Use in Primary Care, Second Edition.  [facility]). Score between 0-7:  no or low risk or alcohol related problems. Score between 8-15:  moderate risk of alcohol related problems. Score between 16-19:  high risk  of alcohol related problems. Score 20 or above:  warrants  further diagnostic evaluation for alcohol dependence and treatment.   CLINICAL FACTORS:   Bipolar Disorder:   Depressive phase Depression:   Anhedonia Hopelessness Impulsivity Insomnia   Musculoskeletal: Strength & Muscle Tone: within normal limits Gait & Station: normal Patient leans: N/A  Psychiatric Specialty Exam: Physical Exam  Nursing note and vitals reviewed. Constitutional: She is oriented to person, place, and time. She appears well-developed and well-nourished.  HENT:  Head: Normocephalic and atraumatic.  Respiratory: Effort normal.  Neurological: She is alert and oriented to person, place, and time.    ROS  Blood pressure 126/85, pulse 71, temperature 98.3 F (36.8 C), temperature source Oral, resp. rate 18, height 6' (1.829 m), weight 125.6 kg, SpO2 99 %.Body mass index is 37.57 kg/m.  General Appearance: Casual  Eye Contact:  Fair  Speech:  Normal Rate  Volume:  Normal  Mood:  Anxious and Depressed  Affect:  Congruent  Thought Process:  Coherent and Descriptions of Associations: Intact  Orientation:  Full (Time, Place, and Person)  Thought Content:  Logical  Suicidal Thoughts:  Yes.  without intent/plan  Homicidal Thoughts:  No  Memory:  Immediate;   Fair Recent;   Fair Remote;   Fair  Judgement:  Intact  Insight:  Fair  Psychomotor Activity:  Increased  Concentration:  Concentration: Fair and Attention Span: Fair  Recall:  [employer] of Knowledge:  Fair  Language:  Fair  Akathisia:  Negative  Handed:  Right  AIMS (if indicated):     Assets:  Communication Skills Desire for Improvement Financial Resources/Insurance Housing Resilience Social Support  ADL's:  Intact  Cognition:  WNL  Sleep:         COGNITIVE FEATURES THAT CONTRIBUTE TO RISK:  None    SUICIDE RISK:   Moderate:  Frequent suicidal ideation with limited intensity, and duration, some specificity in terms of plans, no associated intent, good self-control, limited  dysphoria/symptomatology, some risk factors present, and identifiable protective factors, including available and accessible social support.  PLAN OF CARE: Patient is seen and examined.  Patient is a 52 year old female with the above-stated past psychiatric history was admitted secondary to worsening depression as well as suicidal ideation.  She also endorsed sleep disturbance, and pressured speech at different times during the last year.  She will be admitted to the psychiatric unit.  She will be integrated into the milieu.  I am going to continue her Cymbalta but change it to 60 mg twice a day instead of 120 mg once a day.  She will also be continued on her Abilify at 10 mg p.o. daily.  The Topamax is already been stopped, and she does not want to go back on Depakote given the weight gain.  We discussed options, and we will try Lamictal.  I will start 25 mg p.o. every afternoon starting tonight.  We will also continue her hydroxyzine.  She has a history of DVT and her Xarelto will be continued.  She stated that the trazodone no longer helped with her sleep, so we will try doxepin and will start at 25 mg p.o. nightly.  Will be encouraged to attend groups.  She will be encouraged to work on her coping skills.  We will obtain admission laboratories and hopefully be able to assist her in recovery.  I certify that inpatient services furnished can reasonably be expected to improve the patient's condition.   Sharma Covert, MD 07/22/2018,  4:22 PM

## 2018-07-22 NOTE — Telephone Encounter (Signed)
Medication management - Patient came in with her daughter, Jacqulyn Bath and admitted to some suicidal ideations, auditory hallucinations and stated she just did not feel her medication was working.  Stated "something is wrong with me".  Discussed with patient concerns for her safety as she agreed she did not think she was in a good place, no plan or intent to harm self at this time but admitted she has been thinking this way some.  Patient agreed with plan to go with her daughter over to [facility] for an evaluation and for potential admission to [facility].  Patient stated she was not against signing  [facility]elf into [facility] as a voluntary admission and her daughter agreed to help patient inform the ED staff of this desire.  Patient's daughter stated feeling patient was fine to go with her to the ED as did not feel patient at this time was a safety risk and would go with her but did agree to call this office back later today to make sure she has no problems and is able to get patient evaluated and into the hospital as she plans.  Collateral or patient to call our office back if any issues and patient left with daughter to go to [facility].  Agreed to let Dr. Adele Schilder know where she was going and patient to be scheduled back with Dr. Adele Schilder upon discharge.

## 2018-07-22 NOTE — Tx Team (Signed)
Initial Treatment Plan 07/22/2018 3:59 PM Sumner Boesch FRT:[id number]    PATIENT STRESSORS: Financial difficulties Medication change or noncompliance Occupational concerns   PATIENT STRENGTHS: Average or above average intelligence Communication skills Motivation for treatment/growth Supportive family/friends   PATIENT IDENTIFIED PROBLEMS: "I want to feel alive again"  "get my head out of this hole"  Suicidal thoughts  Medications were not working.               DISCHARGE CRITERIA:  Improved stabilization in mood, thinking, and/or behavior Motivation to continue treatment in a less acute level of care Need for constant or close observation no longer present Reduction of life-threatening or endangering symptoms to within safe limits  PRELIMINARY DISCHARGE PLAN: Outpatient therapy Return to previous living arrangement  PATIENT/FAMILY INVOLVEMENT: This treatment plan has been presented to and reviewed with the patient.  The patient and family have been given the opportunity to ask questions and make suggestions.  Zipporah Plants, RN 07/22/2018, 3:59 PM

## 2018-07-22 NOTE — [facility] Group Notes (Signed)
Pt attended and participated in wrap up group this evening. Pt was asleep in bed.

## 2018-07-22 NOTE — Telephone Encounter (Signed)
If she is having suicidal thoughts, psychosis or hallucination that she should go to the emergency room.  If she has no above symptoms then she can try Depakote which worked in the past but stopped due to weight gain.  I will discuss other treatment options on her next appointment.

## 2018-07-22 NOTE — H&P (Signed)
Behavioral Health Medical Screening Exam  Janet Hester is an 52 y.o. female patient presents as walk in at [facility]; brought in by her daughter after leaving outpatient psychiatric provider office try to get an earlier appointment but sent to hospital; with complaints of worsening depression, isolation, suicidal ideation with plan to cut her wrist.  Daughter concerned afraid to leave mother at home alone.  Patient unable to contact for safety.    Total Time spent with patient: 30 minutes  Psychiatric Specialty Exam: Physical Exam  Vitals reviewed. Constitutional: She is oriented to person, place, and time. She appears well-nourished.  Neck: Normal range of motion. Neck supple.  Respiratory: Effort normal.  Musculoskeletal: Normal range of motion.  Neurological: She is alert and oriented to person, place, and time.  Skin: Skin is warm and dry.    Review of Systems  Psychiatric/Behavioral: Positive for depression and suicidal ideas. Negative for hallucinations and substance abuse. The patient is nervous/anxious.   All other systems reviewed and are negative.   Blood pressure 121/85, pulse 78, temperature 98.5 F (36.9 C), resp. rate 16, SpO2 100 %.There is no height or weight on file to calculate BMI.  General Appearance: Casual  Eye Contact:  Good  Speech:  Clear and Coherent and Normal Rate  Volume:  Normal  Mood:  Depressed and Hopeless  Affect:  Depressed and Flat  Thought Process:  Coherent and Goal Directed  Orientation:  Full (Time, Place, and Person)  Thought Content:  Denies hallucinaitons, delusions, and paranoia  Suicidal Thoughts:  Yes.  with intent/plan  Homicidal Thoughts:  No  Memory:  Immediate;   Good Recent;   Good Remote;   Good  Judgement:  Fair  Insight:  Fair  Psychomotor Activity:  Decreased  Concentration: Concentration: Fair and Attention Span: Fair  Recall:  Good  Fund of Knowledge:Good  Language: Good  Akathisia:  No  Handed:  Right  AIMS (if  indicated):     Assets:  Communication Skills Desire for Improvement Financial Resources/Insurance Housing Physical Health Social Support  Sleep:       Musculoskeletal: Strength & Muscle Tone: within normal limits Gait & Station: normal Patient leans: N/A  Blood pressure 121/85, pulse 78, temperature 98.5 F (36.9 C), resp. rate 16, SpO2 100 %.  Recommendations:  Inpatient psychiatric treatment  Based on my evaluation the patient does not appear to have an emergency medical condition.  Cai Flott, NP 07/22/2018, 3:26 PM
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
## 2018-07-23 LAB — LIPID PANEL
CHOLESTEROL: 268 mg/dL — AB (ref 0–200)
HDL: 37 mg/dL — ABNORMAL LOW (ref >40)
LDL Cholesterol: 202 mg/dL — ABNORMAL HIGH (ref 0–99)
TRIGLYCERIDES: 145 mg/dL (ref <150)
Total CHOL/HDL Ratio: 7.2 RATIO
VLDL: 29 mg/dL (ref 0–40)

## 2018-07-23 LAB — HEMOGLOBIN A1C
HEMOGLOBIN A1C: 5.1 % (ref 4.8–5.6)
MEAN PLASMA GLUCOSE: 99.67 mg/dL

## 2018-07-23 MED ORDER — DOXEPIN HCL 25 MG PO CAPS
75.0000 mg | ORAL_CAPSULE | Freq: Every evening | ORAL | Status: DC | PRN
  Administered 2018-07-23 – 2018-07-25 (×3): 75 mg via ORAL
  Filled 2018-07-23 (×3): qty 3

## 2018-07-23 MED ORDER — IBUPROFEN 400 MG PO TABS: 400.0000 mg | ORAL_TABLET | Freq: Four times a day (QID) | ORAL | Status: DC | PRN

## 2018-07-23 MED ORDER — TRAMADOL HCL 50 MG PO TABS
50.0000 mg | ORAL_TABLET | Freq: Four times a day (QID) | ORAL | Status: DC | PRN
  Administered 2018-07-23 – 2018-07-25 (×4): 50 mg via ORAL
  Filled 2018-07-23 (×4): qty 1
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
## 2018-07-23 NOTE — [facility] Suicide Risk Assessment (Signed)
[place] INPATIENT:  Family/Significant Other Suicide Prevention Education  Suicide Prevention Education:  Education Completed; [name], adult daughter [phone]) has been identified by the patient as the family member/significant other with whom the patient will be residing, and identified as the person(s) who will aid the patient in the event of a mental health crisis (suicidal ideations/suicide attempt).  With written consent from the patient, the family member/significant other has been provided the following suicide prevention education, prior to the and/or following the discharge of the patient.  The suicide prevention education provided includes the following:  Suicide risk factors  Suicide prevention and interventions  National Suicide Hotline telephone number  [facility] assessment telephone number  [facility] Emergency Assistance [place] and/or Residential Mobile Crisis Unit telephone number  Request made of family/significant other to:  Remove weapons (e.g., guns, rifles, knives), all items previously/currently identified as safety concern.    Remove drugs/medications (over-the-counter, prescriptions, illicit drugs), all items previously/currently identified as a safety concern.  The family member/significant other verbalizes understanding of the suicide prevention education information provided.  The family member/significant other agrees to remove the items of safety concern listed above.  Marylee Floras 07/23/2018, 3:08 PM

## 2018-07-23 NOTE — Progress Notes (Signed)
[facility] MD Progress Note  07/23/2018 10:40 AM Janet Hester  MRN:  [id number] Subjective: Patient is seen and examined.  Patient is a 52 year old female with a past psychiatric history significant for major depression versus bipolar disorder; most recently depressed.  She is seen in follow-up.  She is unhappy this a.m.  She stated that the standard psychiatric hospital bed was very uncomfortable and she had significant pain last night.  She did not sleep well.  She stated that the doxepin did sedate her, she denied any side effects from it, but stated that she felt like the dosage needed to be increased.  She tolerated her first dose of Lamictal without problem.  Her mood and anxiety are essentially unchanged.  We discussed potentially increasing her doxepin, the rationale behind changing her Cymbalta to twice a day, potential for rash with the Lamictal, and obtaining a standard hospital bed to alleviate some of her discomfort. Principal Problem: <principal problem not specified> Diagnosis:   Patient Active Problem List   Diagnosis Date Noted  . Bipolar 1 disorder, depressed, severe ([place]) [F31.4] 07/22/2018  . MDD (major depressive disorder), recurrent severe, without psychosis ([place]) [F33.2] 04/16/2017  . Lichen sclerosus [A21.3] 04/29/2016  . Menopausal disorder [N95.9] 04/25/2016  . History of DVT in adulthood [Z86.718] 04/25/2016  . Depression [F32.9] 04/25/2016  . Hyperlipidemia with target LDL less than 160 [E78.5] 11/14/2015  . Obesity (BMI 35.0-39.9 without comorbidity) [E66.9] 11/14/2015  . Tobacco abuse [Z72.0] 11/14/2015  . Lymphedema of lower extremity [I89.0] 03/22/2015  . Primary hypercoagulable state ([place]) [D68.59] 03/15/2015  . Routine general medical examination at a health care facility [Z00.00] 03/15/2015  . Visit for screening mammogram [Z12.31] 03/15/2015  . Panic anxiety syndrome [F41.0] 03/15/2015  . Herpes labialis [B00.1] 11/23/2013  . Trochanteric bursitis of right hip  [M70.61] 10/27/2012  . DVT, lower extremity, distal ([place]) [I82.4Z9] 05/04/2012   Total Time spent with patient: 20 minutes  Past Psychiatric History: See admission H&P  Past Medical History:  Past Medical History:  Diagnosis Date  . Arthritis   . Bursitis   . Bursitis   . Depression   . DVT, lower extremity, distal ([place]) 05/04/2012   DVT in a branch off of LEFT prox popliteal vein, mid posterior tibial vein, and upper calf of the peroneal vein.   . Shingles   . Thrombus     Past Surgical History:  Procedure Laterality Date  . APPENDECTOMY    . LASER ABLATION     Family History:  Family History  Problem Relation Age of Onset  . Pulmonary embolism Mother   . Cancer Mother 74       cervical cancer  . Deep vein thrombosis Father   . Deep vein thrombosis Paternal Aunt   . Deep vein thrombosis Paternal Uncle   . Deep vein thrombosis Daughter   . Pulmonary embolism Daughter   . Deep vein thrombosis Daughter   . Colon cancer Neg Hx    Family Psychiatric  History: See admission H&P Social History:  Social History   Substance and Sexual Activity  Alcohol Use No  . Alcohol/week: 0.0 standard drinks     Social History   Substance and Sexual Activity  Drug Use No    Social History   Socioeconomic History  . Marital status: Single    Spouse name: Not on file  . Number of children: 3  . Years of education: Not on file  . Highest education level: Some college, no degree  Occupational  History    Employer: [employer]    Comment: accounting  Social Needs  . Financial resource strain: Somewhat hard  . Food insecurity:    Worry: Often true    Inability: Often true  . Transportation needs:    Medical: No    Non-medical: No  Tobacco Use  . Smoking status: Current Every Day Smoker    Packs/day: 1.00    Years: 20.00    Pack years: 20.00    Types: Cigarettes  . Smokeless tobacco: Never Used  Substance and Sexual Activity  . Alcohol use: No    Alcohol/week: 0.0 standard  drinks  . Drug use: No  . Sexual activity: Not Currently  Lifestyle  . Physical activity:    Days per week: 0 days    Minutes per session: 0 min  . Stress: Only a little  Relationships  . Social connections:    Talks on phone: Not on file    Gets together: Not on file    Attends religious service: Never    Active member of club or organization: No    Attends meetings of clubs or organizations: Never    Relationship status: Divorced  Other Topics Concern  . Not on file  Social History Narrative  . Not on file   Additional Social History:    Pain Medications: see MAR Prescriptions: see MAR Over the Counter: see MAR History of alcohol / drug use?: No history of alcohol / drug abuse Longest period of sobriety (when/how long): N/A                    Sleep: Poor  Appetite:  Fair  Current Medications: Current Facility-Administered Medications  Medication Dose Route Frequency Provider Last Rate Last Dose  . acetaminophen (TYLENOL) tablet 650 mg  650 mg Oral Q6H PRN Sharma Covert, MD   650 mg at 07/22/18 2300  . ARIPiprazole (ABILIFY) tablet 10 mg  10 mg Oral BH-q7a Rankin, Shuvon B, NP   10 mg at 07/23/18 0752  . doxepin (SINEQUAN) capsule 75 mg  75 mg Oral QHS PRN Sharma Covert, MD      . DULoxetine (CYMBALTA) DR capsule 60 mg  60 mg Oral BID Sharma Covert, MD   60 mg at 07/23/18 0752  . gabapentin (NEURONTIN) capsule 300 mg  300 mg Oral TID Rankin, Shuvon B, NP   300 mg at 07/23/18 0752  . hydrOXYzine (ATARAX/VISTARIL) tablet 25 mg  25 mg Oral Q6H PRN Sharma Covert, MD      . ibuprofen (ADVIL,MOTRIN) tablet 400 mg  400 mg Oral Q6H PRN Sharma Covert, MD      . lamoTRIgine (LAMICTAL) tablet 25 mg  25 mg Oral Daily Sharma Covert, MD   25 mg at 07/23/18 0752  . loratadine (CLARITIN) tablet 10 mg  10 mg Oral Daily Rankin, Shuvon B, NP   10 mg at 07/23/18 0752  . magnesium hydroxide (MILK OF MAGNESIA) NICU  ORAL  syringe  15 mL Oral Daily PRN  Sharma Covert, MD      . nicotine (NICODERM CQ - dosed in mg/24 hours) patch 21 mg  21 mg Transdermal Daily Sharma Covert, MD   21 mg at 07/23/18 0753  . rivaroxaban (XARELTO) tablet 20 mg  20 mg Oral Q supper Rankin, Shuvon B, NP   20 mg at 07/22/18 1654    Lab Results:  Results for orders placed or performed during the hospital encounter  of 07/22/18 (from the past 48 hour(s))  Urinalysis, Complete w Microscopic     Status: None   Collection Time: 07/22/18  4:33 PM  Result Value Ref Range   Color, Urine YELLOW YELLOW   APPearance CLEAR CLEAR   Specific Gravity, Urine 1.009 1.005 - 1.030   pH 7.0 5.0 - 8.0   Glucose, UA NEGATIVE NEGATIVE mg/dL   Hgb urine dipstick NEGATIVE NEGATIVE   Bilirubin Urine NEGATIVE NEGATIVE   Ketones, ur NEGATIVE NEGATIVE mg/dL   Protein, ur NEGATIVE NEGATIVE mg/dL   Nitrite NEGATIVE NEGATIVE   Leukocytes, UA NEGATIVE NEGATIVE   RBC / HPF 0-5 0 - 5 RBC/hpf   WBC, UA 0-5 0 - 5 WBC/hpf   Bacteria, UA NONE SEEN NONE SEEN   Mucus PRESENT     Comment: Performed at [facility], [place] [address], [place], [place] [address]  Urine rapid drug screen (hosp performed)not at [facility]     Status: Abnormal   Collection Time: 07/22/18  4:33 PM  Result Value Ref Range   Opiates NONE DETECTED NONE DETECTED   Cocaine NONE DETECTED NONE DETECTED   Benzodiazepines NONE DETECTED NONE DETECTED   Amphetamines NONE DETECTED NONE DETECTED   Tetrahydrocannabinol POSITIVE (A) NONE DETECTED   Barbiturates NONE DETECTED NONE DETECTED    Comment: (NOTE) DRUG SCREEN FOR MEDICAL PURPOSES ONLY.  IF CONFIRMATION IS NEEDED FOR ANY PURPOSE, NOTIFY LAB WITHIN 5 DAYS. LOWEST DETECTABLE LIMITS FOR URINE DRUG SCREEN Drug Class                     Cutoff (ng/mL) Amphetamine and metabolites    1000 Barbiturate and metabolites    200 Benzodiazepine                 604 Tricyclics and metabolites     300 Opiates and metabolites        300 Cocaine and metabolites         300 THC                            50 Performed at [facility], [place] [address], [place], [place] [address]   CBC     Status: None   Collection Time: 07/22/18  6:36 PM  Result Value Ref Range   WBC 6.5 4.0 - 10.5 K/uL   RBC 4.55 3.87 - 5.11 MIL/uL   Hemoglobin 13.3 12.0 - 15.0 g/dL   HCT 41.1 36.0 - 46.0 %   MCV 90.3 80.0 - 100.0 fL   MCH 29.2 26.0 - 34.0 pg   MCHC 32.4 30.0 - 36.0 g/dL   RDW 13.4 11.5 - 15.5 %   Platelets 296 150 - 400 K/uL   nRBC 0.0 0.0 - 0.2 %    Comment: Performed at [facility], [place] [address] [place], [place] [address]  Comprehensive metabolic panel     Status: Abnormal   Collection Time: 07/22/18  6:36 PM  Result Value Ref Range   Sodium 143 135 - 145 mmol/L   Potassium 3.6 3.5 - 5.1 mmol/L   Chloride 111 98 - 111 mmol/L   CO2 22 22 - 32 mmol/L   Glucose, Bld 118 (H) 70 - 99 mg/dL   BUN 8 6 - 20 mg/dL   Creatinine, Ser 0.93 0.44 - 1.00 mg/dL   Calcium 9.4 8.9 - 10.3 mg/dL   Total Protein 8.1 6.5 - 8.1 g/dL  Albumin 4.2 3.5 - 5.0 g/dL   AST 19 15 - 41 U/L   ALT 13 0 - 44 U/L   Alkaline Phosphatase 57 38 - 126 U/L   Total Bilirubin 1.0 0.3 - 1.2 mg/dL   GFR calc non Af Amer >60 >60 mL/min   GFR calc Af Amer >60 >60 mL/min    Comment: (NOTE) The eGFR has been calculated using the CKD EPI equation. This calculation has not been validated in all clinical situations. eGFR's persistently <60 mL/min signify possible Chronic Kidney Disease.    Anion gap 10 5 - 15    Comment: Performed at [facility], [place] [address], [place], [place] [address]  Hemoglobin A1c     Status: None   Collection Time: 07/22/18  6:36 PM  Result Value Ref Range   Hgb A1c MFr Bld 5.1 4.8 - 5.6 %    Comment: (NOTE) Pre diabetes:          5.7%-6.4% Diabetes:              >6.4% Glycemic control for   <7.0% adults with diabetes    Mean Plasma Glucose 99.67 mg/dL    Comment: Performed at [place] [address], [place], [place] [address]  Ethanol     Status: None   Collection Time: 07/22/18  6:36 PM  Result Value Ref Range   Alcohol, Ethyl (B) <10 <10 mg/dL    Comment: (NOTE) Lowest detectable limit for serum alcohol is 10 mg/dL. For medical purposes only. Performed at [facility], [place] [address] [place], [place] [address]   Lipid panel     Status: Abnormal   Collection Time: 07/22/18  6:36 PM  Result Value Ref Range   Cholesterol 268 (H) 0 - 200 mg/dL   Triglycerides 145 <150 mg/dL   HDL 37 (L) >40 mg/dL   Total CHOL/HDL Ratio 7.2 RATIO   VLDL 29 0 - 40 mg/dL   LDL Cholesterol 202 (H) 0 - 99 mg/dL    Comment:        Total Cholesterol/HDL:CHD Risk Coronary Heart Disease Risk Table                     Men   Women  1/2 Average Risk   3.4   3.3  Average Risk       5.0   4.4  2 X Average Risk   9.6   7.1  3 X Average Risk  23.4   11.0        Use the calculated Patient Ratio above and the CHD Risk Table to determine the patient's CHD Risk.        ATP III CLASSIFICATION (LDL):  <100     mg/dL   Optimal  100-129  mg/dL   Near or Above                    Optimal  130-159  mg/dL   Borderline  160-189  mg/dL   High  >190     mg/dL   Very High Performed at [place] [address] [place], [place] [address]   TSH     Status: None   Collection Time: 07/22/18  6:36 PM  Result Value Ref Range   TSH 0.646 0.350 - 4.500 uIU/mL    Comment: Performed by a 3rd Generation assay with a functional sensitivity of <=0.01 uIU/mL. Performed at [facility], [place] [address], [place], [place] [address]     Blood Alcohol level:  Lab Results  Component Value Date   [facility] <10 07/22/2018   ETH <10 63/84/6659    Metabolic Disorder Labs: Lab Results  Component Value Date   HGBA1C 5.1 07/22/2018   MPG 99.67 07/22/2018   No results found for: PROLACTIN Lab Results  Component Value Date   CHOL 268 (H) 07/22/2018    TRIG 145 07/22/2018   HDL 37 (L) 07/22/2018   CHOLHDL 7.2 07/22/2018   VLDL 29 07/22/2018   LDLCALC 202 (H) 07/22/2018   LDLCALC 147 (H) 11/14/2015    Physical Findings: AIMS: Facial and Oral Movements Muscles of Facial Expression: None, normal Lips and Perioral Area: None, normal Jaw: None, normal Tongue: None, normal,Extremity Movements Upper (arms, wrists, hands, fingers): None, normal Lower (legs, knees, ankles, toes): None, normal, Trunk Movements Neck, shoulders, hips: None, normal, Overall Severity Severity of abnormal movements (highest score from questions above): None, normal Incapacitation due to abnormal movements: None, normal Patient's awareness of abnormal movements (rate only patient's report): No Awareness, Dental Status Current problems with teeth and/or dentures?: No Does patient usually wear dentures?: No  CIWA:    COWS:     Musculoskeletal: Strength & Muscle Tone: within normal limits Gait & Station: normal Patient leans: N/A  Psychiatric Specialty Exam: Physical Exam  Nursing note and vitals reviewed. Constitutional: She is oriented to person, place, and time. She appears well-developed and well-nourished.  HENT:  Head: Normocephalic and atraumatic.  Respiratory: Effort normal.  Neurological: She is alert and oriented to person, place, and time.    ROS  Blood pressure 112/81, pulse 94, temperature 98.6 F (37 C), resp. rate 16, height 6' (1.829 m), weight 125.6 kg, SpO2 99 %.Body mass index is 37.57 kg/m.  General Appearance: Casual  Eye Contact:  Fair  Speech:  Slow  Volume:  Decreased  Mood:  Anxious and Depressed  Affect:  Congruent  Thought Process:  Coherent and Descriptions of Associations: Intact  Orientation:  Full (Time, Place, and Person)  Thought Content:  Logical  Suicidal Thoughts:  No  Homicidal Thoughts:  No  Memory:  Immediate;   Fair Recent;   Fair Remote;   Fair  Judgement:  Intact  Insight:  Fair  Psychomotor  Activity:  Decreased  Concentration:  Concentration: Fair and Attention Span: Fair  Recall:  [employer] of Knowledge:  Fair  Language:  Fair  Akathisia:  Negative  Handed:  Right  AIMS (if indicated):     Assets:  Communication Skills Desire for Improvement Financial Resources/Insurance Housing Resilience Social Support  ADL's:  Intact  Cognition:  WNL  Sleep:  Number of Hours: 4.25     Treatment Plan Summary: Daily contact with patient to assess and evaluate symptoms and progress in treatment, Medication management and Plan : Patient is seen and examined.  Patient is a 52 year old female with the above-stated past psychiatric history seen in follow-up.  #1 major depression versus bipolar disorder; depressed-continue Cymbalta 60 mg p.o. twice daily for mood and anxiety, continue Abilify 10 mg p.o. daily for depression augmentation, continue Lamictal 25 mg p.o. every afternoon for mood stability.  #2 back pain/arthritis-we will attempt to get a standard hospital bed for her.  #3 history of DVT-continue Xarelto as written.  #4 insomnia-increase doxepin to 75 mg p.o. nightly for sleep and mood.  #5 disposition planning-in progress  Sharma Covert, MD 07/23/2018, 10:40 AM

## 2018-07-23 NOTE — Progress Notes (Deleted)
D.  Pt is a new admission today, has remained in bed so far this shift.  Pt did not feel able to attend evening wrap up group.  Pt does endorse passive SI but verbally agrees to safety on the unit.  Pt denies HI/AVH at this time.  Pt is a high fall risk and ambulates with a walker.  She has brace on legs bilaterally.  Pt has order for braces as well as her shoes due to unsteady gate.  A.  Support and encouragement offered, medication given as ordered, re-enforced fall precaution safety.  R.  Pt remains safe on the unit, will continue to monitor.

## 2018-07-23 NOTE — Progress Notes (Signed)
D.  Pt pleasant on approach, complaint of pain from bursitis.  Pt states that last night the bed she was sleeping on exacerbated this.  See pain flowsheet.  Pt was positive for evening wrap up group, observed appropriately engaged with peers on the unit.  Pt denies SI/HI/AVH at this time.  A.  Support and encouragement offered, medication given as ordered  R.  Pt remains safe on the unit, will continue to monitor.

## 2018-07-23 NOTE — BHH Group Notes (Signed)
LCSW Group Therapy Note 07/23/2018 2:19 PM  Type of Therapy/Topic: Group Therapy: Emotion Regulation  Participation Level: Did Not Attend   Description of Group:  The purpose of this group is to assist patients in learning to regulate negative emotions and experience positive emotions. Patients will be guided to discuss ways in which they have been vulnerable to their negative emotions. These vulnerabilities will be juxtaposed with experiences of positive emotions or situations, and patients will be challenged to use positive emotions to combat negative ones. Special emphasis will be placed on coping with negative emotions in conflict situations, and patients will process healthy conflict resolution skills.  Therapeutic Goals: 1. Patient will identify two positive emotions or experiences to reflect on in order to balance out negative emotions 2. Patient will label two or more emotions that they find the most difficult to experience 3. Patient will demonstrate positive conflict resolution skills through discussion and/or role plays  Summary of Patient Progress:  Invited, chose not to attend.    Therapeutic Modalities:  Cognitive Behavioral Therapy Feelings Identification Dialectical Behavioral Therapy   Theresa Duty Clinical Social Worker

## 2018-07-23 NOTE — Progress Notes (Signed)
D: Patient denies SI, HI or AVH. Patient initially sleeping upon assessment attempt.  She awoke later in the evening and was anxious/irritable.  Pt. Took her evening medication and requested tylenol for her chronic hip/back pain flare up d/t the bed per patients report.  She did not attend evening wrap up group and denied any other needs.    A: Patient given emotional support from RN. Patient encouraged to come to staff with concerns and/or questions. Patient's medication routine continued. Patient's orders and plan of care reviewed.   R: Patient remains appropriate and cooperative. Will continue to monitor patient q15 minutes for safety.

## 2018-07-23 NOTE — Progress Notes (Signed)
Recreation Therapy Notes  Date: 10.11.19 Time: 0930 Location: 300 Hall Dayroom  Group Topic: Stress Management  Goal Area(s) Addresses:  Patient will verbalize importance of using healthy stress management.  Patient will identify positive emotions associated with healthy stress management.   Intervention: Stress Management  Activity :  Meditation.  LRT introduced patients to the stress management technique of meditation.  LRT played a meditation for patients to engage and follow along.  Education:  Stress Management, Discharge Planning.   Education Outcome: Acknowledges edcuation/In group clarification offered/Needs additional education  Clinical Observations/Feedback: Pt did not attend group.    Victorino Sparrow, LRT/CTRS         Ria Comment, Mouhamadou Gittleman A 07/23/2018 11:08 AM

## 2018-07-23 NOTE — Progress Notes (Addendum)
D: Patient is drowsy this morning and is remaining in bed.  She is complaining of pain in her hips and back.  Patient will move to a room with a medical bed later today.  She denies any thoughts of self harm.  She is irritable and depressed.  She rates her depressive symptoms as a 5; hopelessness as a 5, and anxiety as an 8.  Her goal today is to "get better rest and less pain."  Patient has tramadol ordered for her hip pain.  She has had good results.  A: Continue to monitor medication management and MD orders.  Safety checks completed every 15 minutes per protocol.  Offer support and encouragement as needed.  R: Patient is receptive to staff; her behavior is appropriate.

## 2018-07-23 NOTE — Tx Team (Signed)
Interdisciplinary Treatment and Diagnostic Plan Update  07/23/2018 Time of Session: 9:25am Janet Hester MRN: [id number]  Principal Diagnosis: <principal problem not specified>  Secondary Diagnoses: Active Problems:   Bipolar 1 disorder, depressed, severe (HCC)   Current Medications:  Current Facility-Administered Medications  Medication Dose Route Frequency Provider Last Rate Last Dose  . acetaminophen (TYLENOL) tablet 650 mg  650 mg Oral Q6H PRN Sharma Covert, MD   650 mg at 07/22/18 2300  . ARIPiprazole (ABILIFY) tablet 10 mg  10 mg Oral BH-[id number] Rankin, Shuvon B, NP   10 mg at 07/23/18 0752  . doxepin (SINEQUAN) capsule 75 mg  75 mg Oral QHS PRN Sharma Covert, MD      . DULoxetine (CYMBALTA) DR capsule 60 mg  60 mg Oral BID Sharma Covert, MD   60 mg at 07/23/18 0752  . gabapentin (NEURONTIN) capsule 300 mg  300 mg Oral TID Rankin, Shuvon B, NP   300 mg at 07/23/18 0752  . hydrOXYzine (ATARAX/VISTARIL) tablet 25 mg  25 mg Oral Q6H PRN Sharma Covert, MD      . lamoTRIgine (LAMICTAL) tablet 25 mg  25 mg Oral Daily Sharma Covert, MD   25 mg at 07/23/18 0752  . loratadine (CLARITIN) tablet 10 mg  10 mg Oral Daily Rankin, Shuvon B, NP   10 mg at 07/23/18 0752  . magnesium hydroxide (MILK OF MAGNESIA) NICU  ORAL  syringe  15 mL Oral Daily PRN Sharma Covert, MD      . nicotine (NICODERM CQ - dosed in mg/24 hours) patch 21 mg  21 mg Transdermal Daily Sharma Covert, MD   21 mg at 07/23/18 0753  . rivaroxaban (XARELTO) tablet 20 mg  20 mg Oral Q supper Rankin, Shuvon B, NP   20 mg at 07/22/18 1654  . traMADol (ULTRAM) tablet 50 mg  50 mg Oral Q6H PRN Sharma Covert, MD       PTA Medications: Medications Prior to Admission  Medication Sig Dispense Refill Last Dose  . ARIPiprazole (ABILIFY) 10 MG tablet Take 1 tablet (10 mg total) by mouth every morning. 90 tablet 0   . calcium carbonate (TUMS EX) 750 MG chewable tablet Chew 2 tablets by mouth as needed for  heartburn.   Taking  . cetirizine (ZYRTEC) 10 MG tablet Take 10 mg by mouth daily as needed for allergies.   Taking  . DULoxetine (CYMBALTA) 60 MG capsule Take 2 capsules (120 mg total) by mouth every morning. 180 capsule 0   . EPINEPHrine (EPIPEN) 0.3 mg/0.3 mL IJ SOAJ injection Inject 0.3 mLs (0.3 mg total) into the muscle once as needed. 1 Device 1 Taking  . gabapentin (NEURONTIN) 300 MG capsule Take 1 capsule (300 mg total) by mouth 3 (three) times daily. 90 capsule 0 Taking  . hydrOXYzine (ATARAX/VISTARIL) 25 MG tablet Take 1 tablet (25 mg total) by mouth daily as needed for anxiety. 90 tablet 0   . ibuprofen (ADVIL,MOTRIN) 600 MG tablet Take 1 tablet (600 mg total) by mouth every 6 (six) hours as needed. 30 tablet 0 Taking  . methocarbamol (ROBAXIN) 500 MG tablet Take 1 tablet (500 mg total) by mouth 2 (two) times daily. 20 tablet 0 Taking  . rivaroxaban (XARELTO) 20 MG TABS tablet Take 1 tablet (20 mg total) by mouth daily with supper. 30 tablet 0   . topiramate (TOPAMAX) 50 MG tablet 1/2 tablet nightly for 3 nights, then full tablet for 3 nights, then  2 tablets nightly 369 tablet 0     Patient Stressors: Financial difficulties Medication change or noncompliance Occupational concerns  Patient Strengths: Average or above average Architect for treatment/growth Supportive family/friends  Treatment Modalities: Medication Management, Group therapy, Case management,  1 to 1 session with clinician, Psychoeducation, Recreational therapy.   Physician Treatment Plan for Primary Diagnosis: <principal problem not specified> Long Term Goal(s): Improvement in symptoms so as ready for discharge Improvement in symptoms so as ready for discharge   Short Term Goals: Ability to identify changes in lifestyle to reduce recurrence of condition will improve Ability to verbalize feelings will improve Ability to disclose and discuss suicidal ideas Ability to  demonstrate self-control will improve Ability to identify and develop effective coping behaviors will improve Ability to maintain clinical measurements within normal limits will improve Ability to identify changes in lifestyle to reduce recurrence of condition will improve Ability to verbalize feelings will improve Ability to disclose and discuss suicidal ideas Ability to demonstrate self-control will improve Ability to identify and develop effective coping behaviors will improve Ability to maintain clinical measurements within normal limits will improve  Medication Management: Evaluate patient's response, side effects, and tolerance of medication regimen.  Therapeutic Interventions: 1 to 1 sessions, Unit Group sessions and Medication administration.  Evaluation of Outcomes: Not Met  Physician Treatment Plan for Secondary Diagnosis: Active Problems:   Bipolar 1 disorder, depressed, severe ([name])  Long Term Goal(s): Improvement in symptoms so as ready for discharge Improvement in symptoms so as ready for discharge   Short Term Goals: Ability to identify changes in lifestyle to reduce recurrence of condition will improve Ability to verbalize feelings will improve Ability to disclose and discuss suicidal ideas Ability to demonstrate self-control will improve Ability to identify and develop effective coping behaviors will improve Ability to maintain clinical measurements within normal limits will improve Ability to identify changes in lifestyle to reduce recurrence of condition will improve Ability to verbalize feelings will improve Ability to disclose and discuss suicidal ideas Ability to demonstrate self-control will improve Ability to identify and develop effective coping behaviors will improve Ability to maintain clinical measurements within normal limits will improve     Medication Management: Evaluate patient's response, side effects, and tolerance of medication  regimen.  Therapeutic Interventions: 1 to 1 sessions, Unit Group sessions and Medication administration.  Evaluation of Outcomes: Not Met   RN Treatment Plan for Primary Diagnosis: <principal problem not specified> Long Term Goal(s): Knowledge of disease and therapeutic regimen to maintain health will improve  Short Term Goals: Ability to participate in decision making will improve, Ability to disclose and discuss suicidal ideas, Ability to identify and develop effective coping behaviors will improve and Compliance with prescribed medications will improve  Medication Management: RN will administer medications as ordered by provider, will assess and evaluate patient's response and provide education to patient for prescribed medication. RN will report any adverse and/or side effects to prescribing provider.  Therapeutic Interventions: 1 on 1 counseling sessions, Psychoeducation, Medication administration, Evaluate responses to treatment, Monitor vital signs and CBGs as ordered, Perform/monitor CIWA, COWS, AIMS and Fall Risk screenings as ordered, Perform wound care treatments as ordered.  Evaluation of Outcomes: Not Met   LCSW Treatment Plan for Primary Diagnosis: <principal problem not specified> Long Term Goal(s): Safe transition to appropriate next level of care at discharge, Engage patient in therapeutic group addressing interpersonal concerns.  Short Term Goals: Engage patient in aftercare planning with referrals and resources  Therapeutic Interventions:  Assess for all discharge needs, 1 to 1 time with Social worker, Explore available resources and support systems, Assess for adequacy in community support network, Educate family and significant other(s) on suicide prevention, Complete Psychosocial Assessment, Interpersonal group therapy.  Evaluation of Outcomes: Not Met   Progress in Treatment: Attending groups: No. Participating in groups: No. Taking medication as prescribed:  Yes. Toleration medication: Yes. Family/Significant other contact made: No, will contact:  the patient's daughter Patient understands diagnosis: Yes. Discussing patient identified problems/goals with staff: Yes. Medical problems stabilized or resolved: Yes. Denies suicidal/homicidal ideation: Yes. Issues/concerns per patient self-inventory: No. Other:   New problem(s) identified: None   New Short Term/Long Term Goal(s): medication stabilization, elimination of SI thoughts, development of comprehensive mental wellness plan.   Patient Goals: "I want to get my medications together and get some rest"   Discharge Plan or Barriers: Patient plans to discharge home with her adult daughter and continue to follow up with [facility] (Dr. Adele Schilder) for medication management and therapy services.   Reason for Continuation of Hospitalization: Depression Medication stabilization  Estimated Length of Stay:3-5 days   Attendees: Patient: Janet Hester 07/23/2018 11:42 AM  Physician: Dr. Myles Lipps, MD 07/23/2018 11:42 AM  Nursing: Yetta Flock.Jacinto Reap, RN 07/23/2018 11:42 AM  RN Care Manager: Lars Pinks, RN 07/23/2018 11:42 AM  Social Worker: Radonna Ricker, [place] 07/23/2018 11:42 AM  Recreational Therapist: Rhunette Croft 07/23/2018 11:42 AM  Other: Darnelle Maffucci money, NP 07/23/2018 11:42 AM  Other:  07/23/2018 11:42 AM  Other: 07/23/2018 11:42 AM    Scribe for Treatment Team: Marylee Floras, [place] 07/23/2018 11:42 AM

## 2018-07-24 NOTE — Progress Notes (Signed)
D.  Pt pl;easant on approach, complaint of continued bursitis pain.  Pt was positive for evening wrap up group with appropriate participation.  Pt observed with appropriate but minimal interaction with peers on the unit.  Pt denies SI/HI/AVH at this time.  A.  Support and encouragement offered, medication given as ordered  R.  Pt remains safe on the unit,will continue to monitor.

## 2018-07-24 NOTE — [facility] Group Notes (Signed)
[facility] Group Notes:  (Nursing/MHT/Case Management/Adjunct)  Date:  07/24/2018  Time:  4:53 PM  Type of Therapy:  Nurse Education  Participation Level:  Did Not Attend  Participation Quality:  Resistant  Modes of Intervention:  Discussion and Education  Summary of Progress/Problems: In this group, we reflected on the day and what each patient learned. We discussed how we can make changes to prevent returning to the hospital. We also discussed TIPP distress tolerance skills, and how to use them to bring down elevated emotion. Patients were educated on when to use distress tolerance skills.  Lesli Albee 07/24/2018, 4:53 PM

## 2018-07-24 NOTE — [facility] Group Notes (Signed)
[facility] Group Notes:  (Nursing/MHT/Case Management/Adjunct)  Date:  07/24/2018  Time:  2:36 PM  Type of Therapy:  Nurse Education  Participation Level:  Active  Participation Quality:  Appropriate and Attentive  Affect:  Appropriate  Cognitive:  Alert and Appropriate  Insight:  Good  Engagement in Group:  Developing/Improving  Modes of Intervention:  Discussion and Education  Summary of Progress/Problems: In this group, RN discussed needs assessment, behaviors and behavior modification. Patients were asked to share what things are needed every day to both live and thrive. They then discussed behaviors that result from not having our needs met, and ways they can make choices to change those behaviors. For homework, they were asked to take the love languages test listed in the workbook and begin to communicate their love languages to others here and at home.  Lesli Albee 07/24/2018, 2:36 PM

## 2018-07-24 NOTE — [facility] Group Notes (Signed)
[facility] LCSW Group Therapy Note  Date/Time:    07/24/2018   10:15 - 11:15 AM  Type of Therapy and Topic:  Group Therapy:  Thought Distortions  Participation Level:  Did Not Attend   Description of Group:  In this group, patients were introduced to cognitive distortions and discussed how these can negatively affect lives.  This included All or Nothing thinking, Labeling, Focusing on the Negative, the Shoulds, Blaming, Predicting the Future, Overgeneralization, Mind Reading, Catastrophizing, Emotional Reasoning, Personalization, and Jumping to Conclusions.  Patients then used a worksheet to describe an upsetting event in their life and the Automatic Thoughts they had about that event.  They identified the type of cognitive distortion they used and worked with the group to come up with a more realistic thought to refute their original Automatic Thought.  Therapeutic Goals: 1. Patient will be able to identify this exercise of examining their thoughts as a healthy coping mechanism. 2. Patient will identify a problem area in their life and the automatic thoughts they have about that situation. 3. Patient will verbalize the feelings and outcomes typically associated with their thoughts. 4. Patient will think about and discuss the available evidence supporting their thoughts, as well as assumptions they have to make to believe it. 5. Patient will identify evidence or facts that refute their cognitive distortion. 6. Patient will verbalize a more realistic, helpful conclusion.  Summary of Patient Progress:  N/A   Therapeutic Modalities Cognitive Behavioral Therapy Dialectical Behavioral Therapy  Selmer Dominion, LCSW 07/24/2018 12:00PM

## 2018-07-24 NOTE — Plan of Care (Signed)
D: Patient presents flat, depressed. She is taking naps during the day, and participating minimally with treatment. She declined to fill out a self-inventory. She is medication compliant, however. She reports her mood is "okay," but her affect is incongruent. She denies physical pain. Patient denies SI/HI/AVH.  A: Patient checked q15 min, and checks reviewed. Reviewed medication changes with patient and educated on side effects. Educated patient on importance of attending group therapy sessions and educated on several coping skills. Encouarged participation in milieu through recreation therapy and attending meals with peers. Support and encouragement provided. Fluids offered. R: Patient receptive to education on medications, and is medication compliant. Patient contracts for safety on the unit.

## 2018-07-24 NOTE — [facility] Group Notes (Signed)
Adult Psychoeducational Group Note  Date:  07/24/2018 Time:  9:45 PM  Group Topic/Focus:  Wrap-Up Group:   The focus of this group is to help patients review their daily goal of treatment and discuss progress on daily workbooks.  Participation Level:  Active  Participation Quality:  Appropriate and Attentive  Affect:  Appropriate  Cognitive:  Alert and Appropriate  Insight: Appropriate and Good  Engagement in Group:  Engaged  Modes of Intervention:  Discussion and Education  Additional Comments:  Pt attended and participated in wrap up group this evening. Pt rated their day a 9/10, due to them getting closer to going home. Pt goal was to speak with the Dr and to move their discharge date from Monday to tomorrow. Pt is still working on that goal.   Cristi Loron 07/24/2018, 9:45 PM

## 2018-07-24 NOTE — Progress Notes (Signed)
[facility] MD Progress Note  07/24/2018 11:14 AM Janet Hester  MRN:  [id number] Subjective:  Patient is seen and examined. Patient is a 52 year old female with a reported past psychiatric history significant for major depression versus bipolar disorder; most recently depressed.  Patient states she is feeling a bit better today.  She stated that her depression has decreased.  She states she slept better on the hospital bed.  She stated her pain was stable.  She denied any manic symptoms.  She denied any side effects to her current medications.  She denied any suicidal ideation.  Blood pressure is well controlled.  Heart rate is 76.  Principal Problem: <principal problem not specified> Diagnosis:   Patient Active Problem List   Diagnosis Date Noted  . Bipolar 1 disorder, depressed, severe ([place]) [F31.4] 07/22/2018  . MDD (major depressive disorder), recurrent severe, without psychosis ([place]) [F33.2] 04/16/2017  . Lichen sclerosus [E33.2] 04/29/2016  . Menopausal disorder [N95.9] 04/25/2016  . History of DVT in adulthood [Z86.718] 04/25/2016  . Depression [F32.9] 04/25/2016  . Hyperlipidemia with target LDL less than 160 [E78.5] 11/14/2015  . Obesity (BMI 35.0-39.9 without comorbidity) [E66.9] 11/14/2015  . Tobacco abuse [Z72.0] 11/14/2015  . Lymphedema of lower extremity [I89.0] 03/22/2015  . Primary hypercoagulable state ([place]) [D68.59] 03/15/2015  . Routine general medical examination at a health care facility [Z00.00] 03/15/2015  . Visit for screening mammogram [Z12.31] 03/15/2015  . Panic anxiety syndrome [F41.0] 03/15/2015  . Herpes labialis [B00.1] 11/23/2013  . Trochanteric bursitis of right hip [M70.61] 10/27/2012  . DVT, lower extremity, distal ([place]) [I82.4Z9] 05/04/2012   Total Time spent with patient: 20 minutes  Past Psychiatric History: See admission H&P  Past Medical History:  Past Medical History:  Diagnosis Date  . Arthritis   . Bursitis   . Bursitis   . Depression   .  DVT, lower extremity, distal ([place]) 05/04/2012   DVT in a branch off of LEFT prox popliteal vein, mid posterior tibial vein, and upper calf of the peroneal vein.   . Shingles   . Thrombus     Past Surgical History:  Procedure Laterality Date  . APPENDECTOMY    . LASER ABLATION     Family History:  Family History  Problem Relation Age of Onset  . Pulmonary embolism Mother   . Cancer Mother 50       cervical cancer  . Deep vein thrombosis Father   . Deep vein thrombosis Paternal Aunt   . Deep vein thrombosis Paternal Uncle   . Deep vein thrombosis Daughter   . Pulmonary embolism Daughter   . Deep vein thrombosis Daughter   . Colon cancer Neg Hx    Family Psychiatric  History: See admission H&P Social History:  Social History   Substance and Sexual Activity  Alcohol Use No  . Alcohol/week: 0.0 standard drinks     Social History   Substance and Sexual Activity  Drug Use No    Social History   Socioeconomic History  . Marital status: Single    Spouse name: Not on file  . Number of children: 3  . Years of education: Not on file  . 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 Financial resource strain: Somewhat hard  . Food insecurity:    Worry: Often true    Inability: Often true  . Transportation needs:    Medical: No  Non-medical: No  Tobacco Use  . Smoking status: Current Every Day Smoker    Packs/day: 1.00    Years: 20.00    Pack years: 20.00    Types: Cigarettes  . Smokeless tobacco: Never Used  Substance and Sexual Activity  . Alcohol use: No    Alcohol/week: 0.0 standard drinks  . Drug use: No  . Sexual activity: Not Currently  Lifestyle  . Physical activity:    Days per week: 0 days    Minutes per session: 0 min  . Stress: Only a little  Relationships  . Social connections:    Talks on phone: Not on file    Gets together: Not on file    Attends religious  service: Never    Active member of club or organization: No    Attends meetings of clubs or organizations: Never    Relationship status: Divorced  Other Topics Concern  . Not on file  Social History Narrative  . Not on file   Additional Social History:    Pain Medications: see MAR Prescriptions: see MAR Over the Counter: see MAR History of alcohol / drug use?: No history of alcohol / drug abuse Longest period of sobriety (when/how long): N/A                    Sleep: Fair  Appetite:  Good  Current Medications: Current Facility-Administered Medications  Medication Dose Route Frequency Provider Last Rate Last Dose  . acetaminophen (TYLENOL) tablet 650 mg  650 mg Oral Q6H PRN Sharma Covert, MD   650 mg at 07/22/18 2300  . ARIPiprazole (ABILIFY) tablet 10 mg  10 mg Oral BH-q7a Rankin, Shuvon B, NP   10 mg at 07/24/18 0804  . doxepin (SINEQUAN) capsule 75 mg  75 mg Oral QHS PRN Sharma Covert, MD   75 mg at 07/23/18 2137  . DULoxetine (CYMBALTA) DR capsule 60 mg  60 mg Oral BID Sharma Covert, MD   60 mg at 07/24/18 0804  . gabapentin (NEURONTIN) capsule 300 mg  300 mg Oral TID Rankin, Shuvon B, NP   300 mg at 07/24/18 0804  . hydrOXYzine (ATARAX/VISTARIL) tablet 25 mg  25 mg Oral Q6H PRN Sharma Covert, MD   25 mg at 07/23/18 2137  . lamoTRIgine (LAMICTAL) tablet 25 mg  25 mg Oral Daily Sharma Covert, MD   25 mg at 07/24/18 0804  . loratadine (CLARITIN) tablet 10 mg  10 mg Oral Daily Rankin, Shuvon B, NP   10 mg at 07/24/18 0804  . magnesium hydroxide (MILK OF MAGNESIA) NICU  ORAL  syringe  15 mL Oral Daily PRN Sharma Covert, MD      . nicotine (NICODERM CQ - dosed in mg/24 hours) patch 21 mg  21 mg Transdermal Daily Sharma Covert, MD   21 mg at 07/24/18 0803  . rivaroxaban (XARELTO) tablet 20 mg  20 mg Oral Q supper Rankin, Shuvon B, NP   20 mg at 07/23/18 1719  . traMADol (ULTRAM) tablet 50 mg  50 mg Oral Q6H PRN Sharma Covert, MD   50 mg  at 07/23/18 2137    Lab Results:  Results for orders placed or performed during the hospital encounter of 07/22/18 (from the past 48 hour(s))  Urinalysis, Complete w Microscopic     Status: None   Collection Time: 07/22/18  4:33 PM  Result Value Ref Range   Color, Urine YELLOW YELLOW   APPearance  CLEAR CLEAR   Specific Gravity, Urine 1.009 1.005 - 1.030   pH 7.0 5.0 - 8.0   Glucose, UA NEGATIVE NEGATIVE mg/dL   Hgb urine dipstick NEGATIVE NEGATIVE   Bilirubin Urine NEGATIVE NEGATIVE   Ketones, ur NEGATIVE NEGATIVE mg/dL   Protein, ur NEGATIVE NEGATIVE mg/dL   Nitrite NEGATIVE NEGATIVE   Leukocytes, UA NEGATIVE NEGATIVE   RBC / HPF 0-5 0 - 5 RBC/hpf   WBC, UA 0-5 0 - 5 WBC/hpf   Bacteria, UA NONE SEEN NONE SEEN   Mucus PRESENT     Comment: Performed at [facility], [place] [address] [place], [place] [address]  Urine rapid drug screen (hosp performed)not at [facility]     Status: Abnormal   Collection Time: 07/22/18  4:33 PM  Result Value Ref Range   Opiates NONE DETECTED NONE DETECTED   Cocaine NONE DETECTED NONE DETECTED   Benzodiazepines NONE DETECTED NONE DETECTED   Amphetamines NONE DETECTED NONE DETECTED   Tetrahydrocannabinol POSITIVE (A) NONE DETECTED   Barbiturates NONE DETECTED NONE DETECTED    Comment: (NOTE) DRUG SCREEN FOR MEDICAL PURPOSES ONLY.  IF CONFIRMATION IS NEEDED FOR ANY PURPOSE, NOTIFY LAB WITHIN 5 DAYS. LOWEST DETECTABLE LIMITS FOR URINE DRUG SCREEN Drug Class                     Cutoff (ng/mL) Amphetamine and metabolites    1000 Barbiturate and metabolites    200 Benzodiazepine                 660 Tricyclics and metabolites     300 Opiates and metabolites        300 Cocaine and metabolites        300 THC                            50 Performed at [facility], [place] [address], [place], [place] [address]   CBC     Status: None   Collection Time: 07/22/18  6:36 PM  Result Value Ref Range   WBC 6.5 4.0 - 10.5 K/uL    RBC 4.55 3.87 - 5.11 MIL/uL   Hemoglobin 13.3 12.0 - 15.0 g/dL   HCT 41.1 36.0 - 46.0 %   MCV 90.3 80.0 - 100.0 fL   MCH 29.2 26.0 - 34.0 pg   MCHC 32.4 30.0 - 36.0 g/dL   RDW 13.4 11.5 - 15.5 %   Platelets 296 150 - 400 K/uL   nRBC 0.0 0.0 - 0.2 %    Comment: Performed at [facility], [place] [address] [place], [place] [address]  Comprehensive metabolic panel     Status: Abnormal   Collection Time: 07/22/18  6:36 PM  Result Value Ref Range   Sodium 143 135 - 145 mmol/L   Potassium 3.6 3.5 - 5.1 mmol/L   Chloride 111 98 - 111 mmol/L   CO2 22 22 - 32 mmol/L   Glucose, Bld 118 (H) 70 - 99 mg/dL   BUN 8 6 - 20 mg/dL   Creatinine, Ser 0.93 0.44 - 1.00 mg/dL   Calcium 9.4 8.9 - 10.3 mg/dL   Total Protein 8.1 6.5 - 8.1 g/dL   Albumin 4.2 3.5 - 5.0 g/dL   AST 19 15 - 41 U/L   ALT 13 0 - 44 U/L   Alkaline Phosphatase 57 38 - 126 U/L   Total Bilirubin 1.0 0.3 -  1.2 mg/dL   GFR calc non Af Amer >60 >60 mL/min   GFR calc Af Amer >60 >60 mL/min    Comment: (NOTE) The eGFR has been calculated using the CKD EPI equation. This calculation has not been validated in all clinical situations. eGFR's persistently <60 mL/min signify possible Chronic Kidney Disease.    Anion gap 10 5 - 15    Comment: Performed at [facility], [place] [address], [place], [place] [address]  Hemoglobin A1c     Status: None   Collection Time: 07/22/18  6:36 PM  Result Value Ref Range   Hgb A1c MFr Bld 5.1 4.8 - 5.6 %    Comment: (NOTE) Pre diabetes:          5.7%-6.4% Diabetes:              >6.4% Glycemic control for   <7.0% adults with diabetes    Mean Plasma Glucose 99.67 mg/dL    Comment: Performed at [place] [address] [place], [place] [address]  Ethanol     Status: None   Collection Time: 07/22/18  6:36 PM  Result Value Ref Range   Alcohol, Ethyl (B) <10 <10 mg/dL    Comment: (NOTE) Lowest detectable limit for serum alcohol is 10 mg/dL. For  medical purposes only. Performed at [facility], [place] [address], [place], [place] [address]   Lipid panel     Status: Abnormal   Collection Time: 07/22/18  6:36 PM  Result Value Ref Range   Cholesterol 268 (H) 0 - 200 mg/dL   Triglycerides 145 <150 mg/dL   HDL 37 (L) >40 mg/dL   Total CHOL/HDL Ratio 7.2 RATIO   VLDL 29 0 - 40 mg/dL   LDL Cholesterol 202 (H) 0 - 99 mg/dL    Comment:        Total Cholesterol/HDL:CHD Risk Coronary Heart Disease Risk Table                     Men   Women  1/2 Average Risk   3.4   3.3  Average Risk       5.0   4.4  2 X Average Risk   9.6   7.1  3 X Average Risk  23.4   11.0        Use the calculated Patient Ratio above and the CHD Risk Table to determine the patient's CHD Risk.        ATP III CLASSIFICATION (LDL):  <100     mg/dL   Optimal  100-129  mg/dL   Near or Above                    Optimal  130-159  mg/dL   Borderline  160-189  mg/dL   High  >190     mg/dL   Very High Performed at [place] [address], [place], [place] [address]   TSH     Status: None   Collection Time: 07/22/18  6:36 PM  Result Value Ref Range   TSH 0.646 0.350 - 4.500 uIU/mL    Comment: Performed by a 3rd Generation assay with a functional sensitivity of <=0.01 uIU/mL. Performed at [facility], [place] [address], [place], [place] [address]     Blood Alcohol level:  Lab Results  Component Value Date   [facility] <10 07/22/2018   ETH <10 71/24/5809    Metabolic Disorder Labs:  Lab Results  Component Value Date   HGBA1C 5.1 07/22/2018   MPG 99.67 07/22/2018   No results found for: PROLACTIN Lab Results  Component Value Date   CHOL 268 (H) 07/22/2018   TRIG 145 07/22/2018   HDL 37 (L) 07/22/2018   CHOLHDL 7.2 07/22/2018   VLDL 29 07/22/2018   LDLCALC 202 (H) 07/22/2018   LDLCALC 147 (H) 11/14/2015    Physical Findings: AIMS: Facial and Oral Movements Muscles of Facial Expression: None,  normal Lips and Perioral Area: None, normal Jaw: None, normal Tongue: None, normal,Extremity Movements Upper (arms, wrists, hands, fingers): None, normal Lower (legs, knees, ankles, toes): None, normal, Trunk Movements Neck, shoulders, hips: None, normal, Overall Severity Severity of abnormal movements (highest score from questions above): None, normal Incapacitation due to abnormal movements: None, normal Patient's awareness of abnormal movements (rate only patient's report): No Awareness, Dental Status Current problems with teeth and/or dentures?: No Does patient usually wear dentures?: No  CIWA:  CIWA-Ar Total: 0 COWS:  COWS Total Score: 0  Musculoskeletal: Strength & Muscle Tone: within normal limits Gait & Station: normal Patient leans: N/A  Psychiatric Specialty Exam: Physical Exam  Nursing note and vitals reviewed. Constitutional: She is oriented to person, place, and time. She appears well-developed and well-nourished.  HENT:  Head: Normocephalic and atraumatic.  Respiratory: Effort normal.  Neurological: She is alert and oriented to person, place, and time.    ROS  Blood pressure (!) 128/92, pulse 76, temperature 97.6 F (36.4 C), resp. rate 20, height 6' (1.829 m), weight 125.6 kg, SpO2 99 %.Body mass index is 37.57 kg/m.  General Appearance: Casual  Eye Contact:  Fair  Speech:  Normal Rate  Volume:  Decreased  Mood:  Depressed  Affect:  Congruent  Thought Process:  Coherent and Descriptions of Associations: Intact  Orientation:  Full (Time, Place, and Person)  Thought Content:  Logical  Suicidal Thoughts:  No  Homicidal Thoughts:  No  Memory:  Immediate;   Fair Recent;   Fair Remote;   Fair  Judgement:  Intact  Insight:  Fair  Psychomotor Activity:  Normal  Concentration:  Concentration: Fair and Attention Span: Fair  Recall:  [employer] of Knowledge:  Fair  Language:  Fair  Akathisia:  Negative  Handed:  Right  AIMS (if indicated):     Assets:   Communication Skills Desire for Improvement Financial Resources/Insurance Housing Physical Health Resilience Social Support Talents/Skills  ADL's:  Intact  Cognition:  WNL  Sleep:  Number of Hours: 6.5     Treatment Plan Summary: Daily contact with patient to assess and evaluate symptoms and progress in treatment, Medication management and Plan : Patient is seen and examined.  Patient is a 52 year old female with the above-stated past psychiatric history seen in follow-up.  #1 major depression versus bipolar depression-she continues on Cymbalta 60 mg p.o. twice daily, Abilify 10 mg p.o. daily for depression augmentation and she is tolerating the Lamictal 25 mg p.o. every afternoon for mood stability without difficulty.  #2 back pain/arthritis-she is doing better with back pain with the standard hospital bed.  No change in the tramadol or Tylenol for her current pain.  #3 history of bilateral DVT-continue Xarelto.  #4 insomnia-she seems to be sleeping better with the doxepin at 75 mg p.o. nightly.  This should also help her mood.  #5 disposition planning-in progress.  Sharma Covert, MD 07/24/2018, 11:14 AM

## 2018-07-25 DIAGNOSIS — G4700 Insomnia, unspecified: Secondary | ICD-10-CM

## 2018-07-25 DIAGNOSIS — M549 Dorsalgia, unspecified: Secondary | ICD-10-CM

## 2018-07-25 DIAGNOSIS — F17210 Nicotine dependence, cigarettes, uncomplicated: Secondary | ICD-10-CM

## 2018-07-25 MED ORDER — TRIAMCINOLONE ACETONIDE 40 MG/ML IJ SUSP
40.0000 mg | Freq: Once | INTRAMUSCULAR | Status: AC
  Administered 2018-07-25: 40 mg via INTRAMUSCULAR
  Filled 2018-07-25: qty 1

## 2018-07-25 MED ORDER — HYDROCODONE-ACETAMINOPHEN 5-325 MG PO TABS
1.0000 | ORAL_TABLET | Freq: Four times a day (QID) | ORAL | Status: DC | PRN
  Administered 2018-07-25: 2 via ORAL
  Filled 2018-07-25: qty 2

## 2018-07-25 NOTE — Progress Notes (Signed)
Patient ID: Janet Hester, female   DOB: August 14, 1966, 52 y.o.   MRN: [id number] D: Patient in dayroom on approach. Pt very angry and irritable accusing staff of going through her belongings. After talking to pt she quickly calmed down stating " I going home tomorrow so I don't want anything to get in my way". Pt reports she is tolerating medication well. Pt denies SI/HI/AVH and pain. Pt attended and participated in evening wrap up group. Cooperative with assessment.  A: Medications administered as prescribed. Support and encouragement provided as needed. R: Patient remains safe and complaint with medications.

## 2018-07-25 NOTE — Progress Notes (Signed)
Adult Psychoeducational Group Note  Date:  07/25/2018 Time:  9:24 PM  Group Topic/Focus:  Wrap-Up Group:   The focus of this group is to help patients review their daily goal of treatment and discuss progress on daily workbooks.  Participation Level:  Active  Participation Quality:  Appropriate  Affect:  Appropriate  Cognitive:  Appropriate  Insight: Appropriate  Engagement in Group:  Engaged  Modes of Intervention:  Discussion  Additional Comments:  Pt stated the medication is working well.  Pt's goal was to find out when discharge will be and pt met that goal.  Pt rated the day at 10/10.  Aleeya Veitch 07/25/2018, 9:24 PM

## 2018-07-25 NOTE — B[facility] Group Notes (Signed)
[place] Group Notes:  (Nursing/MHT/Case Management/Adjunct)  Date:  07/25/2018  Time:  5:51 PM  Type of Therapy:  Nurse Education  Participation Level:  Active  Participation Quality:  Appropriate and Attentive  Affect:  Appropriate  Cognitive:  Alert and Appropriate  Insight:  Good  Engagement in Group:  Engaged  Modes of Intervention:  Discussion and Education  Summary of Progress/Problems: In this group, we identified unhealthy behaviors and their root causes. We discussed unhealthy "stinkin' thinkin'" and its impact on our emotional state and self esteem. We considered ways to boost self-esteem, such as positive self-talk.  Lesli Albee 07/25/2018, 5:51 PM

## 2018-07-25 NOTE — BHH Group Notes (Signed)
[facility] [place] LCSW Group Therapy Note  07/25/2018  10:00-11:00AM  Type of Therapy and Topic:  Group Therapy:  Adding Supports Including Being Your Own Support  Participation Level:  Active   Description of Group:  Patients in this group were introduced to the concept that additional supports including self-support are an essential part of recovery.  A song entitled "I Need Help!" was played and a group discussion was held in reaction to the idea of needing to add supports.  A song entitled "My Own Hero" was played and a group discussion ensued in which patients stated they could relate to the song and it inspired them to realize they have be willing to help themselves in order to succeed, because other people cannot achieve sobriety or stability for them.  We discussed adding a variety of healthy supports to address the various needs in their lives.  A song was played called "I Know Where I've Been" toward the end of group and used to conduct an inspirational wrap-up to group of remembering how far they have already come in their journey.  Therapeutic Goals: 1)  demonstrate the importance of being a part of one's own support system 2)  discuss reasons people in one's life may eventually be unable to be continually supportive  3)  identify the patient's current support system and   4)  elicit commitments to add healthy supports and to become more conscious of being self-supportive   Summary of Patient Progress:  The patient expressed that her 3 daughters, her sisters, brothers, uncle, grandmother and father are all healthy supports for her and that she has no unhealthy supports.  She was quite emphatic in all her statements, saying that if someone is not willing to be supportive she will cut them out of her life.  She was under the impression she could not go to support groups and therapy at the same time because of insurance payments issues, but a great deal of psychoeducation was provided about the  difference between support groups, therapy groups, and 12-step groups which helped to clarify this for her.     Therapeutic Modalities:   Motivational Interviewing Activity  Maretta Los

## 2018-07-25 NOTE — Progress Notes (Signed)
[facility] MD Progress Note  07/25/2018 10:51 AM Janet Hester  MRN:  [id number] Subjective: I am okay.  No changes that I know of.  I am ready to go home.  I only came here to get my medications adjusted, because I can see my regular doctor.  Objective: Patient seen and case discussed today with the MD.  Patient is alert and oriented, however is observed lying in bed during group time.  She is able to report moderate improvement since admission, and remains focused on discharge.  At the time of this evaluation she is able to identify her support system, has completed her discharge planning, as well as suicide safety worksheet.  Upon discharge she plans to communicate more with her loved ones, and talk things through with someone.  She is also able to identify her triggers for depression to include isolation and loneliness.  Her follow-up care will be scheduled with Dr. Adele Schilder upon discharge.  She remains on Abilify 10 mg, Cymbalta 60 mg p.o. twice daily, gabapentin 300 mg p.o. 3 times daily, Lamictal 25 mg.  She also has several as needed medications that she has not requested as of today.  She denied any side effects to her current medications.  She denied any suicidal ideation.     Principal Problem: <principal problem not specified> Diagnosis:   Patient Active Problem List   Diagnosis Date Noted  . Bipolar 1 disorder, depressed, severe ([place]) [F31.4] 07/22/2018  . MDD (major depressive disorder), recurrent severe, without psychosis ([place]) [F33.2] 04/16/2017  . Lichen sclerosus [P71.0] 04/29/2016  . Menopausal disorder [N95.9] 04/25/2016  . History of DVT in adulthood [Z86.718] 04/25/2016  . Depression [F32.9] 04/25/2016  . Hyperlipidemia with target LDL less than 160 [E78.5] 11/14/2015  . Obesity (BMI 35.0-39.9 without comorbidity) [E66.9] 11/14/2015  . Tobacco abuse [Z72.0] 11/14/2015  . Lymphedema of lower extremity [I89.0] 03/22/2015  . Primary hypercoagulable state ([place]) [D68.59] 03/15/2015  .  Routine general medical examination at a health care facility [Z00.00] 03/15/2015  . Visit for screening mammogram [Z12.31] 03/15/2015  . Panic anxiety syndrome [F41.0] 03/15/2015  . Herpes labialis [B00.1] 11/23/2013  . Trochanteric bursitis of right hip [M70.61] 10/27/2012  . DVT, lower extremity, distal ([place]) [I82.4Z9] 05/04/2012   Total Time spent with patient: 20 minutes  Past Psychiatric History: See admission H&P  Past Medical History:  Past Medical History:  Diagnosis Date  . Arthritis   . Bursitis   . Bursitis   . Depression   . DVT, lower extremity, distal ([place]) 05/04/2012   DVT in a branch off of LEFT prox popliteal vein, mid posterior tibial vein, and upper calf of the peroneal vein.   . Shingles   . Thrombus     Past Surgical History:  Procedure Laterality Date  . APPENDECTOMY    . LASER ABLATION     Family History:  Family History  Problem Relation Age of Onset  . Pulmonary embolism Mother   . Cancer Mother 50       cervical cancer  . Deep vein thrombosis Father   . Deep vein thrombosis Paternal Aunt   . Deep vein thrombosis Paternal Uncle   . Deep vein thrombosis Daughter   . Pulmonary embolism Daughter   . Deep vein thrombosis Daughter   . Colon cancer Neg Hx    Family Psychiatric  History: See admission H&P Social History:  Social History   Substance and Sexual Activity  Alcohol Use No  . Alcohol/week: 0.0 standard drinks  Social History   Substance and Sexual Activity  Drug Use No    Social History   Socioeconomic History  . Marital status: Single    Spouse name: Not on file  . Number of children: 3  . Years of education: Not on file  . 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 Financial resource strain: Somewhat hard  . Food insecurity:    Worry: Often true    Inability: Often true  . Transportation needs:    Medical: No    Non-medical:  No  Tobacco Use  . Smoking status: Current Every Day Smoker    Packs/day: 1.00    Years: 20.00    Pack years: 20.00    Types: Cigarettes  . Smokeless tobacco: Never Used  Substance and Sexual Activity  . Alcohol use: No    Alcohol/week: 0.0 standard drinks  . Drug use: No  . Sexual activity: Not Currently  Lifestyle  . Physical activity:    Days per week: 0 days    Minutes per session: 0 min  . Stress: Only a little  Relationships  . Social connections:    Talks on phone: Not on file    Gets together: Not on file    Attends religious service: Never    Active member of club or organization: No    Attends meetings of clubs or organizations: Never    Relationship status: Divorced  Other Topics Concern  . Not on file  Social History Narrative  . Not on file   Additional Social History:    Pain Medications: see MAR Prescriptions: see MAR Over the Counter: see MAR History of alcohol / drug use?: No history of alcohol / drug abuse Longest period of sobriety (when/how long): N/A                    Sleep: Good  Appetite:  Fair  Current Medications: Current Facility-Administered Medications  Medication Dose Route Frequency Provider Last Rate Last Dose  . acetaminophen (TYLENOL) tablet 650 mg  650 mg Oral Q6H PRN Sharma Covert, MD   650 mg at 07/22/18 2300  . ARIPiprazole (ABILIFY) tablet 10 mg  10 mg Oral BH-q7a Rankin, Shuvon B, NP   10 mg at 07/25/18 0747  . doxepin (SINEQUAN) capsule 75 mg  75 mg Oral QHS PRN Sharma Covert, MD   75 mg at 07/24/18 2135  . DULoxetine (CYMBALTA) DR capsule 60 mg  60 mg Oral BID Sharma Covert, MD   60 mg at 07/25/18 0747  . gabapentin (NEURONTIN) capsule 300 mg  300 mg Oral TID Rankin, Shuvon B, NP   300 mg at 07/25/18 0747  . hydrOXYzine (ATARAX/VISTARIL) tablet 25 mg  25 mg Oral Q6H PRN Sharma Covert, MD   25 mg at 07/23/18 2137  . lamoTRIgine (LAMICTAL) tablet 25 mg  25 mg Oral Daily Sharma Covert, MD   25  mg at 07/25/18 0748  . loratadine (CLARITIN) tablet 10 mg  10 mg Oral Daily Rankin, Shuvon B, NP   10 mg at 07/25/18 0748  . magnesium hydroxide (MILK OF MAGNESIA) NICU  ORAL  syringe  15 mL Oral Daily PRN Sharma Covert, MD      . nicotine (NICODERM CQ - dosed in mg/24 hours) patch 21 mg  21 mg Transdermal Daily Sharma Covert, MD   21  mg at 07/25/18 0748  . rivaroxaban (XARELTO) tablet 20 mg  20 mg Oral Q supper Rankin, Shuvon B, NP   20 mg at 07/24/18 1715  . traMADol (ULTRAM) tablet 50 mg  50 mg Oral Q6H PRN Sharma Covert, MD   50 mg at 07/25/18 1660    Lab Results:  No results found for this or any previous visit (from the past 48 hour(s)).  Blood Alcohol level:  Lab Results  Component Value Date   ETH <10 07/22/2018   ETH <10 63/10/6008    Metabolic Disorder Labs: Lab Results  Component Value Date   HGBA1C 5.1 07/22/2018   MPG 99.67 07/22/2018   No results found for: PROLACTIN Lab Results  Component Value Date   CHOL 268 (H) 07/22/2018   TRIG 145 07/22/2018   HDL 37 (L) 07/22/2018   CHOLHDL 7.2 07/22/2018   VLDL 29 07/22/2018   LDLCALC 202 (H) 07/22/2018   LDLCALC 147 (H) 11/14/2015    Physical Findings: AIMS: Facial and Oral Movements Muscles of Facial Expression: None, normal Lips and Perioral Area: None, normal Jaw: None, normal Tongue: None, normal,Extremity Movements Upper (arms, wrists, hands, fingers): None, normal Lower (legs, knees, ankles, toes): None, normal, Trunk Movements Neck, shoulders, hips: None, normal, Overall Severity Severity of abnormal movements (highest score from questions above): None, normal Incapacitation due to abnormal movements: None, normal Patient's awareness of abnormal movements (rate only patient's report): No Awareness, Dental Status Current problems with teeth and/or dentures?: No Does patient usually wear dentures?: No  CIWA:  CIWA-Ar Total: 0 COWS:  COWS Total Score: 0  Musculoskeletal: Strength & Muscle  Tone: within normal limits Gait & Station: normal Patient leans: N/A  Psychiatric Specialty Exam: Physical Exam  Nursing note and vitals reviewed. Constitutional: She is oriented to person, place, and time. She appears well-developed and well-nourished.  HENT:  Head: Normocephalic and atraumatic.  Respiratory: Effort normal.  Neurological: She is alert and oriented to person, place, and time.    ROS   Blood pressure 114/87, pulse 89, temperature 98.2 F (36.8 C), temperature source Oral, resp. rate 16, height 6' (1.829 m), weight 125.6 kg, SpO2 99 %.Body mass index is 37.57 kg/m.  General Appearance: Fairly Groomed  Eye Contact:  Good  Speech:  Clear and Coherent  Volume:  Increased  Mood:  Irritable  Affect:  Blunt and Congruent  Thought Process:  Goal Directed, Linear and Descriptions of Associations: Circumstantial  Orientation:  Full (Time, Place, and Person)  Thought Content:  WDL  Suicidal Thoughts:  Denies and contracts for safety  Homicidal Thoughts:  No  Memory:  Immediate;   Good Recent;   Good Remote;   Good  Judgement:  Fair  Insight:  Present  Psychomotor Activity:  Normal  Concentration:  Concentration: Good and Attention Span: Good  Recall:  Good  Fund of Knowledge:  Good  Language:  Good  Akathisia:  No  Handed:  Right  AIMS (if indicated):     Assets:  Communication Skills Desire for Improvement Financial Resources/Insurance [place] Talents/Skills  ADL's:  Intact  Cognition:  WNL  Sleep:  Number of Hours: 5.25     Treatment Plan Summary: As of today no additional changes to be made 07/25/2018.  Patient medication will remain the same.  Please see below, discharge planning in process. Daily contact with patient to assess and evaluate symptoms and progress in treatment, Medication management and Plan : Patient is seen and examined.  Patient is a 52 year old female with the above-stated past psychiatric  history seen in follow-up.  #1 major depression versus bipolar depression-she continues on Cymbalta 60 mg p.o. twice daily, Abilify 10 mg p.o. daily for depression augmentation and she is tolerating the Lamictal 25 mg p.o. every afternoon for mood stability without difficulty.  #2 back pain/arthritis-she is doing better with back pain with the standard hospital bed.  No change in the tramadol or Tylenol for her current pain.  #3 history of bilateral DVT-continue Xarelto.  #4 insomnia-she seems to be sleeping better with the doxepin at 75 mg p.o. nightly.  This should also help her mood.  #5 disposition planning-in progress.  Suella Broad, FNP 07/25/2018, 10:51 AM

## 2018-07-25 NOTE — Plan of Care (Signed)
D: Patient presents depressed, flat. She was requesting discharge, and was under the impression that since she had been admitted for 72 hours that she would be discharging soon. RN provided clarification. Her sleep was fair, and she did not request medication to help. Her appetite is good, energy normal and concentration good. She rates her depression 2/10 and feeling of hopelessness and anxiety 0/10. She complains of chronic bursitis pain in hips 9/10. Patient denies SI/HI/AVH.  A: Patient checked q15 min, and checks reviewed. Reviewed medication changes with patient and educated on side effects. Educated patient on importance of attending group therapy sessions and educated on several coping skills. Encouarged participation in milieu through recreation therapy and attending meals with peers. Support and encouragement provided. Fluids offered. R: Patient receptive to education on medications, and is medication compliant. Patient contracts for safety on the unit. Goal: "Going home" and "Talk to doctor and Education officer, museum."

## 2018-07-26 MED ORDER — DULOXETINE HCL 60 MG PO CPEP: 60.0000 mg | ORAL_CAPSULE | Freq: Two times a day (BID) | ORAL | 0 refills | Status: DC

## 2018-07-26 MED ORDER — GABAPENTIN 300 MG PO CAPS: 300.0000 mg | ORAL_CAPSULE | Freq: Three times a day (TID) | ORAL | 0 refills | Status: DC

## 2018-07-26 MED ORDER — DOXEPIN HCL 75 MG PO CAPS: 75.0000 mg | ORAL_CAPSULE | Freq: Every evening | ORAL | 0 refills | Status: DC | PRN

## 2018-07-26 MED ORDER — ARIPIPRAZOLE 10 MG PO TABS: 10.0000 mg | ORAL_TABLET | ORAL | 0 refills | Status: DC

## 2018-07-26 MED ORDER — HYDROXYZINE HCL 25 MG PO TABS: 25.0000 mg | ORAL_TABLET | Freq: Four times a day (QID) | ORAL | 0 refills | Status: DC | PRN

## 2018-07-26 MED ORDER — LAMOTRIGINE 25 MG PO TABS: 25.0000 mg | ORAL_TABLET | Freq: Every day | ORAL | 0 refills | Status: DC

## 2018-07-26 NOTE — Progress Notes (Signed)
Nursing Progress Note [phone]  Data: Patient presents anxious for discharge. Patient complaint with scheduled medications. Patient denies pain/physical complaints. Patient completed self-inventory sheet and rates depression, hopelessness, and anxiety 0, 0, and 2 respectively. Patient rates their sleep and appetite as good and good respectively. Patient states goal for today is to go home. Patient is seen attending groups and visible in the milieu. Patient currently denies SI/HI/AVH.   Action: Patient is educated about and provided medication per provider's orders. Patient safety maintained with q15 min safety checks and frequent rounding. Low fall risk precautions in place. Emotional support given. 1:1 interaction and active listening provided. Patient encouraged to attend meals, groups, and work on treatment plan and goals. Labs, vital signs and patient behavior monitored throughout shift.   Response: Patient remains safe on the unit at this time and agrees to come to staff with any issues/concerns. Patient is interacting with peers appropriately on the unit. Will continue to support and monitor.

## 2018-07-26 NOTE — Progress Notes (Signed)
  [facility] Adult Case Management Discharge Plan :  Will you be returning to the same living situation after discharge:  Yes,  patient reports she is returning home with her adult daughter At discharge, do you have transportation home?: Yes,  patient reports her daughter will pick her up at discharge Do you have the ability to pay for your medications: Yes,  BCBS, SSDI, support from family  Release of information consent forms completed and in the chart;  Patient's signature needed at discharge.  Patient to Follow up at: Follow-up Information    [facility] [place]. Go on 09/02/2018.   Specialty:  Behavioral Health Why:  Appointment for medication management with Dr. Adele Schilder is Thursday, 09/02/18 at 2:00pm. Please be sure to bring any discharge paperwork from this hospitalization.  Contact information: [place] [phone] [place] [phone]          Next level of care provider has access to [place] and Suicide Prevention discussed: Yes,  with the patient's daughter  Have you used any form of tobacco in the last 30 days? (Cigarettes, Smokeless Tobacco, Cigars, and/or Pipes): Yes  Has patient been referred to the Quitline?: Patient refused referral  Patient has been referred for addiction treatment: N/A  Marylee Floras, LCSWA 07/26/2018, 10:14 AM

## 2018-07-26 NOTE — Progress Notes (Signed)
Patient ID: Janet Hester, female   DOB: [birth year], 52 y.o.   MRN: [id number]  Discharge Note  D) Patient discharged to lobby. Patient states readiness for discharge. Patient denies SI/HI, AVH and is not delusional or psychotic. Patient in no acute distress. Patient has completed their Suicide Safety Plan. Patient provided an opportunity to complete and return Patient Satisfaction Survey.   A) Written and verbal discharge instructions given to the patient. Patient accepting to information and verbalized understanding. Patient agrees to the discharge plan. Opportunity for questions and concerns presented to patient. Patient denied any further questions or concerns. All belongings returned to patient. Patient signed for return of belongings and discharge paperwork. Patient provided a copy of their Suicide Safety Plan and has been provided a copy of the Patient Satisfaction Survey with return instructions.  R) Patient safely escorted to the lobby. Patient discharged from [facility] with prescriptions, personal belongings, follow-up appointment in place and discharge paperwork.

## 2018-07-26 NOTE — BHH Suicide Risk Assessment (Signed)
[facility] Discharge Suicide Risk Assessment   Principal Problem: <principal problem not specified> Discharge Diagnoses:  Patient Active Problem List   Diagnosis Date Noted  . Bipolar 1 disorder, depressed, severe ([place]) [F31.4] 07/22/2018  . MDD (major depressive disorder), recurrent severe, without psychosis ([place]) [F33.2] 04/16/2017  . Lichen sclerosus [E07.1] 04/29/2016  . Menopausal disorder [N95.9] 04/25/2016  . History of DVT in adulthood [Z86.718] 04/25/2016  . Depression [F32.9] 04/25/2016  . Hyperlipidemia with target LDL less than 160 [E78.5] 11/14/2015  . Obesity (BMI 35.0-39.9 without comorbidity) [E66.9] 11/14/2015  . Tobacco abuse [Z72.0] 11/14/2015  . Lymphedema of lower extremity [I89.0] 03/22/2015  . Primary hypercoagulable state ([place]) [D68.59] 03/15/2015  . Routine general medical examination at a health care facility [Z00.00] 03/15/2015  . Visit for screening mammogram [Z12.31] 03/15/2015  . Panic anxiety syndrome [F41.0] 03/15/2015  . Herpes labialis [B00.1] 11/23/2013  . Trochanteric bursitis of right hip [M70.61] 10/27/2012  . DVT, lower extremity, distal ([place]) [I82.4Z9] 05/04/2012    Total Time spent with patient: 15 minutes  Musculoskeletal: Strength & Muscle Tone: within normal limits Gait & Station: normal Patient leans: N/A  Psychiatric Specialty Exam: Review of Systems  All other systems reviewed and are negative.   Blood pressure (!) 149/94, pulse 92, temperature (!) 97.2 F (36.2 C), temperature source Oral, resp. rate 20, height 6' (1.829 m), weight 125.6 kg, SpO2 99 %.Body mass index is 37.57 kg/m.  General Appearance: Casual  Eye Contact::  Good  Speech:  Normal Rate409  Volume:  Normal  Mood:  Euthymic  Affect:  Congruent  Thought Process:  Coherent and Descriptions of Associations: Intact  Orientation:  Full (Time, Place, and Person)  Thought Content:  Logical  Suicidal Thoughts:  No  Homicidal Thoughts:  No  Memory:  Immediate;    Fair Recent;   Fair Remote;   Fair  Judgement:  Intact  Insight:  Fair  Psychomotor Activity:  Normal  Concentration:  Good  Recall:  Good  Fund of Knowledge:Good  Language: Good  Akathisia:  Negative  Handed:  Right  AIMS (if indicated):     Assets:  Communication Skills Desire for Improvement Financial Resources/Insurance Housing Physical Health Resilience Social Support Talents/Skills  Sleep:  Number of Hours: 6.75  Cognition: WNL  ADL's:  Intact   Mental Status Per Nursing Assessment::   On Admission:  Suicidal ideation indicated by patient, Self-harm thoughts  Demographic Factors:  Low socioeconomic status  Loss Factors: NA  Historical Factors: Impulsivity  Risk Reduction Factors:   Sense of responsibility to family, Employed, Living with another person, especially a relative, Positive social support and Positive therapeutic relationship  Continued Clinical Symptoms:  Bipolar Disorder:   Depressive phase  Cognitive Features That Contribute To Risk:  None    Suicide Risk:  Minimal: No identifiable suicidal ideation.  Patients presenting with no risk factors but with morbid ruminations; may be classified as minimal risk based on the severity of the depressive symptoms    Plan Of Care/Follow-up recommendations:  Activity:  ad lib  Sharma Covert, MD 07/26/2018, 8:00 AM

## 2018-07-26 NOTE — Progress Notes (Signed)
Recreation Therapy Notes  Date: 10.14.19 Time: 0930 Location: [address] Dayroom  Group Topic: Stress Management  Goal Area(s) Addresses:  Patient will verbalize importance of using healthy stress management.  Patient will identify positive emotions associated with healthy stress management.  Intervention: Stress Management  Activity :  Guided Imagery.  LRT introduced patients to the stress management technique of guided imagery.    Education:  Stress Management, Discharge Planning.   Education Outcome: Acknowledges edcuation/In group clarification offered/Needs additional education  Clinical Observations/Feedback: Pt did not attend group.    Victorino Sparrow, LRT/CTRS         Victorino Sparrow A 07/26/2018 12:47 PM

## 2018-07-26 NOTE — Discharge Summary (Signed)
Physician Discharge Summary Note  Patient:  Janet Hester is an 52 y.o., female MRN:  [id number] DOB:  1966/03/01 Patient phone:  [phone] (home)  Patient address:   [address] [place] [address],  Total Time spent with patient: 30 minutes  Date of Admission:  07/22/2018 Date of Discharge: 07/26/2018  Reason for Admission:  Increase in depressive, unstable  Principal Problem: MDD (major depressive disorder), recurrent severe, without psychosis [facility]) Discharge Diagnoses: Patient Active Problem List   Diagnosis Date Noted  . MDD (major depressive disorder), recurrent severe, without psychosis ([place]) [F33.2] 04/16/2017    Priority: High  . Bipolar 1 disorder, depressed, severe ([place]) [F31.4] 07/22/2018  . Lichen sclerosus [T73.2] 04/29/2016  . Menopausal disorder [N95.9] 04/25/2016  . History of DVT in adulthood [Z86.718] 04/25/2016  . Depression [F32.9] 04/25/2016  . Hyperlipidemia with target LDL less than 160 [E78.5] 11/14/2015  . Obesity (BMI 35.0-39.9 without comorbidity) [E66.9] 11/14/2015  . Tobacco abuse [Z72.0] 11/14/2015  . Lymphedema of lower extremity [I89.0] 03/22/2015  . Primary hypercoagulable state ([place]) [D68.59] 03/15/2015  . Routine general medical examination at a health care facility [Z00.00] 03/15/2015  . Visit for screening mammogram [Z12.31] 03/15/2015  . Panic anxiety syndrome [F41.0] 03/15/2015  . Herpes labialis [B00.1] 11/23/2013  . Trochanteric bursitis of right hip [M70.61] 10/27/2012  . DVT, lower extremity, distal ([place]) [I82.4Z9] 05/04/2012    Past Psychiatric History: bipolar discharge  Past Medical History:  Past Medical History:  Diagnosis Date  . Arthritis   . Bursitis   . Bursitis   . Depression   . DVT, lower extremity, distal ([place]) 05/04/2012   DVT in a branch off of LEFT prox popliteal vein, mid posterior tibial vein, and upper calf of the peroneal vein.   . Shingles   . Thrombus     Past Surgical History:   Procedure Laterality Date  . APPENDECTOMY    . LASER ABLATION     Family History:  Family History  Problem Relation Age of Onset  . Pulmonary embolism Mother   . Cancer Mother 21       cervical cancer  . Deep vein thrombosis Father   . Deep vein thrombosis Paternal Aunt   . Deep vein thrombosis Paternal Uncle   . Deep vein thrombosis Daughter   . Pulmonary embolism Daughter   . Deep vein thrombosis Daughter   . Colon cancer Neg Hx    Family Psychiatric  History: none Social History:  Social History   Substance and Sexual Activity  Alcohol Use No  . Alcohol/week: 0.0 standard drinks     Social History   Substance and Sexual Activity  Drug Use No    Social History   Socioeconomic History  . Marital status: Single    Spouse name: Not on file  . Number of children: 3  . Years of education: Not on file  . 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 Financial resource strain: Somewhat hard  . Food insecurity:    Worry: Often true    Inability: Often true  . Transportation needs:    Medical: No    Non-medical: No  Tobacco Use  . Smoking status: Current Every Day Smoker    Packs/day: 1.00    Years: 20.00    Pack years: 20.00    Types: Cigarettes  . Smokeless tobacco: Never Used  Substance and Sexual  Activity  . Alcohol use: No    Alcohol/week: 0.0 standard drinks  . Drug use: No  . Sexual activity: Not Currently  Lifestyle  . Physical activity:    Days per week: 0 days    Minutes per session: 0 min  . Stress: Only a little  Relationships  . Social connections:    Talks on phone: Not on file    Gets together: Not on file    Attends religious service: Never    Active member of club or organization: No    Attends meetings of clubs or organizations: Never    Relationship status: Divorced  Other Topics Concern  . Not on file  Social History Narrative  . Not on file     Hospital Course:  On admission 07/22/2018: Patient is a 52 year old female with a reported past psychiatric history significant for major depression versus bipolar disorder; most recently depressed. The patient's daughter contacted her outpatient psychiatrist today. They were wishing to be seen because of worsening depressive symptoms. She was unable to be able to get an appointment but did speak with the psychiatrist office, and recommended coming in being evaluated. She was evaluated as a walk-in that the behavioral health hospital. The patient stated that she has been spiraling downwards for several weeks. She had previously been treated with Depakote, but that it been stopped secondary to weight gain. She had been switched to Topamax on 05/29/2018. She stated that she got dry mouth, and other side effects. She had worsening depression. She admitted to helplessness, hopelessness and worthlessness. She admitted to suicidal ideation. She also is currently taking Abilify 10 mg p.o. every morning and Cymbalta either 60 mg twice a day or 120 mg once a day. The patient stated that her last psychiatric hospitalization was at this facility in July 2018. At that time she had severe depression, suicidal ideation, was feeling sadness, tearful, isolated, withdrawn, irritable and having disturbed sleep. She had previously been treated for depression by her primary care provider who would previously prescribed Paxil as well as Wellbutrin XL. Her psychiatric history goes back to age 43, and she is had 2 previous psychiatric admissions besides the current one. At the end of the July hospitalization she was discharged on Abilify, clonazepam, Cymbalta, gabapentin, hydroxyzine and trazodone. The patient stated that the trazodone was not working and she had stopped that previously. She was admitted to the hospital for evaluation and stabilization.  Medications:  Cymbalta changed from 120 mg daily to 60 mg  BID for depression, Lamictal 25 mg daily started for mood stabilization, continue hydroxyzine 25 mg daily PRN anxiety, continue Abilify 10 mg daily for mood stabilization, start Doxepin 25 mg at bedtime PRN insomnia  07/23/2018:  Patient is seen and examined.  Patient is a 52 year old female with a past psychiatric history significant for major depression versus bipolar disorder; most recently depressed.  She is seen in follow-up.  She is unhappy this a.m.  She stated that the standard psychiatric hospital bed was very uncomfortable and she had significant pain last night.  She did not sleep well.  She stated that the doxepin did sedate her, she denied any side effects from it, but stated that she felt like the dosage needed to be increased.  She tolerated her first dose of Lamictal without problem.  Her mood and anxiety are essentially unchanged.  We discussed potentially increasing her doxepin, the rationale behind changing her Cymbalta to twice a day, potential for rash with the Lamictal,  and obtaining a standard hospital bed to alleviate some of her discomfort.  Medications:  Doxepin 25 mg to 75 mg at bedtime for sleep PRN  07/24/2018: Subjective:  Patient is seen and examined. Patient is a 52 year old female with a reported past psychiatric history significant for major depression versus bipolar disorder; most recently depressed.  Patient states she is feeling a bit better today.  She stated that her depression has decreased.  She states she slept better on the hospital bed.  She stated her pain was stable.  She denied any manic symptoms.  She denied any side effects to her current medications.  She denied any suicidal ideation.  Blood pressure is well controlled.  Heart rate is 76.  07/25/2018:  I am okay.  No changes that I know of.  I am ready to go home.  I only came here to get my medications adjusted, because I can see my regular doctor.  Objective: Patient seen and case discussed today  with the MD.  Patient is alert and oriented, however is observed lying in bed during group time.  She is able to report moderate improvement since admission, and remains focused on discharge.  At the time of this evaluation she is able to identify her support system, has completed her discharge planning, as well as suicide safety worksheet.  Upon discharge she plans to communicate more with her loved ones, and talk things through with someone.  She is also able to identify her triggers for depression to include isolation and loneliness.  Her follow-up care will be scheduled with Dr. Adele Schilder upon discharge.  She remains on Abilify 10 mg, Cymbalta 60 mg p.o. twice daily, gabapentin 300 mg p.o. 3 times daily, Lamictal 25 mg.  She also has several as needed medications that she has not requested as of today.  She denied any side effects to her current medications.  She denied any suicidal ideation.    Medications:  No changes  07/26/2018:  Patient has met maximum benefit from hospitalization.  No suicidal/homicidal ideations, hallucinations, or substance abuse.  Patient was provided with discharge instructions and explanations along with behavioral health appointment, Rx, and crisis numbers.  Stable for discharge.  Physical Findings: AIMS: Facial and Oral Movements Muscles of Facial Expression: None, normal Lips and Perioral Area: None, normal Jaw: None, normal Tongue: None, normal,Extremity Movements Upper (arms, wrists, hands, fingers): None, normal Lower (legs, knees, ankles, toes): None, normal, Trunk Movements Neck, shoulders, hips: None, normal, Overall Severity Severity of abnormal movements (highest score from questions above): None, normal Incapacitation due to abnormal movements: None, normal Patient's awareness of abnormal movements (rate only patient's report): No Awareness, Dental Status Current problems with teeth and/or dentures?: No Does patient usually wear dentures?: No  CIWA:   CIWA-Ar Total: 0 COWS:  COWS Total Score: 0  Musculoskeletal: Strength & Muscle Tone: within normal limits Gait & Station: normal Patient leans: N/A  Psychiatric Specialty Exam: Review of Systems  All other systems reviewed and are negative.   Blood pressure (!) 149/94, pulse 92, temperature (!) 97.2 F (36.2 C), temperature source Oral, resp. rate 20, height 6' (1.829 m), weight 125.6 kg, SpO2 99 %.Body mass index is 37.57 kg/m.  General Appearance: Casual  Eye Contact::  Good  Speech:  Normal Rate409  Volume:  Normal  Mood:  Euthymic  Affect:  Congruent  Thought Process:  Coherent and Descriptions of Associations: Intact  Orientation:  Full (Time, Place, and Person)  Thought Content:  Logical  Suicidal Thoughts:  No  Homicidal Thoughts:  No  Memory:  Immediate;   Fair Recent;   Fair Remote;   Fair  Judgement:  Intact  Insight:  Fair  Psychomotor Activity:  Normal  Concentration:  Good  Recall:  Good  Fund of Knowledge:Good  Language: Good  Akathisia:  Negative  Handed:  Right  AIMS (if indicated):     Assets:  Communication Skills Desire for Improvement Financial Resources/Insurance Housing Physical Health Resilience Social Support Talents/Skills  Sleep:  Number of Hours: 6.75  Cognition: WNL  ADL's:  Intact     Have you used any form of tobacco in the last 30 days? (Cigarettes, Smokeless Tobacco, Cigars, and/or Pipes): Yes  Has this patient used any form of tobacco in the last 30 days? (Cigarettes, Smokeless Tobacco, Cigars, and/or Pipes)  Yes, A prescription for an FDA-approved tobacco cessation medication was offered at discharge and the patient refused  Blood Alcohol level:  Lab Results  Component Value Date   [facility] <10 07/22/2018   ETH <10 03/50/0938    Metabolic Disorder Labs:  Lab Results  Component Value Date   HGBA1C 5.1 07/22/2018   MPG 99.67 07/22/2018   No results found for: PROLACTIN Lab Results  Component Value Date   CHOL 268  (H) 07/22/2018   TRIG 145 07/22/2018   HDL 37 (L) 07/22/2018   CHOLHDL 7.2 07/22/2018   VLDL 29 07/22/2018   LDLCALC 202 (H) 07/22/2018   LDLCALC 147 (H) 11/14/2015    See Psychiatric Specialty Exam and Suicide Risk Assessment completed by Attending Physician prior to discharge.  Discharge destination:  Home  Is patient on multiple antipsychotic therapies at discharge:  No   Has Patient had three or more failed trials of antipsychotic monotherapy by history:  No  Recommended Plan for Multiple Antipsychotic Therapies: NA  Discharge Instructions    Diet - low sodium heart healthy   Complete by:  As directed    Discharge instructions   Complete by:  As directed    Follow up with appointment this week   Increase activity slowly   Complete by:  As directed      Allergies as of 07/26/2018      Reactions   Shellfish-derived Products Anaphylaxis, Hives, Swelling      Medication List    STOP taking these medications   ARIPiprazole 10 MG tablet Commonly known as:  ABILIFY   calcium carbonate 750 MG chewable tablet Commonly known as:  TUMS EX   EPINEPHrine 0.3 mg/0.3 mL Soaj injection Commonly known as:  EPI-PEN   gabapentin 300 MG capsule Commonly known as:  NEURONTIN   ibuprofen 600 MG tablet Commonly known as:  ADVIL,MOTRIN   methocarbamol 500 MG tablet Commonly known as:  ROBAXIN   topiramate 50 MG tablet Commonly known as:  TOPAMAX     TAKE these medications     Indication  cetirizine 10 MG tablet Commonly known as:  ZYRTEC Take 10 mg by mouth daily as needed for allergies.  Indication:  allergies   doxepin 75 MG capsule Commonly known as:  SINEQUAN Take 1 capsule (75 mg total) by mouth at bedtime as needed (insomnia).  Indication:  sleep   DULoxetine 60 MG capsule Commonly known as:  CYMBALTA Take 1 capsule (60 mg total) by mouth 2 (two) times daily. What changed:    how much to take  when to take this  Indication:  Major Depressive Disorder    hydrOXYzine 25 MG tablet Commonly  known as:  ATARAX/VISTARIL Take 1 tablet (25 mg total) by mouth every 6 (six) hours as needed for anxiety. What changed:  when to take this  Indication:  Feeling Anxious   lamoTRIgine 25 MG tablet Commonly known as:  LAMICTAL Take 1 tablet (25 mg total) by mouth daily. Start taking on:  07/27/2018  Indication:  mood stabilization   rivaroxaban 20 MG Tabs tablet Commonly known as:  XARELTO Take 1 tablet (20 mg total) by mouth daily with supper.  Indication:  Blood Clot in a Deep Vein      Follow-up Information    [facility] [place]. Go on 09/02/2018.   Specialty:  Behavioral Health Why:  Appointment for medication management with Dr. Adele Schilder is Thursday, 09/02/18 at 2:00pm. Please be sure to bring any discharge paperwork from this hospitalization.  Contact information: [place] [id number] [place] [phone]          Follow-up recommendations:  Activity:  as tolerated Diet:  heart healthy diet  Comments:  Follow up with outpatient appointment on 09/02/2018  Signed: Waylan Boga, NP 07/26/2018, 11:06 AM

## 2018-07-26 NOTE — Plan of Care (Signed)
  Problem: Education: Goal: Emotional status will improve Outcome: Progressing Note:  Pt. Remains anxious for discharge, but remains cooperative.    Problem: Coping: Goal: Ability to verbalize frustrations and anger appropriately will improve Outcome: Progressing Note:  Pt has not gotten irritable despite having to wait longer than she's prefer for discharge.

## 2018-08-04 ENCOUNTER — Ambulatory Visit (INDEPENDENT_AMBULATORY_CARE_PROVIDER_SITE_OTHER): Payer: BLUE CROSS/BLUE SHIELD | Admitting: Licensed Clinical Social Worker

## 2018-08-04 DIAGNOSIS — F3130 Bipolar disorder, current episode depressed, mild or moderate severity, unspecified: Secondary | ICD-10-CM

## 2018-08-04 NOTE — Progress Notes (Signed)
THERAPIST PROGRESS NOTE  Session Time: 12:05pm-12:45pm  Participation Level: Active  Behavioral Response: CasualDrowsyEuthymic; flat affect throughout most of session  Type of Therapy: Individual Therapy  Treatment Goals addressed: Coping and Diagnosis: Bipolar  Interventions: Motivational Interviewing and Supportive  Summary: Janet Hester is a 52 y.o. female who presents as a hospital follow up referred for consideration for BHIOP. Client reports being recently hospitalized due to increase in depressive symtpoms and suicidal thoughts. Client reports she was unable to meet with primary psychiatrist and due to severity of symptoms family took client to [facility]. Client reports in the hospital her medication was changed and she feels her mood as become more stable. Of note, client reports since starting higher doses of medications she does feel drowsy and an increased feeling of tiredness.  Client reports her daughters are checking up on her more often which she both appreciates and feels it is happening 'too much.' Client completes updated outpatient treatment plan and agrees to be seen monthly. Client reports she has been on disability for about a year due to the stress of previous jobs but is scheduled with [facility] in order to try and gain at least part time work and have a sense of purpose and something to do during the day.  Suicidal/Homicidal: Nowithout intent/plan  Therapist Response: Client met with client assessing for SI/HI/psychosis and inquired about medication compliance. Clinician provided information of PHP, IOP, and outpatient levels of care. Clinician inquired about stressors prior to hospitalization, which client could not identify other than feeling as if her medication was not working. Clinician inquired about current support system desired treatment goals. Clinician and client discussed setting boundaries with daughters. Clinician and client created treatment  plan for outpatient therapy services.  Plan: Return again in 4 weeks.  Diagnosis: Axis I: Bipolar, Depressed     Olegario Messier, LCSW 08/04/2018

## 2018-08-05 ENCOUNTER — Other Ambulatory Visit: Payer: Self-pay | Admitting: Nurse Practitioner

## 2018-08-05 DIAGNOSIS — N63 Unspecified lump in unspecified breast: Secondary | ICD-10-CM

## 2018-08-13 ENCOUNTER — Ambulatory Visit
Admission: RE | Admit: 2018-08-13 | Discharge: 2018-08-13 | Disposition: A | Discharge status: Home / Self Care | Payer: BLUE CROSS/BLUE SHIELD | Source: Ambulatory Visit | Attending: Nurse Practitioner | Admitting: Nurse Practitioner

## 2018-08-13 DIAGNOSIS — N63 Unspecified lump in unspecified breast: Secondary | ICD-10-CM

## 2018-08-13 LAB — HM MAMMOGRAPHY

## 2018-08-19 ENCOUNTER — Ambulatory Visit (INDEPENDENT_AMBULATORY_CARE_PROVIDER_SITE_OTHER): Payer: BLUE CROSS/BLUE SHIELD | Admitting: Psychiatry

## 2018-08-19 ENCOUNTER — Encounter (HOSPITAL_COMMUNITY): Payer: Self-pay | Admitting: Psychiatry

## 2018-08-19 ENCOUNTER — Encounter (HOSPITAL_COMMUNITY): Payer: Self-pay

## 2018-08-19 VITALS — BP 140/90 | Ht 72.0 in | Wt 285.2 lb

## 2018-08-19 DIAGNOSIS — F411 Generalized anxiety disorder: Secondary | ICD-10-CM

## 2018-08-19 DIAGNOSIS — F332 Major depressive disorder, recurrent severe without psychotic features: Secondary | ICD-10-CM

## 2018-08-19 DIAGNOSIS — F3130 Bipolar disorder, current episode depressed, mild or moderate severity, unspecified: Secondary | ICD-10-CM

## 2018-08-19 MED ORDER — DOXEPIN HCL 75 MG PO CAPS: 75.0000 mg | ORAL_CAPSULE | Freq: Every evening | ORAL | 1 refills | Status: DC | PRN

## 2018-08-19 MED ORDER — LURASIDONE HCL 40 MG PO TABS: 40.0000 mg | ORAL_TABLET | Freq: Every day | ORAL | 1 refills | Status: DC

## 2018-08-19 MED ORDER — DULOXETINE HCL 60 MG PO CPEP: 60.0000 mg | ORAL_CAPSULE | Freq: Two times a day (BID) | ORAL | 1 refills | Status: DC

## 2018-08-19 MED ORDER — LAMOTRIGINE 25 MG PO TABS: ORAL_TABLET | ORAL | 1 refills | Status: DC
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
## 2018-08-31 ENCOUNTER — Encounter

## 2018-08-31 ENCOUNTER — Ambulatory Visit (HOSPITAL_COMMUNITY): Payer: Self-pay | Admitting: Psychiatry

## 2018-09-01 ENCOUNTER — Ambulatory Visit (HOSPITAL_COMMUNITY): Payer: BLUE CROSS/BLUE SHIELD | Admitting: Licensed Clinical Social Worker

## 2018-09-02 ENCOUNTER — Ambulatory Visit (HOSPITAL_COMMUNITY): Payer: Self-pay | Admitting: Psychiatry

## 2018-09-30 ENCOUNTER — Encounter (HOSPITAL_COMMUNITY): Payer: Self-pay | Admitting: Psychiatry

## 2018-09-30 ENCOUNTER — Ambulatory Visit (INDEPENDENT_AMBULATORY_CARE_PROVIDER_SITE_OTHER): Payer: BLUE CROSS/BLUE SHIELD | Admitting: Psychiatry

## 2018-09-30 VITALS — BP 120/78 | HR 85 | Ht 71.5 in | Wt 284.0 lb

## 2018-09-30 DIAGNOSIS — F3130 Bipolar disorder, current episode depressed, mild or moderate severity, unspecified: Secondary | ICD-10-CM | POA: Diagnosis not present

## 2018-09-30 DIAGNOSIS — F411 Generalized anxiety disorder: Secondary | ICD-10-CM | POA: Diagnosis not present

## 2018-09-30 DIAGNOSIS — F332 Major depressive disorder, recurrent severe without psychotic features: Secondary | ICD-10-CM

## 2018-09-30 DIAGNOSIS — F319 Bipolar disorder, unspecified: Secondary | ICD-10-CM

## 2018-09-30 MED ORDER — DULOXETINE HCL 60 MG PO CPEP: 60.0000 mg | ORAL_CAPSULE | Freq: Two times a day (BID) | ORAL | 2 refills | Status: DC

## 2018-09-30 MED ORDER — LAMOTRIGINE 100 MG PO TABS: 100.0000 mg | ORAL_TABLET | Freq: Every day | ORAL | 0 refills | Status: DC

## 2018-09-30 MED ORDER — DOXEPIN HCL 50 MG PO CAPS: 50.0000 mg | ORAL_CAPSULE | Freq: Every evening | ORAL | 0 refills | Status: DC | PRN
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
## 2018-12-13 ENCOUNTER — Telehealth: Payer: Self-pay | Admitting: Family Medicine

## 2018-12-13 NOTE — Telephone Encounter (Signed)
Patient is requesting to transfer her care from Dr. Scarlette Calico at [facility] to Dr. Letta Median at [facility] due to location (see message below).  Dr. Ronnald Ramp, is this okay with you? Dr. Bryan Lemma, is this okay with you? Please advise.

## 2018-12-13 NOTE — Telephone Encounter (Signed)
Copied from [place] [id number]. Topic: Appointment Scheduling - Transfer of Care >> Dec 13, 2018 10:34 AM Rutherford Nail, NT wrote: Pt is requesting to transfer FROM: Dr Ronnald Ramp at [facility] Pt is requesting to transfer TO: Dr Bryan Lemma - [facility] Reason for requested transfer: [facility] is close to her house.  Send CRM to patient's current PCP (transferring FROM).

## 2018-12-13 NOTE — Telephone Encounter (Signed)
Yes  this is ok with me  TJ

## 2018-12-14 NOTE — Telephone Encounter (Signed)
Can you help to schedule this patient?

## 2018-12-14 NOTE — Telephone Encounter (Signed)
Done

## 2018-12-14 NOTE — Telephone Encounter (Signed)
Ok

## 2018-12-22 ENCOUNTER — Encounter: Payer: BLUE CROSS/BLUE SHIELD | Admitting: Family Medicine

## 2019-01-03 ENCOUNTER — Ambulatory Visit (INDEPENDENT_AMBULATORY_CARE_PROVIDER_SITE_OTHER): Payer: BLUE CROSS/BLUE SHIELD | Admitting: Psychiatry

## 2019-01-03 ENCOUNTER — Encounter (HOSPITAL_COMMUNITY): Payer: Self-pay | Admitting: Psychiatry

## 2019-01-03 ENCOUNTER — Other Ambulatory Visit: Payer: Self-pay

## 2019-01-03 DIAGNOSIS — F411 Generalized anxiety disorder: Secondary | ICD-10-CM | POA: Diagnosis not present

## 2019-01-03 DIAGNOSIS — F332 Major depressive disorder, recurrent severe without psychotic features: Secondary | ICD-10-CM | POA: Diagnosis not present

## 2019-01-03 DIAGNOSIS — F319 Bipolar disorder, unspecified: Secondary | ICD-10-CM | POA: Diagnosis not present

## 2019-01-03 DIAGNOSIS — F3130 Bipolar disorder, current episode depressed, mild or moderate severity, unspecified: Secondary | ICD-10-CM | POA: Diagnosis not present

## 2019-01-03 MED ORDER — DOXEPIN HCL 50 MG PO CAPS: 50.0000 mg | ORAL_CAPSULE | Freq: Every evening | ORAL | 0 refills | Status: DC | PRN

## 2019-01-03 MED ORDER — LURASIDONE HCL 40 MG PO TABS: 40.0000 mg | ORAL_TABLET | Freq: Every day | ORAL | 2 refills | Status: DC

## 2019-01-03 MED ORDER — DULOXETINE HCL 60 MG PO CPEP: 60.0000 mg | ORAL_CAPSULE | Freq: Two times a day (BID) | ORAL | 0 refills | Status: DC

## 2019-01-03 MED ORDER — LAMOTRIGINE 100 MG PO TABS: 100.0000 mg | ORAL_TABLET | Freq: Every day | ORAL | 0 refills | Status: DC
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
## 2019-04-05 ENCOUNTER — Encounter (HOSPITAL_COMMUNITY): Payer: Self-pay | Admitting: Psychiatry

## 2019-04-05 ENCOUNTER — Ambulatory Visit (INDEPENDENT_AMBULATORY_CARE_PROVIDER_SITE_OTHER): Payer: BLUE CROSS/BLUE SHIELD | Admitting: Psychiatry

## 2019-04-05 ENCOUNTER — Other Ambulatory Visit: Payer: Self-pay

## 2019-04-05 DIAGNOSIS — F332 Major depressive disorder, recurrent severe without psychotic features: Secondary | ICD-10-CM | POA: Diagnosis not present

## 2019-04-05 DIAGNOSIS — F411 Generalized anxiety disorder: Secondary | ICD-10-CM | POA: Diagnosis not present

## 2019-04-05 DIAGNOSIS — F319 Bipolar disorder, unspecified: Secondary | ICD-10-CM | POA: Diagnosis not present

## 2019-04-05 MED ORDER — DOXEPIN HCL 75 MG PO CAPS: 75.0000 mg | ORAL_CAPSULE | Freq: Every day | ORAL | 0 refills | Status: DC

## 2019-04-05 MED ORDER — LAMOTRIGINE 100 MG PO TABS: 100.0000 mg | ORAL_TABLET | Freq: Every day | ORAL | 0 refills | Status: DC

## 2019-04-05 MED ORDER — LURASIDONE HCL 40 MG PO TABS: 40.0000 mg | ORAL_TABLET | Freq: Every day | ORAL | 0 refills | Status: DC

## 2019-04-05 MED ORDER — DULOXETINE HCL 60 MG PO CPEP: 60.0000 mg | ORAL_CAPSULE | Freq: Two times a day (BID) | ORAL | 0 refills | Status: DC
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
## 2019-04-18 ENCOUNTER — Encounter (HOSPITAL_COMMUNITY): Payer: Self-pay

## 2019-04-18 ENCOUNTER — Emergency Department (HOSPITAL_COMMUNITY)
Admission: EM | Admit: 2019-04-18 | Discharge: 2019-04-18 | Disposition: A | Discharge status: Home / Self Care | Payer: BLUE CROSS/BLUE SHIELD | Attending: Emergency Medicine | Admitting: Emergency Medicine

## 2019-04-18 ENCOUNTER — Other Ambulatory Visit: Payer: Self-pay

## 2019-04-18 DIAGNOSIS — Z79899 Other long term (current) drug therapy: Secondary | ICD-10-CM | POA: Diagnosis not present

## 2019-04-18 DIAGNOSIS — Z7901 Long term (current) use of anticoagulants: Secondary | ICD-10-CM | POA: Diagnosis not present

## 2019-04-18 DIAGNOSIS — Z86718 Personal history of other venous thrombosis and embolism: Secondary | ICD-10-CM | POA: Insufficient documentation

## 2019-04-18 DIAGNOSIS — F17210 Nicotine dependence, cigarettes, uncomplicated: Secondary | ICD-10-CM | POA: Insufficient documentation

## 2019-04-18 DIAGNOSIS — M79605 Pain in left leg: Secondary | ICD-10-CM | POA: Diagnosis not present

## 2019-04-18 MED ORDER — HYDROCODONE-ACETAMINOPHEN 5-325 MG PO TABS
1.0000 | ORAL_TABLET | Freq: Once | ORAL | Status: AC
  Administered 2019-04-18: 21:00:00 1 via ORAL
  Filled 2019-04-18: qty 1

## 2019-04-18 MED ORDER — HYDROCODONE-ACETAMINOPHEN 5-325 MG PO TABS: 2.0000 | ORAL_TABLET | Freq: Four times a day (QID) | ORAL | 0 refills | Status: DC | PRN

## 2019-04-18 NOTE — ED Notes (Signed)
PA explained plan of care and discharge to pt.

## 2019-04-18 NOTE — ED Provider Notes (Signed)
[facility] EMERGENCY DEPARTMENT Provider Note   CSN: [id number] Arrival date & time: 04/18/19  1747     History   Chief Complaint Chief Complaint  Patient presents with  . Poss DVT    HPI Janet Hester is a 53 y.o. female presenting for evaluation of L leg pain.  Pt states last night she developed L leg pain. It began in her hip and radiates down her leg to her calf. Pt describes it as pulsating. She states it feels identical to her previous DVT. She is on xarelto, and has been taking it as prescribed. Pain is constant, did not improve with tylneol. Pt states she recently had a long car trip, 3 hrs each way. She denies other risk factors including trauma, surgery, immobilization, h/o CA, or hormone use. She recently started a statin, but no other medication changes. She denies fevers, chills, back pain, numbness, cp, sob, palpitations.   Additional history obtained from chart review, pt with h/o dvt on xarelto, arthritis, anxiety, depression.      HPI  Past Medical History:  Diagnosis Date  . Arthritis   . Bursitis   . Bursitis   . Depression   . DVT, lower extremity, distal ([place]) 05/04/2012   DVT in a branch off of LEFT prox popliteal vein, mid posterior tibial vein, and upper calf of the peroneal vein.   . Shingles   . Thrombus     Patient Active Problem List   Diagnosis Date Noted  . Bipolar 1 disorder, depressed, severe ([place]) 07/22/2018  . MDD (major depressive disorder), recurrent severe, without psychosis ([place]) 04/16/2017  . Lichen sclerosus 41/63/8453  . Menopausal disorder 04/25/2016  . History of DVT in adulthood 04/25/2016  . Depression 04/25/2016  . Hyperlipidemia with target LDL less than 160 11/14/2015  . Obesity (BMI 35.0-39.9 without comorbidity) 11/14/2015  . Tobacco abuse 11/14/2015  . Lymphedema of lower extremity 03/22/2015  . Primary hypercoagulable state ([place]) 03/15/2015  . Routine general medical examination at a health  care facility 03/15/2015  . Visit for screening mammogram 03/15/2015  . Panic anxiety syndrome 03/15/2015  . Herpes labialis 11/23/2013  . Trochanteric bursitis of right hip 10/27/2012  . DVT, lower extremity, distal ([place]) 05/04/2012    Past Surgical History:  Procedure Laterality Date  . APPENDECTOMY    . LASER ABLATION       OB History   No obstetric history on file.      Home Medications    Prior to Admission medications   Medication Sig Start Date End Date Taking? Authorizing Provider  atorvastatin (LIPITOR) 20 MG tablet Take by mouth. 04/01/19   [provider]  cetirizine (ZYRTEC) 10 MG tablet Take 10 mg by mouth daily as needed for allergies.    [provider]  doxepin (SINEQUAN) 75 MG capsule Take 1 capsule (75 mg total) by mouth at bedtime. 04/05/19   Arfeen, Arlyce Harman, MD  DULoxetine (CYMBALTA) 60 MG capsule Take 1 capsule (60 mg total) by mouth 2 (two) times daily. 04/05/19   Arfeen, Arlyce Harman, MD  EPINEPHrine 0.3 mg/0.3 mL IJ SOAJ injection INJECT 0.3 MLS (0.3MG  TOTAL) INTO THE MUSCLE ONCE AS NEEDED FOR UP TO 1 DOSE FOR ANAPHYLAXIS 12/14/18   [provider]  gabapentin (NEURONTIN) 300 MG capsule Take 300 mg by mouth at bedtime.    Hunter, Megan A, FNP  hydrochlorothiazide (HYDRODIURIL) 25 MG tablet Take 25 mg by mouth daily. 08/17/18   [provider]  HYDROcodone-acetaminophen (NORCO/VICODIN) 5-325 MG tablet Take 2 tablets by mouth every 6 (six) hours as needed for severe pain. 04/18/19   Evetta Renner, PA-C  lamoTRIgine (LAMICTAL) 100 MG tablet Take 1 tablet (100 mg total) by mouth daily. 04/05/19   Arfeen, Arlyce Harman, MD  lurasidone (LATUDA) 40 MG TABS tablet Take 1 tablet (40 mg total) by mouth daily with breakfast. 04/05/19   Arfeen, Arlyce Harman, MD  rivaroxaban (XARELTO) 20 MG TABS tablet Take 1 tablet (20 mg total) by mouth daily with supper. 04/20/17   Mordecai Maes, NP    Family History Family History  Problem Relation Age of Onset  .  Pulmonary embolism Mother   . Cancer Mother 76       cervical cancer  . Deep vein thrombosis Father   . Deep vein thrombosis Paternal Aunt   . Deep vein thrombosis Paternal Uncle   . Deep vein thrombosis Daughter   . Pulmonary embolism Daughter   . Deep vein thrombosis Daughter   . Colon cancer Neg Hx     Social History Social History   Tobacco Use  . Smoking status: Current Every Day Smoker    Packs/day: 1.00    Years: 20.00    Pack years: 20.00    Types: Cigarettes  . Smokeless tobacco: Never Used  Substance Use Topics  . Alcohol use: No    Alcohol/week: 0.0 standard drinks  . Drug use: No     Allergies   Shellfish-derived products   Review of Systems Review of Systems  Musculoskeletal: Positive for myalgias.  All other systems reviewed and are negative.    Physical Exam Updated Vital Signs BP 134/78 (BP Location: Right Arm)   Pulse 79   Temp 98.3 F (36.8 C) (Oral)   Resp 18   Ht 6' (1.829 m)   Wt 132 kg   SpO2 96%   BMI 39.47 kg/m    Physical Exam Vitals signs and nursing note reviewed.  Constitutional:      General: She is not in acute distress.    Appearance: She is well-developed.     Comments: appears nontoxic  HENT:     Head: Normocephalic and atraumatic.  Eyes:     Extraocular Movements: Extraocular movements intact.     Conjunctiva/sclera: Conjunctivae normal.     Pupils: Pupils are equal, round, and reactive to light.  Neck:     Musculoskeletal: Normal range of motion and neck supple.  Cardiovascular:     Rate and Rhythm: Normal rate and regular rhythm.     Pulses: Normal pulses.  Pulmonary:     Effort: Pulmonary effort is normal. No respiratory distress.     Breath sounds: Normal breath sounds. No wheezing.  Abdominal:     General: Bowel sounds are normal. There is no distension.     Palpations: Abdomen is soft.     Tenderness: There is no abdominal tenderness.  Musculoskeletal:        General: Swelling and tenderness present.      Comments: Left leg mildly more swollen than right.  Tenderness palpation of calf and lateral thigh.  No erythema, warmth, or signs of cellulitis.  Pedal pulses intact bilaterally.  Good distal cap refill.  Skin:    General: Skin is warm and dry.     Capillary Refill: Capillary refill takes less than 2 seconds.  Neurological:     Mental Status: She is alert and oriented to person, place, and time.      ED  Treatments / Results  Labs (all labs ordered are listed, but only abnormal results are displayed) Labs Reviewed - No data to display  EKG None  Radiology No results found.  Procedures Procedures (including critical care time)  Medications Ordered in ED Medications  HYDROcodone-acetaminophen (NORCO/VICODIN) 5-325 MG per tablet 1 tablet (1 tablet Oral Given 04/18/19 2116)     Initial Impression / Assessment and Plan / ED Course  I have reviewed the triage vital signs and the nursing notes.  Pertinent labs & imaging results that were available during my care of the patient were reviewed by me and considered in my medical decision making (see chart for details).        Patient presenting for evaluation of left leg pain and swelling.  Physical exam shows patient appears nontoxic.  She is neurovascularly intact.  Tenderness palpation of calf and lateral thigh.  Patient states this feels identical to when she had a DVT previously.  However, patient is on Xarelto, which lowers her risk.  No chest pain or shortness of breath, doubt PE at this time. No signs of cellulitis. Also consider myalgias due to statin vs MSK cause of pain.   Patient requesting Lovenox.  I discussed with pharmacy, who states risks outweigh the benefit.  Discussed with patient that we cannot do Lovenox shot tonight.  Discussed will order outpatient ultrasound for tomorrow, as ultrasound is not available at this time.  Encourage patient to take her Xarelto as prescribed.  Return if she develops chest pain or  shortness of breath.  Pain control, PMP checked, patient without concerning use.  At this time, patient appears safe for discharge.  Return precautions given.  Patient states she understands and agrees to plan.  Final Clinical Impressions(s) / ED Diagnoses   Final diagnoses:  Left leg pain    ED Discharge Orders         Ordered    HYDROcodone-acetaminophen (NORCO/VICODIN) 5-325 MG tablet  Every 6 hours PRN     04/18/19 2121    UE VENOUS DUPLEX     04/18/19 2122           Franchot Heidelberg, PA-C 04/18/19 2353    Quintella Reichert, MD 04/19/19 1155

## 2019-04-18 NOTE — ED Triage Notes (Signed)
Pt arrives POV for eval of L calf pain onset last evening. Pt reports hx of DVTs w/ anticoagulation on xarelto. Pt states this feels similar to last DVTs. L foot w/ +DP pulses, +CSM, foot is WWP. Pt denies known injury or trauma

## 2019-04-18 NOTE — Discharge Instructions (Addendum)
Use Norco as needed for severe pain.  Use Tylenol as needed for mild to moderate pain. Use heating pads to help with pain and swelling. Follow-up as per the instructions below for your ultrasound tomorrow. Return to the emergency room immediately if you develop any chest pain or shortness of breath.  Return with any new, worsening, concerning symptoms.

## 2019-04-19 ENCOUNTER — Other Ambulatory Visit (HOSPITAL_COMMUNITY): Payer: Self-pay | Admitting: Student

## 2019-04-19 ENCOUNTER — Ambulatory Visit (HOSPITAL_COMMUNITY)
Admission: RE | Admit: 2019-04-19 | Discharge: 2019-04-19 | Disposition: A | Discharge status: Home / Self Care | Payer: BLUE CROSS/BLUE SHIELD | Source: Ambulatory Visit | Attending: Student | Admitting: Student

## 2019-04-19 DIAGNOSIS — R52 Pain, unspecified: Secondary | ICD-10-CM

## 2019-04-19 NOTE — Progress Notes (Signed)
Lower extremity venous has been completed.   Preliminary results in CV Proc.   Abram Sander 04/19/2019 9:57 AM
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
## 2019-07-06 ENCOUNTER — Other Ambulatory Visit: Payer: Self-pay

## 2019-07-06 ENCOUNTER — Ambulatory Visit (INDEPENDENT_AMBULATORY_CARE_PROVIDER_SITE_OTHER): Payer: BLUE CROSS/BLUE SHIELD | Admitting: Psychiatry

## 2019-07-06 ENCOUNTER — Encounter (HOSPITAL_COMMUNITY): Payer: Self-pay | Admitting: Psychiatry

## 2019-07-06 DIAGNOSIS — F411 Generalized anxiety disorder: Secondary | ICD-10-CM | POA: Diagnosis not present

## 2019-07-06 DIAGNOSIS — F319 Bipolar disorder, unspecified: Secondary | ICD-10-CM

## 2019-07-06 MED ORDER — LAMOTRIGINE 100 MG PO TABS: 100.0000 mg | ORAL_TABLET | Freq: Every day | ORAL | 0 refills | Status: DC

## 2019-07-06 MED ORDER — DULOXETINE HCL 60 MG PO CPEP: 60.0000 mg | ORAL_CAPSULE | Freq: Two times a day (BID) | ORAL | 0 refills | Status: DC

## 2019-07-06 MED ORDER — DOXEPIN HCL 75 MG PO CAPS: 75.0000 mg | ORAL_CAPSULE | Freq: Every day | ORAL | 0 refills | Status: DC

## 2019-07-06 MED ORDER — LURASIDONE HCL 40 MG PO TABS: 40.0000 mg | ORAL_TABLET | Freq: Every day | ORAL | 0 refills | Status: DC
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
## 2019-07-21 ENCOUNTER — Telehealth (HOSPITAL_COMMUNITY): Payer: Self-pay

## 2019-07-21 MED ORDER — TRAZODONE HCL 100 MG PO TABS: 100.0000 mg | ORAL_TABLET | Freq: Every day | ORAL | 0 refills | Status: DC

## 2019-07-21 NOTE — Telephone Encounter (Signed)
She can try trazodone 100 mg half to 1 tablet as needed for insomnia.  She can discontinue doxepin.  Please call trazodone to her pharmacy.

## 2019-07-21 NOTE — Telephone Encounter (Signed)
I sent in the Trazodone and patient asked for Latuda samples, I have gotten these ready for patient to pick up in the morning

## 2019-07-21 NOTE — Telephone Encounter (Signed)
Patient is calling because she states that the Doxepin is causing weight gain, she has gained 6 lbs in the last week. Patient would like a medication that will not cause her to gain weight. Please review and advise, thank you

## 2019-09-20 ENCOUNTER — Other Ambulatory Visit (HOSPITAL_COMMUNITY): Payer: Self-pay | Admitting: Psychiatry

## 2019-09-20 ENCOUNTER — Telehealth (HOSPITAL_COMMUNITY): Payer: Self-pay | Admitting: *Deleted

## 2019-09-20 MED ORDER — TRAZODONE HCL 100 MG PO TABS: 100.0000 mg | ORAL_TABLET | Freq: Every day | ORAL | 0 refills | Status: DC

## 2019-09-20 NOTE — Telephone Encounter (Signed)
Received message from pt requesting a refill on the Trazadone last filled 07/21/19.  Pt has an upcoming appointment on 10/04/19.   traZODone (DESYREL) 100 MG tablet 100 mg, Daily at bedtime 0 ordered        Summary: Take 1 tablet (100 mg total) by mouth at bedtime., Starting

## 2019-09-20 NOTE — Telephone Encounter (Signed)
I called her prescription to [facility]. Please call her to pick up her meds.

## 2019-10-04 ENCOUNTER — Ambulatory Visit (HOSPITAL_COMMUNITY): Payer: BLUE CROSS/BLUE SHIELD | Admitting: Psychiatry

## 2019-10-11 ENCOUNTER — Other Ambulatory Visit: Payer: Self-pay

## 2019-10-11 ENCOUNTER — Ambulatory Visit (INDEPENDENT_AMBULATORY_CARE_PROVIDER_SITE_OTHER): Payer: BLUE CROSS/BLUE SHIELD | Admitting: Psychiatry

## 2019-10-11 ENCOUNTER — Encounter (HOSPITAL_COMMUNITY): Payer: Self-pay | Admitting: Psychiatry

## 2019-10-11 DIAGNOSIS — F319 Bipolar disorder, unspecified: Secondary | ICD-10-CM | POA: Diagnosis not present

## 2019-10-11 DIAGNOSIS — F411 Generalized anxiety disorder: Secondary | ICD-10-CM | POA: Diagnosis not present

## 2019-10-11 MED ORDER — LAMOTRIGINE 100 MG PO TABS: 100.0000 mg | ORAL_TABLET | Freq: Every day | ORAL | 0 refills | Status: DC

## 2019-10-11 MED ORDER — LURASIDONE HCL 40 MG PO TABS: 40.0000 mg | ORAL_TABLET | Freq: Every day | ORAL | 0 refills | Status: DC

## 2019-10-11 MED ORDER — TRAZODONE HCL 100 MG PO TABS: 100.0000 mg | ORAL_TABLET | Freq: Every day | ORAL | 0 refills | Status: DC

## 2019-10-11 MED ORDER — DULOXETINE HCL 60 MG PO CPEP: 60.0000 mg | ORAL_CAPSULE | Freq: Two times a day (BID) | ORAL | 0 refills | Status: DC
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
## 2019-10-20 ENCOUNTER — Telehealth (HOSPITAL_COMMUNITY): Payer: Self-pay

## 2019-10-20 NOTE — Telephone Encounter (Signed)
[employer] [place] [employer] APPROVED  LATUDA 40MG  TABLET EFFECTIVE 10/13/2019 TO 10/11/2022

## 2019-11-10 ENCOUNTER — Telehealth (HOSPITAL_COMMUNITY): Payer: Self-pay | Admitting: Psychiatry

## 2019-11-10 NOTE — Telephone Encounter (Signed)
D:  Pt had dropped off a discharge application:  Total and permanent disability form for Dr. Adele Schilder to complete.  Case manager informed Dr. Adele Schilder.  Dr. Adele Schilder is recommending pt to go to another MD to have it completed because he doesn't complete this type of form.  A:  Placed call to inform pt.  She is insisting that Dr. Adele Schilder complete the form.  "My condition is d/t mental and he prescribes the mental medications.  He's my mental doctor."  Case manager mentioned that she will give Dr. Adele Schilder her message.

## 2020-01-11 ENCOUNTER — Ambulatory Visit (INDEPENDENT_AMBULATORY_CARE_PROVIDER_SITE_OTHER): Payer: Medicare (Managed Care) | Admitting: Psychiatry

## 2020-01-11 ENCOUNTER — Encounter (HOSPITAL_COMMUNITY): Payer: Self-pay | Admitting: Psychiatry

## 2020-01-11 ENCOUNTER — Other Ambulatory Visit: Payer: Self-pay

## 2020-01-11 DIAGNOSIS — F411 Generalized anxiety disorder: Secondary | ICD-10-CM | POA: Diagnosis not present

## 2020-01-11 DIAGNOSIS — F319 Bipolar disorder, unspecified: Secondary | ICD-10-CM | POA: Diagnosis not present

## 2020-01-11 MED ORDER — TRAZODONE HCL 100 MG PO TABS: 100.0000 mg | ORAL_TABLET | Freq: Every day | ORAL | 0 refills | Status: DC

## 2020-01-11 MED ORDER — LAMOTRIGINE 100 MG PO TABS: 100.0000 mg | ORAL_TABLET | Freq: Every day | ORAL | 0 refills | Status: DC

## 2020-01-11 MED ORDER — DULOXETINE HCL 60 MG PO CPEP: 60.0000 mg | ORAL_CAPSULE | Freq: Two times a day (BID) | ORAL | 0 refills | Status: DC

## 2020-01-11 MED ORDER — ZIPRASIDONE HCL 20 MG PO CAPS: 20.0000 mg | ORAL_CAPSULE | Freq: Two times a day (BID) | ORAL | 1 refills | Status: DC
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
## 2020-02-09 ENCOUNTER — Encounter (HOSPITAL_COMMUNITY): Payer: Self-pay | Admitting: Psychiatry

## 2020-02-09 ENCOUNTER — Telehealth (INDEPENDENT_AMBULATORY_CARE_PROVIDER_SITE_OTHER): Payer: Medicare (Managed Care) | Admitting: Psychiatry

## 2020-02-09 ENCOUNTER — Other Ambulatory Visit: Payer: Self-pay

## 2020-02-09 DIAGNOSIS — F411 Generalized anxiety disorder: Secondary | ICD-10-CM | POA: Diagnosis not present

## 2020-02-09 DIAGNOSIS — F319 Bipolar disorder, unspecified: Secondary | ICD-10-CM | POA: Diagnosis not present

## 2020-02-09 MED ORDER — ZIPRASIDONE HCL 20 MG PO CAPS: 20.0000 mg | ORAL_CAPSULE | Freq: Two times a day (BID) | ORAL | 0 refills | Status: DC
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
## 2020-03-20 ENCOUNTER — Other Ambulatory Visit (HOSPITAL_COMMUNITY): Payer: Self-pay | Admitting: Psychiatry

## 2020-03-20 DIAGNOSIS — F319 Bipolar disorder, unspecified: Secondary | ICD-10-CM

## 2020-03-22 ENCOUNTER — Telehealth (HOSPITAL_COMMUNITY): Payer: Self-pay

## 2020-03-22 ENCOUNTER — Other Ambulatory Visit (HOSPITAL_COMMUNITY): Payer: Self-pay

## 2020-03-22 DIAGNOSIS — F319 Bipolar disorder, unspecified: Secondary | ICD-10-CM

## 2020-03-22 MED ORDER — TRAZODONE HCL 100 MG PO TABS: 100.0000 mg | ORAL_TABLET | Freq: Every day | ORAL | 0 refills | Status: DC

## 2020-03-22 NOTE — Telephone Encounter (Signed)
Patient called regarding her Trazodone 100mg . She stated that she's completely out. I saw that on 01/11/2020 #90 was sent in and the pharmacy stated that she picked them up at that time and that they gave her the full script. When I spoke with the patient, she stated that she does not have any left and that she doesn't have a bottle around anywhere that she may have forgotten about. I asked the pharmacy if they could give her 5 tablets to hold her over until her appointment on 03/27/20 but they stated that a new script would have to be sent in. Please review and advise. Thank you.

## 2020-03-22 NOTE — Telephone Encounter (Signed)
Done and notified patient

## 2020-03-22 NOTE — Telephone Encounter (Signed)
Please call her trazodone for 1 week supply and we will discuss this issue on her next appointment.

## 2020-03-23 ENCOUNTER — Telehealth (HOSPITAL_COMMUNITY): Payer: Medicare (Managed Care) | Admitting: Psychiatry

## 2020-03-27 ENCOUNTER — Other Ambulatory Visit: Payer: Self-pay

## 2020-03-27 ENCOUNTER — Encounter (HOSPITAL_COMMUNITY): Payer: Self-pay | Admitting: Psychiatry

## 2020-03-27 ENCOUNTER — Telehealth (INDEPENDENT_AMBULATORY_CARE_PROVIDER_SITE_OTHER): Payer: Medicare HMO | Admitting: Psychiatry

## 2020-03-27 DIAGNOSIS — F411 Generalized anxiety disorder: Secondary | ICD-10-CM

## 2020-03-27 DIAGNOSIS — F319 Bipolar disorder, unspecified: Secondary | ICD-10-CM

## 2020-03-27 MED ORDER — ZIPRASIDONE HCL 20 MG PO CAPS: 20.0000 mg | ORAL_CAPSULE | Freq: Two times a day (BID) | ORAL | 0 refills | Status: DC

## 2020-03-27 MED ORDER — TRAZODONE HCL 100 MG PO TABS: 100.0000 mg | ORAL_TABLET | Freq: Every day | ORAL | 0 refills | Status: DC

## 2020-03-27 MED ORDER — LAMOTRIGINE 100 MG PO TABS: 100.0000 mg | ORAL_TABLET | Freq: Every day | ORAL | 0 refills | Status: DC

## 2020-03-27 MED ORDER — DULOXETINE HCL 60 MG PO CPEP: 60.0000 mg | ORAL_CAPSULE | Freq: Two times a day (BID) | ORAL | 0 refills | Status: DC
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
## 2020-05-17 ENCOUNTER — Telehealth (HOSPITAL_COMMUNITY): Payer: Self-pay

## 2020-05-17 NOTE — Telephone Encounter (Signed)
NOTIFIED PHARMACY/PT RECEIVES TEXT NOTIFICATION

## 2020-05-17 NOTE — Telephone Encounter (Signed)
[employer] MEDICARE [employer]MI[employer]AN PRESCRIPTION COVERAGE APPROVED  ZIPRASIDONE 20MG  CAPSULE CASE# [id number] EFFECTIVE 03/13/2020 TO 10/12/2020

## 2020-06-28 ENCOUNTER — Encounter (HOSPITAL_COMMUNITY): Payer: Self-pay | Admitting: Psychiatry

## 2020-06-28 ENCOUNTER — Other Ambulatory Visit: Payer: Self-pay

## 2020-06-28 ENCOUNTER — Telehealth (INDEPENDENT_AMBULATORY_CARE_PROVIDER_SITE_OTHER): Payer: Medicare HMO | Admitting: Psychiatry

## 2020-06-28 VITALS — Wt 289.0 lb

## 2020-06-28 DIAGNOSIS — F319 Bipolar disorder, unspecified: Secondary | ICD-10-CM | POA: Diagnosis not present

## 2020-06-28 DIAGNOSIS — F411 Generalized anxiety disorder: Secondary | ICD-10-CM | POA: Diagnosis not present

## 2020-06-28 MED ORDER — DULOXETINE HCL 60 MG PO CPEP: 60.0000 mg | ORAL_CAPSULE | Freq: Two times a day (BID) | ORAL | 0 refills | Status: DC

## 2020-06-28 MED ORDER — TRAZODONE HCL 100 MG PO TABS: 100.0000 mg | ORAL_TABLET | Freq: Every day | ORAL | 0 refills | Status: DC

## 2020-06-28 MED ORDER — ZIPRASIDONE HCL 20 MG PO CAPS: 20.0000 mg | ORAL_CAPSULE | Freq: Two times a day (BID) | ORAL | 0 refills | Status: DC

## 2020-06-28 MED ORDER — LAMOTRIGINE 100 MG PO TABS: 100.0000 mg | ORAL_TABLET | Freq: Every day | ORAL | 0 refills | Status: DC

## 2020-06-28 MED ORDER — HYDROXYZINE HCL 25 MG PO TABS: 25.0000 mg | ORAL_TABLET | ORAL | 0 refills | Status: DC | PRN

## 2020-06-28 NOTE — Progress Notes (Signed)
Virtual Visit via Telephone Note  I connected with Janet Hester on 06/28/20 at 10:20 AM EDT by telephone and verified that I am speaking with the correct person using two identifiers.  Location: Patient: home Provider: home office   I discussed the limitations, risks, security and privacy concerns of performing an evaluation and management service by telephone and the availability of in person appointments. I also discussed with the patient that there may be a patient responsible charge related to this service. The patient expressed understanding and agreed to proceed.   History of Present Illness: Patient is evaluated by phone session.  She endorsed lately increased anxiety because she moved to a new place and she thought her issues will resolve but her neighbor complain and call the police on her.  Patient told neighbor complained that she was loud and it was very difficult movement because she had police at her place.  Patient denied she was allowed because she lives by herself and no one is around.  She has to take hydroxyzine leftover to calm her down.  Now she had called the office and moving to a different apartment but in the same complex.  She admitted stressful because this week and she has to move.  Patient denies drinking or using any illegal substances.  She is taking medication as prescribed.  She denies any mania, psychosis, hallucination.  She has a procedure next month for her back and hoping that will help her back pain.  She is seeing her life coach at [facility].  She is on disability.  She lives with her animals.  She does not want to change the medication because she feels it is working and her anxiety is situational.  Her appetite is okay and she has lost few pounds since the last visit.   Past Psychiatric History:Reviewed H/Odepression,hallucination, mania, abuseandanger. H/O overdose and multiple inpatient. Last inpatientin2019. Tried Paxil, Klonopin, Prozac,  Wellbutrin,Topamax and trazodone.    Psychiatric Specialty Exam: Physical Exam  Review of Systems  Weight 289 lb (131.1 kg).There is no height or weight on file to calculate BMI.  General Appearance: NA  Eye Contact:  NA  Speech:  Normal Rate  Volume:  Normal  Mood:  Dysphoric  Affect:  NA  Thought Process:  Descriptions of Associations: Intact  Orientation:  Full (Time, Place, and Person)  Thought Content:  Rumination  Suicidal Thoughts:  No  Homicidal Thoughts:  No  Memory:  Immediate;   Good Recent;   Good Remote;   Good  Judgement:  Intact  Insight:  Present  Psychomotor Activity:  NA  Concentration:  Concentration: Fair and Attention Span: Fair  Recall:  Good  Fund of Knowledge:  Good  Language:  Good  Akathisia:  No  Handed:  Right  AIMS (if indicated):     Assets:  Communication Skills Desire for Improvement Resilience  ADL's:  Intact  Cognition:  WNL  Sleep:   ok      Assessment and Plan: Bipolar disorder type I.  Generalized anxiety disorder.  Discussed her living situation.  Patient refused leftover hydroxyzine which was given more than a year ago.  I recommend not to use expired medicine.  Patient agreed but like to have a few pills of hydroxyzine in case she had extreme anxiety.  I agree we will provide 10 tablets of hydroxyzine 25 mg.  Reassurance given about her move this weekend.  Continue Cymbalta 120 mg daily, Geodon 20 mg twice a day, Lamictal  100 mg daily and trazodone 100 mg at bedtime.  Discussed medication side effects and benefits.  Patient has no rash, itching tremors or shakes.  Recommended to call us back if she has any question or any concern.  Follow-up in 3 months.  Follow Up Instructions:    I discussed the assessment and treatment plan with the patient. The patient was provided an opportunity to ask questions and all were answered. The patient agreed with the plan and demonstrated an understanding of the instructions.   The patient was  advised to call back or seek an in-person evaluation if the symptoms worsen or if the condition fails to improve as anticipated.  I provided 16 minutes of non-face-to-face time during this encounter.   Kathlee Nations, MD

## 2020-09-27 ENCOUNTER — Telehealth (INDEPENDENT_AMBULATORY_CARE_PROVIDER_SITE_OTHER): Payer: Medicare HMO | Admitting: Psychiatry

## 2020-09-27 ENCOUNTER — Encounter (HOSPITAL_COMMUNITY): Payer: Self-pay | Admitting: Psychiatry

## 2020-09-27 ENCOUNTER — Other Ambulatory Visit: Payer: Self-pay

## 2020-09-27 DIAGNOSIS — F411 Generalized anxiety disorder: Secondary | ICD-10-CM | POA: Diagnosis not present

## 2020-09-27 DIAGNOSIS — F319 Bipolar disorder, unspecified: Secondary | ICD-10-CM

## 2020-09-27 MED ORDER — LAMOTRIGINE 100 MG PO TABS: 100.0000 mg | ORAL_TABLET | Freq: Every day | ORAL | 0 refills | Status: DC

## 2020-09-27 MED ORDER — TRAZODONE HCL 100 MG PO TABS: 100.0000 mg | ORAL_TABLET | Freq: Every day | ORAL | 0 refills | Status: DC

## 2020-09-27 MED ORDER — ZIPRASIDONE HCL 20 MG PO CAPS: 20.0000 mg | ORAL_CAPSULE | Freq: Two times a day (BID) | ORAL | 0 refills | Status: DC

## 2020-09-27 MED ORDER — DULOXETINE HCL 60 MG PO CPEP: 60.0000 mg | ORAL_CAPSULE | Freq: Two times a day (BID) | ORAL | 0 refills | Status: DC

## 2020-09-27 NOTE — Progress Notes (Signed)
Virtual Visit via Telephone Note  I connected with Janet Hester on 09/27/20 at 10:40 AM EST by telephone and verified that I am speaking with the correct person using two identifiers.  Location: Patient: Home Provider: Home Office   I discussed the limitations, risks, security and privacy concerns of performing an evaluation and management service by telephone and the availability of in person appointments. I also discussed with the patient that there may be a patient responsible charge related to this service. The patient expressed understanding and agreed to proceed.   History of Present Illness: Patient is evaluated by phone session.  She is on the phone by herself.  She is doing better since change her building and does not have to deal with the neighbor.  She is sleeping better with trazodone.  Her anxiety is under control.  Recently she had a blood work and her lipid panel was abnormal.  She is now taking medicine to control her cholesterol.  She is more focused on her health and started going to gym and regular exercise.  She denies any crying spells or any feeling of hopelessness or worthlessness.  She continues to see her life coach at [employer].  Patient feels it helped her a lot.  She is on disability and lives with her animals.  She is not taking hydroxyzine because she is not feeling as anxious and nervous.  She denies any mania, psychosis, hallucination.  She denies any impulsive behavior.  She wants to keep her current medication since it is working.  She has no tremors shakes or any EPS.  She is hoping to have a Christmas with her daughter who lives close by.  She denies drinking or using any illegal substances.   Past Psychiatric History:Reviewed H/Odepression,hallucination, mania, abuseandanger. H/O overdose and multiple inpatient. Last inpatientin2019. Tried Paxil, Klonopin, Prozac, Wellbutrin,Topamax and trazodone.    Psychiatric Specialty Exam: Physical Exam   Review of Systems  Weight 289 lb (131.1 kg).There is no height or weight on file to calculate BMI.  General Appearance: NA  Eye Contact:  NA  Speech:  Clear and Coherent  Volume:  Normal  Mood:  Anxious  Affect:  NA  Thought Process:  Goal Directed  Orientation:  Full (Time, Place, and Person)  Thought Content:  WDL  Suicidal Thoughts:  No  Homicidal Thoughts:  No  Memory:  Immediate;   Good Recent;   Good Remote;   Fair  Judgement:  Intact  Insight:  Present  Psychomotor Activity:  NA  Concentration:  Concentration: Fair and Attention Span: Fair  Recall:  [employer] of Knowledge:  Good  Language:  Good  Akathisia:  No  Handed:  Right  AIMS (if indicated):     Assets:  Communication Skills Desire for Improvement Housing Resilience  ADL's:  Intact  Cognition:  WNL  Sleep:   ok      Assessment and Plan: Bipolar disorder type I.  Generalized anxiety disorder.  Patient doing better since she moved to a different building and does not have to deal with a neighbor.  She has not taken hydroxyzine since last visit.  Discussed medication side effects and benefits.  Continue Cymbalta 120 mg daily, Geodon 40 mg twice a day, Lamictal 100 mg daily and trazodone 1 mg at bedtime.  She has no rash, itching, tremors or shakes.  She is trying to lose weight because recent blood work shows high cholesterol.  Encouraged to continue healthy lifestyle and watch her  caloric intake.  Recommended to call us back if she has any question or any concern.  Follow-up in 3 months.  Follow Up Instructions:    I discussed the assessment and treatment plan with the patient. The patient was provided an opportunity to ask questions and all were answered. The patient agreed with the plan and demonstrated an understanding of the instructions.   The patient was advised to call back or seek an in-person evaluation if the symptoms worsen or if the condition fails to improve as anticipated.  I provided 14  minutes of non-face-to-face time during this encounter.   Kathlee Nations, MD

## 2020-12-20 ENCOUNTER — Encounter (HOSPITAL_COMMUNITY): Payer: Self-pay | Admitting: Psychiatry

## 2020-12-20 ENCOUNTER — Other Ambulatory Visit: Payer: Self-pay

## 2020-12-20 ENCOUNTER — Telehealth (INDEPENDENT_AMBULATORY_CARE_PROVIDER_SITE_OTHER): Payer: Medicare HMO | Admitting: Psychiatry

## 2020-12-20 VITALS — Wt 290.0 lb

## 2020-12-20 DIAGNOSIS — F419 Anxiety disorder, unspecified: Secondary | ICD-10-CM

## 2020-12-20 DIAGNOSIS — F319 Bipolar disorder, unspecified: Secondary | ICD-10-CM

## 2020-12-20 MED ORDER — LAMOTRIGINE 100 MG PO TABS: 100.0000 mg | ORAL_TABLET | Freq: Every day | ORAL | 0 refills | Status: DC

## 2020-12-20 MED ORDER — ZIPRASIDONE HCL 20 MG PO CAPS: 20.0000 mg | ORAL_CAPSULE | Freq: Two times a day (BID) | ORAL | 0 refills | Status: DC

## 2020-12-20 MED ORDER — DULOXETINE HCL 60 MG PO CPEP: 60.0000 mg | ORAL_CAPSULE | Freq: Two times a day (BID) | ORAL | 0 refills | Status: DC

## 2020-12-20 MED ORDER — TRAZODONE HCL 100 MG PO TABS: 100.0000 mg | ORAL_TABLET | Freq: Every day | ORAL | 0 refills | Status: DC

## 2020-12-20 NOTE — Progress Notes (Signed)
Virtual Visit via Telephone Note  I connected with Janet Hester on 12/20/20 at  8:40 AM EST by telephone and verified that I am speaking with the correct person using two identifiers.  Location: Patient: Home Provider: Home Office   I discussed the limitations, risks, security and privacy concerns of performing an evaluation and management service by telephone and the availability of in person appointments. I also discussed with the patient that there may be a patient responsible charge related to this service. The patient expressed understanding and agreed to proceed.   History of Present Illness: Patient is evaluated by phone session.  She is doing better on medication.  Since she moved the new place more than 4 months ago she is more relaxed and more calm.  She is sleeping good.  She like trazodone which is helping her sleep.  She denies any irritability, anger, mania or any impulsive behavior.  She denies any panic attack.  Her energy level is good.  She does walk every day and more focused on healthy diet.  She is disappointed because her weight remains stable but she feels much better overall.  Patient is on disability and lives with her animals.  She continues to see her life coach at [employer].  She has no rash, itching, tremors or shakes.  She denies any pain.  She feels the Neurontin helping her hot flashes.  She like to keep all her current medication.  She has blood work in first week of December with high cholesterol and now she is scheduled to have another blood work to repeat the cholesterol.  Her primary care physician as [facility].  Her last BUN was 18 creatinine 0.87.  Past Psychiatric History:Reviewed H/Odepression,hallucination, mania, abuseandanger. H/Ooverdose and multiple inpatient. Lastinpatientin2019.Tried Paxil, Klonopin, Prozac, Wellbutrin,Topamax and trazodone.    Psychiatric Specialty Exam: Physical Exam  Review of Systems  Weight 290 lb (131.5  kg).There is no height or weight on file to calculate BMI.  General Appearance: NA  Eye Contact:  NA  Speech:  Normal Rate  Volume:  Normal  Mood:  Euthymic  Affect:  NA  Thought Process:  Goal Directed  Orientation:  Full (Time, Place, and Person)  Thought Content:  Logical  Suicidal Thoughts:  No  Homicidal Thoughts:  No  Memory:  Immediate;   Good Recent;   Good Remote;   Fair  Judgement:  Intact  Insight:  Present  Psychomotor Activity:  NA  Concentration:  Concentration: Fair and Attention Span: Fair  Recall:  [employer] of Knowledge:  Good  Language:  Good  Akathisia:  No  Handed:  Right  AIMS (if indicated):     Assets:  Communication Skills Desire for Improvement Housing Transportation  ADL's:  Intact  Cognition:  WNL  Sleep:   good with trazadone    Assessment and Plan: Bipolar disorder type I.  Anxiety.  Patient continues to remain stable on the medication.  We talked about reducing the medication however patient reluctant to cut down the medication as she feels they are keeping her stable.  She has no rash or itching or tremors.  Continue Geodon 20 mg twice a day, Lamictal 100 mg daily, trazodone 100 mg at bedtime and Cymbalta 60 mg twice a day.  Encouraged to continue to visit her life coach at [employer].  Reminded that she need a blood work to recheck her cholesterol.  She is taking gabapentin from other physician that helping her hot flashes.  Recommended to call us back if she has any question or any concern.  She preferred to have her prescription sent to [facility].  Follow-up in 3 months.  Follow Up Instructions:    I discussed the assessment and treatment plan with the patient. The patient was provided an opportunity to ask questions and all were answered. The patient agreed with the plan and demonstrated an understanding of the instructions.   The patient was advised to call back or seek an in-person evaluation if the symptoms worsen or if the  condition fails to improve as anticipated.  I provided 16 minutes of non-face-to-face time during this encounter.   Kathlee Nations, MD

## 2020-12-24 ENCOUNTER — Telehealth (HOSPITAL_COMMUNITY): Payer: Medicare HMO | Admitting: Psychiatry

## 2021-03-19 ENCOUNTER — Other Ambulatory Visit: Payer: Self-pay

## 2021-03-19 ENCOUNTER — Encounter (HOSPITAL_COMMUNITY): Payer: Self-pay | Admitting: Psychiatry

## 2021-03-19 ENCOUNTER — Telehealth (INDEPENDENT_AMBULATORY_CARE_PROVIDER_SITE_OTHER): Payer: Medicare HMO | Admitting: Psychiatry

## 2021-03-19 DIAGNOSIS — F419 Anxiety disorder, unspecified: Secondary | ICD-10-CM

## 2021-03-19 DIAGNOSIS — F319 Bipolar disorder, unspecified: Secondary | ICD-10-CM

## 2021-03-19 MED ORDER — TRAZODONE HCL 100 MG PO TABS: 100.0000 mg | ORAL_TABLET | Freq: Every day | ORAL | 0 refills | Status: DC

## 2021-03-19 MED ORDER — ZIPRASIDONE HCL 20 MG PO CAPS: 20.0000 mg | ORAL_CAPSULE | Freq: Two times a day (BID) | ORAL | 0 refills | Status: DC

## 2021-03-19 MED ORDER — LAMOTRIGINE 100 MG PO TABS: 100.0000 mg | ORAL_TABLET | Freq: Every day | ORAL | 0 refills | Status: DC

## 2021-03-19 MED ORDER — DULOXETINE HCL 60 MG PO CPEP: 60.0000 mg | ORAL_CAPSULE | Freq: Two times a day (BID) | ORAL | 0 refills | Status: DC

## 2021-03-19 NOTE — Progress Notes (Signed)
Virtual Visit via Video Note  I connected with Janet Hester on 03/19/21 at 10:00 AM EDT by a video enabled telemedicine application and verified that I am speaking with the correct person using two identifiers.  Location: Patient: Home Provider: Home Office   I discussed the limitations of evaluation and management by telemedicine and the availability of in person appointments. The patient expressed understanding and agreed to proceed.  History of Present Illness: Patient is evaluated by a video session.  She is taking all her medication and she feels things are going very well.  She greenlight her new place which is now she is more than 6 months.  She lives with her animals and her daughter comes and checks on her frequently.  She denies any mania, psychosis, agitation, crying spells or any suicidal thoughts.  She is walking but cut down to 3 times of week because of knee pain.  She feels her knee pain is coming back and may require visit with orthopedic.  She had lost 7 pounds since the last visit.  She has no rash, itching, tremors or shakes.  Patient is on disability.  She does have a life coach through [employer] and she goes regularly.  She has no rash, tremors shakes or any EPS.  She has appointment with PCP in July for blood work and hoping her cholesterol level gets better.  Past Psychiatric History:Reviewed H/Odepression,hallucination, mania, abuseandanger. H/O overdose and multiple inpatient. Lastinpatientin2019.Tried Paxil, Klonopin, Prozac, Wellbutrin,Topamax and trazodone.  Psychiatric Specialty Exam: Physical Exam  Review of Systems  Weight 283 lb (128.4 kg).There is no height or weight on file to calculate BMI.  General Appearance: Casual  Eye Contact:  Good  Speech:  Normal Rate  Volume:  Normal  Mood:  Euthymic  Affect:  Appropriate  Thought Process:  Goal Directed  Orientation:  Full (Time, Place, and Person)  Thought Content:  Logical  Suicidal  Thoughts:  No  Homicidal Thoughts:  No  Memory:  Immediate;   Good Recent;   Good Remote;   Fair  Judgement:  Intact  Insight:  Present  Psychomotor Activity:  Normal  Concentration:  Concentration: Fair and Attention Span: Fair  Recall:  Good  Fund of Knowledge:  Good  Language:  Good  Akathisia:  No  Handed:  Right  AIMS (if indicated):     Assets:  Communication Skills Desire for Improvement Housing Social Support  ADL's:  Intact  Cognition:  WNL  Sleep:   ok      Assessment and Plan: Bipolar disorder type I.  Anxiety.  Patient doing much better.  She lost 7 pounds since the last visit.  I discussed again about polypharmacy and recommend to try cutting down medication.  She is taking Geodon, lamotrigine, trazodone, Cymbalta and also getting gabapentin from other physician.  She agreed to give a try and we will cut down Lamictal 50 mg however if she feels symptoms coming back then she will go back to Lamictal 100 mg.  She is taking gabapentin for hot flashes.  Recommended to call us back if she has any question or any concern.  Encourage continued life coach at [employer].  Follow-up in 3 months.  Follow Up Instructions:    I discussed the assessment and treatment plan with the patient. The patient was provided an opportunity to ask questions and all were answered. The patient agreed with the plan and demonstrated an understanding of the instructions.   The patient was advised  to call back or seek an in-person evaluation if the symptoms worsen or if the condition fails to improve as anticipated.  I provided 16 minutes of non-face-to-face time during this encounter.   Kathlee Nations, MD

## 2021-04-22 ENCOUNTER — Telehealth (HOSPITAL_COMMUNITY): Payer: Self-pay | Admitting: *Deleted

## 2021-04-22 NOTE — Telephone Encounter (Signed)
PA for Ziprasidone HCL 20 mg capsules, Tier Change Request, approved by [employer] from 10/13/20 through 10/12/21.

## 2021-06-19 ENCOUNTER — Telehealth (HOSPITAL_COMMUNITY): Payer: Medicare HMO | Admitting: Psychiatry

## 2021-06-25 ENCOUNTER — Other Ambulatory Visit: Payer: Self-pay

## 2021-06-25 ENCOUNTER — Telehealth (INDEPENDENT_AMBULATORY_CARE_PROVIDER_SITE_OTHER): Payer: Medicare HMO | Admitting: Psychiatry

## 2021-06-25 ENCOUNTER — Encounter (HOSPITAL_COMMUNITY): Payer: Self-pay | Admitting: Psychiatry

## 2021-06-25 DIAGNOSIS — F419 Anxiety disorder, unspecified: Secondary | ICD-10-CM | POA: Diagnosis not present

## 2021-06-25 DIAGNOSIS — F319 Bipolar disorder, unspecified: Secondary | ICD-10-CM | POA: Diagnosis not present

## 2021-06-25 MED ORDER — DULOXETINE HCL 60 MG PO CPEP: 60.0000 mg | ORAL_CAPSULE | Freq: Two times a day (BID) | ORAL | 0 refills | Status: DC

## 2021-06-25 MED ORDER — TRAZODONE HCL 100 MG PO TABS: 100.0000 mg | ORAL_TABLET | Freq: Every day | ORAL | 0 refills | Status: DC

## 2021-06-25 MED ORDER — ZIPRASIDONE HCL 20 MG PO CAPS: 20.0000 mg | ORAL_CAPSULE | Freq: Two times a day (BID) | ORAL | 0 refills | Status: DC

## 2021-06-25 NOTE — Progress Notes (Signed)
Virtual Visit via Telephone Note  I connected with Rada Hay on 06/25/21 at  8:40 AM EDT by telephone and verified that I am speaking with the correct person using two identifiers.  Location: Patient: Home Provider: Home Office   I discussed the limitations, risks, security and privacy concerns of performing an evaluation and management service by telephone and the availability of in person appointments. I also discussed with the patient that there may be a patient responsible charge related to this service. The patient expressed understanding and agreed to proceed.   History of Present Illness: Patient is evaluated by phone session.  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  She was told to stop smoking and she is looking into options.  She wants to know if she can take Chantix as a good mood and she had tried that did not help her mood.  Overall she feels things are going well.  Since she received the corticosteroid injection in her knee, knee pain is much better.  She is walking and able to stand for a while.  She is pleased that she is able to cook for dinner.  We have cut down her Lamictal on the last visit as she is already taking gabapentin, Cymbalta, Geodon and trazodone.  So far she denies any irritability, mania, psychosis or any anger.  Since Geodon approved from insurance she is pleased and more relaxed.  She denies any panic attack or any anxiety attack.  She denies any highs and lows or any anger.  She is sleeping good with the trazodone.  She denies any shakes or tremors.  She is trying to lose weight but usually her weight.  Fluctuates.  She admitted moving to a new place had helped her a lot and she is now there for 9 months.  She denies drinking.  Past Psychiatric History: Reviewed H/O depression, hallucination, mania, abuse and anger. H/O overdose and multiple inpatient. Last inpatient in 2019. Tried Paxil, Klonopin, Prozac,  Wellbutrin,Topamax and trazodone.    Psychiatric Specialty Exam: Physical Exam  Review of Systems  Weight 285 lb (129.3 kg).There is no height or weight on file to calculate BMI.  General Appearance: NA  Eye Contact:  NA  Speech:  Normal Rate  Volume:  Normal  Mood:  Euthymic  Affect:  NA  Thought Process:  Goal Directed  Orientation:  Full (Time, Place, and Person)  Thought Content:  WDL  Suicidal Thoughts:  No  Homicidal Thoughts:  No  Memory:  Immediate;   Good Recent;   Good Remote;   Fair  Judgement:  Intact  Insight:  Present  Psychomotor Activity:  NA  Concentration:  Concentration: Fair and Attention Span: Fair  Recall:  Good  Fund of Knowledge:  Good  Language:  Good  Akathisia:  No  Handed:  Right  AIMS (if indicated):     Assets:  Communication Skills Desire for Improvement Housing Resilience Social Support  ADL's:  Intact  Cognition:  WNL  Sleep:   good      Assessment and Plan: Bipolar disorder type I.  Anxiety.  I reviewed blood work results.  In August she had a blood work and her BUN 14 and creatinine 0.79.  All other labs are normal.  We talk about Chantix that sometimes it can cause worsening of mood symptoms.  Since she had tried Wellbutrin that did not go well I recommend try to avoid Chantix.  We talk about nicotine patch to try from PCP.  Patient agreed to give a call to her PCP to talk more about nicotine patch.  I also recommend she can try to discontinue Lamictal as she is doing better on other medication.  She is a still taking Geodon 20 mg twice a day, trazodone 100 mg at bedtime and Cymbalta 60 mg daily.  Her PCP is giving gabapentin for hot flashes.  Recommended to call us back if there is any question or any concern.  Follow-up in 3 months.    Follow Up Instructions:    I discussed the assessment and treatment plan with the patient. The patient was provided an opportunity to ask questions and all were answered. The patient agreed with  the plan and demonstrated an understanding of the instructions.   The patient was advised to call back or seek an in-person evaluation if the symptoms worsen or if the condition fails to improve as anticipated.  I provided 19 minutes of non-face-to-face time during this encounter.   Kathlee Nations, MD

## 2021-09-11 ENCOUNTER — Telehealth (HOSPITAL_BASED_OUTPATIENT_CLINIC_OR_DEPARTMENT_OTHER): Payer: Medicare HMO | Admitting: Psychiatry

## 2021-09-11 ENCOUNTER — Encounter (HOSPITAL_COMMUNITY): Payer: Self-pay | Admitting: Psychiatry

## 2021-09-11 ENCOUNTER — Other Ambulatory Visit: Payer: Self-pay

## 2021-09-11 DIAGNOSIS — F319 Bipolar disorder, unspecified: Secondary | ICD-10-CM | POA: Diagnosis not present

## 2021-09-11 DIAGNOSIS — F419 Anxiety disorder, unspecified: Secondary | ICD-10-CM | POA: Diagnosis not present

## 2021-09-11 MED ORDER — TRAZODONE HCL 100 MG PO TABS: 100.0000 mg | ORAL_TABLET | Freq: Every day | ORAL | 0 refills | Status: DC

## 2021-09-11 MED ORDER — ZIPRASIDONE HCL 60 MG PO CAPS: 60.0000 mg | ORAL_CAPSULE | Freq: Every day | ORAL | 0 refills | Status: DC

## 2021-09-11 MED ORDER — DULOXETINE HCL 60 MG PO CPEP: 60.0000 mg | ORAL_CAPSULE | Freq: Two times a day (BID) | ORAL | 0 refills | Status: DC

## 2021-09-11 NOTE — Progress Notes (Signed)
Virtual Visit via Telephone Note  I connected with Janet Hester on 09/11/21 at 10:00 AM EST by telephone and verified that I am speaking with the correct person using two identifiers.  Location: Patient: Home Provider: Home Office   I discussed the limitations, risks, security and privacy concerns of performing an evaluation and management service by telephone and the availability of in person appointments. I also discussed with the patient that there may be a patient responsible charge related to this service. The patient expressed understanding and agreed to proceed.   History of Present Illness: Patient is evaluated by phone session.  Patient reported lately feeling very irritable, anxious and sad because her cat died last month.  She had a cat for 2 years and she reported it was her emotional support animal.  She admitted dysphoria, sometimes crying spells but denies any hallucination, paranoia or any mania.  She sleeps fair.  On the last visit we recommend to discontinue Lamictal however patient forgot to discontinue but start taking half a pill.  She is taking Geodon, trazodone.  Patient reported recently she had 6 teeth pulled out and she was in a lot of pain and given hydrocodone.  She has cut down the pain medicine but is still have some time pain.  She also takes gabapentin prescribed by her PCP which she takes at bedtime.  She denies any major panic attack,.  She was very busy on Thanksgiving as more than 20 people came to her place.  Her father came from [place], her daughter, grandchildren, niece and nephew were also present.  However she feels good that she handled things much better.  She denies any mania or psychosis.  She has not stopped smoking because she was very busy but thinking seriously to stop or cut down in the future.  Her appetite is okay.  Her weight is unchanged from the past.  She feels the Geodon helps her mood a lot and she does not get manic or impulsive behavior.   She has no tremors, shakes or any EPS.  Pst Psychiatric History: Reviewed H/O depression, hallucination, mania, abuse and anger. H/O overdose and multiple inpatient. Last inpatient in 2019. Tried Paxil, Klonopin, Prozac, Wellbutrin,Topamax and trazodone.    Psychiatric Specialty Exam: Physical Exam  Review of Systems  Weight 282 lb (127.9 kg).There is no height or weight on file to calculate BMI.  General Appearance: NA  Eye Contact:  NA  Speech:  Clear and Coherent  Volume:  Normal  Mood:  Dysphoric and Irritable  Affect:  NA  Thought Process:  Goal Directed  Orientation:  Full (Time, Place, and Person)  Thought Content:  WDL  Suicidal Thoughts:  No  Homicidal Thoughts:  No  Memory:  Immediate;   Good Recent;   Fair Remote;   Fair  Judgement:  Intact  Insight:  Shallow  Psychomotor Activity:  NA  Concentration:  Concentration: Fair and Attention Span: Fair  Recall:  Good  Fund of Knowledge:  Good  Language:  Good  Akathisia:  No  Handed:  Right  AIMS (if indicated):     Assets:  Communication Skills Desire for Improvement Housing Social Support  ADL's:  Intact  Cognition:  WNL  Sleep:   fair      Assessment and Plan: Bipolar disorder type I.  Anxiety.  Patient is still taking half a pill of Lamictal as she forgot to discontinue.  We talk about discontinue Lamictal and try going up on Geodon  as she feels Geodon helped her a lot.  We will move Geodon to take only at bedtime but increase the dose to 60 mg.  Patient has upcoming appointment for breathing test and then she has appointment with the pulmonologist.  Encourage to consider cigarette smoking which she has discussed in the past but unable to implement the plan.  Continue Cymbalta 60 mg twice a day, Geodon moved to take only at bedtime with 60 mg at bedtime, trazodone 100 mg at bedtime.  She is getting gabapentin 300 mg from her PCP for hot flashes.  Recommended to call us back if is any question or any concern.   Follow-up in 3 months.  Follow Up Instructions:    I discussed the assessment and treatment plan with the patient. The patient was provided an opportunity to ask questions and all were answered. The patient agreed with the plan and demonstrated an understanding of the instructions.   The patient was advised to call back or seek an in-person evaluation if the symptoms worsen or if the condition fails to improve as anticipated.  I provided 19 minutes of non-face-to-face time during this encounter.   Kathlee Nations, MD

## 2021-09-23 ENCOUNTER — Telehealth (HOSPITAL_COMMUNITY): Payer: Medicare HMO | Admitting: Psychiatry

## 2021-12-10 ENCOUNTER — Encounter (HOSPITAL_COMMUNITY): Payer: Self-pay | Admitting: Psychiatry

## 2021-12-10 ENCOUNTER — Other Ambulatory Visit: Payer: Self-pay

## 2021-12-10 ENCOUNTER — Telehealth (HOSPITAL_BASED_OUTPATIENT_CLINIC_OR_DEPARTMENT_OTHER): Payer: Medicare Other | Admitting: Psychiatry

## 2021-12-10 DIAGNOSIS — F319 Bipolar disorder, unspecified: Secondary | ICD-10-CM

## 2021-12-10 DIAGNOSIS — F419 Anxiety disorder, unspecified: Secondary | ICD-10-CM | POA: Diagnosis not present

## 2021-12-10 MED ORDER — TRAZODONE HCL 100 MG PO TABS: 100.0000 mg | ORAL_TABLET | Freq: Every day | ORAL | 0 refills | Status: DC

## 2021-12-10 MED ORDER — DULOXETINE HCL 60 MG PO CPEP: 60.0000 mg | ORAL_CAPSULE | Freq: Two times a day (BID) | ORAL | 0 refills | Status: DC

## 2021-12-10 MED ORDER — ZIPRASIDONE HCL 60 MG PO CAPS: 60.0000 mg | ORAL_CAPSULE | Freq: Every day | ORAL | 0 refills | Status: DC

## 2021-12-10 NOTE — Progress Notes (Signed)
Virtual Visit via Telephone Note  I connected with Janet Hester on 12/10/21 at  8:20 AM EST by telephone and verified that I am speaking with the correct person using two identifiers.  Location: Patient: Home Provider: Home Office   I discussed the limitations, risks, security and privacy concerns of performing an evaluation and management service by telephone and the availability of in person appointments. I also discussed with the patient that there may be a patient responsible charge related to this service. The patient expressed understanding and agreed to proceed.   History of Present Illness: Patient is evaluated by phone session.  She is now taking Geodon 60 mg at bedtime.  She is sleeping most of the nights much better but is still sometimes she has hot flashes for that she takes gabapentin.  She feels a current medicine is working and her mood is much stable.  Recently she had diagnosed with plantar fasciitis and given injection in her joint.  She has pain in her ankle.  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  She had checked with Social Security if she is able to work as she also get disability.  Patient was told that she can only work to a certain amount and she is happy to come back to work force.  Patient told it will not affect her disability.  She feels much better since he started working.  She also thinking to start [employer] to start exercise.  She feels rather than watching television all the time she like to be more productive.  She denies any mania, psychosis, hallucination.  She denies any anxiety or panic attack.  Her appetite is okay and her weight is unchanged from the past.  She lives by herself.  Recently she had changed her insurance and now she like to [facility]  Her insurance also covers [employer].  She has no tremors or shakes or any EPS.  She denies any impulsive  behavior.  Pst Psychiatric History: Reviewed H/O depression, hallucination, mania, abuse and anger. H/O overdose and multiple inpatient. Last inpatient in 2019. Tried Paxil, Klonopin, Prozac, Wellbutrin,Topamax and trazodone.    Psychiatric Specialty Exam: Physical Exam  Review of Systems  Weight 282 lb (127.9 kg).There is no height or weight on file to calculate BMI.  General Appearance: NA  Eye Contact:  NA  Speech:  Clear and Coherent and Normal Rate  Volume:  Normal  Mood:  Euthymic  Affect:  NA  Thought Process:  Goal Directed  Orientation:  Full (Time, Place, and Person)  Thought Content:  Logical  Suicidal Thoughts:  No  Homicidal Thoughts:  No  Memory:  Immediate;   Good Recent;   Good Remote;   Fair  Judgement:  Intact  Insight:  Present  Psychomotor Activity:  NA  Concentration:  Concentration: Good and Attention Span: Good  Recall:  Good  Fund of Knowledge:  Good  Language:  Good  Akathisia:  No  Handed:  Right  AIMS (if indicated):     Assets:  Communication Skills Desire for Improvement Housing Resilience Transportation  ADL's:  Intact  Cognition:  WNL  Sleep:   fair      Assessment and Plan: Bipolar disorder type I.  Anxiety.  Patient doing much better on her current medication.  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  She also planning to start [employer].  She has no major concern on her medication.  We will continue Geodon 60 mg at bedtime, trazodone 100 mg at bedtime and Cymbalta 60 mg twice a day.  She also taking gabapentin 300 mg from her PCP for hot flashes.  Recommended to call us back if is any question or any concern.  Follow-up in 3 months.  Follow Up Instructions:    I discussed the assessment and treatment plan with the patient. The patient was provided an opportunity to ask questions and all were answered. The patient agreed with the plan and demonstrated an understanding of the instructions.    The patient was advised to call back or seek an in-person evaluation if the symptoms worsen or if the condition fails to improve as anticipated.  I provided 21 minutes of non-face-to-face time during this encounter.   Kathlee Nations, MD

## 2021-12-11 ENCOUNTER — Telehealth (HOSPITAL_COMMUNITY): Payer: Medicare HMO | Admitting: Psychiatry

## 2022-01-15 ENCOUNTER — Telehealth (HOSPITAL_COMMUNITY): Payer: Self-pay | Admitting: *Deleted

## 2022-01-15 ENCOUNTER — Other Ambulatory Visit (HOSPITAL_COMMUNITY): Payer: Self-pay | Admitting: *Deleted

## 2022-01-15 MED ORDER — LORAZEPAM 0.5 MG PO TABS: ORAL_TABLET | ORAL | 0 refills | Status: DC

## 2022-01-15 NOTE — Telephone Encounter (Signed)
Writer spoke with pt who called to request something for anxiety as she is flying out to go on a cruise on 01/19/22 through to 01/25/22 and is extremely anxious about this. Pt states that she still has some Vistaril however it is not helping her anxiety at all. Pt ha san upcoming appointment on 02/18/22. Please review and advise.  ?

## 2022-01-15 NOTE — Telephone Encounter (Signed)
You can send one time 10 tab of ativan 0.5 mg to take as needed for severe anxiety. If she is ok than please call her pharmacy.  ? ?

## 2022-02-10 ENCOUNTER — Other Ambulatory Visit (HOSPITAL_COMMUNITY): Payer: Self-pay | Admitting: Psychiatry

## 2022-02-10 DIAGNOSIS — F419 Anxiety disorder, unspecified: Secondary | ICD-10-CM

## 2022-02-10 DIAGNOSIS — F319 Bipolar disorder, unspecified: Secondary | ICD-10-CM

## 2022-02-18 ENCOUNTER — Encounter (HOSPITAL_COMMUNITY): Payer: Self-pay | Admitting: Psychiatry

## 2022-02-18 ENCOUNTER — Telehealth (HOSPITAL_BASED_OUTPATIENT_CLINIC_OR_DEPARTMENT_OTHER): Payer: Medicare Other | Admitting: Psychiatry

## 2022-02-18 DIAGNOSIS — F319 Bipolar disorder, unspecified: Secondary | ICD-10-CM

## 2022-02-18 DIAGNOSIS — F419 Anxiety disorder, unspecified: Secondary | ICD-10-CM | POA: Diagnosis not present

## 2022-02-18 MED ORDER — ZIPRASIDONE HCL 60 MG PO CAPS: 60.0000 mg | ORAL_CAPSULE | Freq: Every day | ORAL | 0 refills | Status: DC

## 2022-02-18 MED ORDER — TRAZODONE HCL 100 MG PO TABS: 100.0000 mg | ORAL_TABLET | Freq: Every day | ORAL | 0 refills | Status: DC

## 2022-02-18 MED ORDER — DULOXETINE HCL 60 MG PO CPEP: 60.0000 mg | ORAL_CAPSULE | Freq: Two times a day (BID) | ORAL | 0 refills | Status: DC

## 2022-02-18 NOTE — Progress Notes (Signed)
Virtual Visit via Telephone Note ? ?I connected with Janet Hester on 02/18/22 at  8:20 AM EDT by telephone and verified that I am speaking with the correct person using two identifiers. ? ?Location: ?Patient: Home ?Provider: Home Office ?  ?I discussed the limitations, risks, security and privacy concerns of performing an evaluation and management service by telephone and the availability of in person appointments. I also discussed with the patient that there may be a patient responsible charge related to this service. The patient expressed understanding and agreed to proceed. ? ? ?History of Present Illness: ?Patient is evaluated by phone session.  She is very tired as recently came from [place] this morning.  She is having jet lag.  She had a good time at week as an earlier she had a cruise trip which she really enjoyed.  She reported things are going well.  She denies any mood swing, mania, anger, agitation.  She does feel sometimes anxious around people and crowded places.  She was given few tablets of Ativan for the trip which she took and that helps.  She is more relaxed since back home but now she is tired.  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  She is on disability but she had cleared herself to work from [employer].  Her appetite is okay.  Her weight is unchanged from the past.  She has no tremor or shakes or any EPS.  She lived by herself.  She like to keep her current medication.  She is active and try to go to [facility] regularly.  She denies drinking or using any illegal substances. ?  ?Pst Psychiatric History: Reviewed ?H/O depression, hallucination, mania, abuse and anger. H/O overdose and multiple inpatient. Last inpatient in 2019. Tried Paxil, Klonopin, Prozac, Wellbutrin,Topamax and trazodone.  ? ?Psychiatric Specialty Exam: ?Physical Exam  ?Review of Systems  ?Weight 285 lb (129.3 kg).There is no height or weight on file to calculate  BMI.  ?General Appearance: NA  ?Eye Contact:  NA  ?Speech:  Slow  ?Volume:  Normal  ?Mood:   tired  ?Affect:  NA  ?Thought Process:  Goal Directed  ?Orientation:  Full (Time, Place, and Person)  ?Thought Content:  Logical  ?Suicidal Thoughts:  No  ?Homicidal Thoughts:  No  ?Memory:  Immediate;   Good ?Recent;   Good ?Remote;   Good  ?Judgement:  Intact  ?Insight:  Present  ?Psychomotor Activity:  NA  ?Concentration:  Concentration: Good and Attention Span: Good  ?Recall:  Good  ?Fund of Knowledge:  Good  ?Language:  Good  ?Akathisia:  No  ?Handed:  Right  ?AIMS (if indicated):     ?Assets:  Communication Skills ?Desire for Improvement ?Financial Resources/Insurance ?Housing ?Resilience ?Social Support  ?ADL's:  Intact  ?Cognition:  WNL  ?Sleep:   fair due to jet lag  ? ? ? ? ?Assessment and Plan: ?Bipolar disorder type I.  Anxiety. ? ?Patient is stable on her current medication.  She has no concerns or any side effects.  Continue Cymbalta 60 mg twice a day, Geodon 60 mg at bedtime and trazodone 100 mg at bedtime.  She is also taking gabapentin from PCP for hot flashes.  Recommended to call us back if she has any question, concern if she feels worsening of the symptoms.  Follow up in 3 months. ? ?Follow Up Instructions: ? ?  ?I discussed the assessment and treatment plan  with the patient. The patient was provided an opportunity to ask questions and all were answered. The patient agreed with the plan and demonstrated an understanding of the instructions. ?  ?The patient was advised to call back or seek an in-person evaluation if the symptoms worsen or if the condition fails to improve as anticipated. ? ?Collaboration of Care: Other provider involved in patient's care AEB notes are in available in epic to review. ? ?Patient/Guardian was advised Release of Information must be obtained prior to any record release in order to collaborate their care with an outside provider. Patient/Guardian was advised if they have not  already done so to contact the registration department to sign all necessary forms in order for [place] to release information regarding their care.  ? ?Consent: Patient/Guardian gives verbal consent for treatment and assignment of benefits for services provided during this visit. Patient/Guardian expressed understanding and agreed to proceed.   ? ?I provided 20 minutes of non-face-to-face time during this encounter. ? ? ?Kathlee Nations, MD  ?

## 2022-02-19 ENCOUNTER — Telehealth (HOSPITAL_COMMUNITY): Payer: Self-pay | Admitting: *Deleted

## 2022-02-19 NOTE — Telephone Encounter (Signed)
PA FOR ZIPRASIDONE 60 MG CAPS HAS BEEN DENIED BY [employer]. ? ?DENIAL DUE TO NOT BEING USED AS ADJUNCTIVE THERAPY FOR BI POLAR 1 TO LITHIUM OR DEPAKOTE, OR BEING USED AS MONOTHERAPY FOR SCHIZOPHRENIA.  ? ?I WILL TRY TO APPEAL.  ?

## 2022-02-19 NOTE — Telephone Encounter (Signed)
PA FOR GEODON 60 MG SUBMITTED TO [employer] [facility]. ? ?PA REFERENCE # [id number]. ? ?AWAITING DETERMINATION. ?

## 2022-02-25 ENCOUNTER — Telehealth (HOSPITAL_COMMUNITY): Payer: Self-pay | Admitting: *Deleted

## 2022-02-25 NOTE — Telephone Encounter (Signed)
APPEAL FOR ZIPRASIDONE 60 MG CAPS FAXED TO [employer] PART D APPEALS NAD GRIEVANCE DEPARTMENT. EXPEDITED REVIEW REQUESTED.  ? ?AWAITING DETERMINATION. ?

## 2022-02-26 ENCOUNTER — Telehealth (HOSPITAL_COMMUNITY): Payer: Self-pay | Admitting: *Deleted

## 2022-02-26 NOTE — Telephone Encounter (Signed)
Writer spoke with pt who wants to start medication in place of the Geodon as it is too expensive. This nurse has done PA and two appeals for the Geodon, all denied. Pt verbalizes understanding and states that she wants to try something else anyway as she is tired of fighting with insurance company. Next appointment 05/20/22. Please review and advise.  ?

## 2022-02-27 ENCOUNTER — Telehealth (HOSPITAL_COMMUNITY): Payer: Self-pay | Admitting: *Deleted

## 2022-02-27 NOTE — Telephone Encounter (Signed)
Writer spoke with pt today regarding possibly restarting Lamictal and pt informed that she had just gotten her medication package from [place], and the Geodon was included. Appeal has been approved then.

## 2022-02-27 NOTE — Telephone Encounter (Signed)
She is on Geodon since last year.  Not sure why it is denied.  Continue to check what other medicines are approved from her insurance.  1 option is that she can go back on Lamictal.

## 2022-02-27 NOTE — Telephone Encounter (Signed)
Thanks for the update

## 2022-03-06 ENCOUNTER — Telehealth (HOSPITAL_COMMUNITY): Payer: Medicare Other | Admitting: Psychiatry

## 2022-03-11 ENCOUNTER — Telehealth (HOSPITAL_COMMUNITY): Payer: Medicare Other | Admitting: Psychiatry

## 2022-04-29 ENCOUNTER — Other Ambulatory Visit (HOSPITAL_COMMUNITY): Payer: Self-pay | Admitting: Psychiatry

## 2022-04-29 DIAGNOSIS — F319 Bipolar disorder, unspecified: Secondary | ICD-10-CM

## 2022-04-29 DIAGNOSIS — F419 Anxiety disorder, unspecified: Secondary | ICD-10-CM

## 2022-05-20 ENCOUNTER — Telehealth (HOSPITAL_BASED_OUTPATIENT_CLINIC_OR_DEPARTMENT_OTHER): Payer: Medicare Other | Admitting: Psychiatry

## 2022-05-20 ENCOUNTER — Encounter (HOSPITAL_COMMUNITY): Payer: Self-pay | Admitting: Psychiatry

## 2022-05-20 DIAGNOSIS — F319 Bipolar disorder, unspecified: Secondary | ICD-10-CM | POA: Diagnosis not present

## 2022-05-20 DIAGNOSIS — F419 Anxiety disorder, unspecified: Secondary | ICD-10-CM

## 2022-05-20 MED ORDER — TRAZODONE HCL 100 MG PO TABS: 100.0000 mg | ORAL_TABLET | Freq: Every day | ORAL | 0 refills | Status: DC

## 2022-05-20 MED ORDER — ZIPRASIDONE HCL 80 MG PO CAPS: 80.0000 mg | ORAL_CAPSULE | Freq: Every day | ORAL | 1 refills | Status: DC

## 2022-05-20 MED ORDER — DULOXETINE HCL 60 MG PO CPEP: 60.0000 mg | ORAL_CAPSULE | Freq: Two times a day (BID) | ORAL | 0 refills | Status: DC

## 2022-05-20 NOTE — Progress Notes (Signed)
Virtual Visit via Telephone Note  I connected with Janet Hester on 05/20/22 at  8:20 AM EDT by telephone and verified that I am speaking with the correct person using two identifiers.  Location: Patient: Home Provider: Home Office   I discussed the limitations, risks, security and privacy concerns of performing an evaluation and management service by telephone and the availability of in person appointments. I also discussed with the patient that there may be a patient responsible charge related to this service. The patient expressed understanding and agreed to proceed.   History of Present Illness: Patient is evaluated by phone session.  She reported lately having irritability, crying spells, agitated and getting frustrated.  She is not sure what triggered but recall her cat died in 02-13-23.  Patient did not mention in her last session about her.  Patient told he was her support animal and she noticed lately more withdrawn, isolated and easy to get upset.  Patient told her daughter also noticed that her mood is unstable.  She sleeps good with the help of trazodone and Geodon.  She continues to work from home 40 hours a week for [employer] and she does not feel the job is as stressful.  She is on disability.  She lives by herself.  She is not sure if she will consider getting another cat in the future.  She told she may consider in October.  Patient denies any hallucination, paranoia, suicidal thoughts.  She started therapy with Dr. Irineo Axon at [facility].  She does not feel the current medicine working.  She is taking Geodon 60 mg in the evening, trazodone at bedtime and Cymbalta twice a day.  She is also taking gabapentin to help her hot flashes.  She reported her appetite is okay and her energy level is good.   Pst Psychiatric History: Reviewed H/O depression, hallucination, mania, abuse and anger. H/O overdose and multiple inpatient. Last inpatient in 2019. Tried Paxil, Klonopin,  Prozac, Wellbutrin,Topamax and trazodone.   Psychiatric Specialty Exam: Physical Exam  Review of Systems  Weight 285 lb (129.3 kg).Body mass index is 38.65 kg/m.  General Appearance: NA  Eye Contact:  NA  Speech:  Slow  Volume:  Decreased  Mood:  Depressed, Dysphoric, and Irritable  Affect:  NA  Thought Process:  Goal Directed  Orientation:  Full (Time, Place, and Person)  Thought Content:  Rumination  Suicidal Thoughts:  No  Homicidal Thoughts:  No  Memory:  Immediate;   Good Recent;   Good Remote;   Good  Judgement:  Intact  Insight:  Present  Psychomotor Activity:  NA  Concentration:  Concentration: Fair and Attention Span: Fair  Recall:  Good  Fund of Knowledge:  Good  Language:  Good  Akathisia:  No  Handed:  Right  AIMS (if indicated):     Assets:  Communication Skills Desire for Improvement Housing Social Support Transportation  ADL's:  Intact  Cognition:  WNL  Sleep:   7-8 hrs     Assessment and Plan: Bipolar disorder type I.  Anxiety.  Patient symptoms are worsening in recent months.  She started therapy at [facility] with Irineo Axon.  I reviewed her past medication.  She was taking Lamictal however it was discontinued due to polypharmacy.  Patient's cat died in Feb 13, 2023 and that may have triggered her symptoms.  We talk about optimizing the dose of Geodon 80 mg at bedtime however if it did not help then we may consider restarting  the Lamictal.  I also encourage continued therapy with her therapist at [facility].  Discussed safety concerns at any time having active suicidal thoughts or homicidal thought that she need to call 911 or go to local emergency room.  We will follow up in 6 weeks.  Follow Up Instructions:    I discussed the assessment and treatment plan with the patient. The patient was provided an opportunity to ask questions and all were answered. The patient agreed with the plan and demonstrated an understanding of the instructions.   The  patient was advised to call back or seek an in-person evaluation if the symptoms worsen or if the condition fails to improve as anticipated.  Collaboration of Care: Other provider involved in patient's care AEB notes are in epic to review  Patient/Guardian was advised Release of Information must be obtained prior to any record release in order to collaborate their care with an outside provider. Patient/Guardian was advised if they have not already done so to contact the registration department to sign all necessary forms in order for [place] to release information regarding their care.   Consent: Patient/Guardian gives verbal consent for treatment and assignment of benefits for services provided during this visit. Patient/Guardian expressed understanding and agreed to proceed.    I provided 30 minutes of non-face-to-face time during this encounter.   Kathlee Nations, MD

## 2022-06-11 ENCOUNTER — Other Ambulatory Visit (HOSPITAL_COMMUNITY): Payer: Self-pay | Admitting: Psychiatry

## 2022-06-11 DIAGNOSIS — F319 Bipolar disorder, unspecified: Secondary | ICD-10-CM

## 2022-07-08 ENCOUNTER — Telehealth (HOSPITAL_COMMUNITY): Payer: Medicare Other | Admitting: Psychiatry

## 2022-07-09 ENCOUNTER — Other Ambulatory Visit (HOSPITAL_COMMUNITY): Payer: Self-pay | Admitting: Psychiatry

## 2022-07-09 DIAGNOSIS — F319 Bipolar disorder, unspecified: Secondary | ICD-10-CM

## 2022-07-11 ENCOUNTER — Encounter (HOSPITAL_COMMUNITY): Payer: Self-pay | Admitting: Psychiatry

## 2022-07-11 ENCOUNTER — Telehealth (HOSPITAL_BASED_OUTPATIENT_CLINIC_OR_DEPARTMENT_OTHER): Payer: Medicare Other | Admitting: Psychiatry

## 2022-07-11 DIAGNOSIS — F419 Anxiety disorder, unspecified: Secondary | ICD-10-CM | POA: Diagnosis not present

## 2022-07-11 DIAGNOSIS — F319 Bipolar disorder, unspecified: Secondary | ICD-10-CM | POA: Diagnosis not present

## 2022-07-11 MED ORDER — TRAZODONE HCL 100 MG PO TABS: 100.0000 mg | ORAL_TABLET | Freq: Every day | ORAL | 0 refills | Status: DC

## 2022-07-11 MED ORDER — LAMOTRIGINE 100 MG PO TABS: ORAL_TABLET | ORAL | 0 refills | Status: DC

## 2022-07-11 MED ORDER — DULOXETINE HCL 60 MG PO CPEP: 60.0000 mg | ORAL_CAPSULE | Freq: Two times a day (BID) | ORAL | 0 refills | Status: DC

## 2022-07-11 MED ORDER — ZIPRASIDONE HCL 80 MG PO CAPS: 80.0000 mg | ORAL_CAPSULE | Freq: Every day | ORAL | 0 refills | Status: DC

## 2022-07-11 NOTE — Progress Notes (Signed)
Virtual Visit via Telephone Note  I connected with Janet Hester on 07/11/22 at  8:20 AM EDT by telephone and verified that I am speaking with the correct person using two identifiers.  Location: Patient: Home Provider: Home Office   I discussed the limitations, risks, security and privacy concerns of performing an evaluation and management service by telephone and the availability of in person appointments. I also discussed with the patient that there may be a patient responsible charge related to this service. The patient expressed understanding and agreed to proceed.   History of Present Illness: Patient is evaluated by phone session.  On the last visit we increased Geodon 80 mg.  She noticed improvement in her sleep, crying spells and depression but is still have a lot of irritability and anger.  She is working at [employer] 32 hours a week and there are days that she has to leave the work because she was very irritable.  She is sleeping better.  She is also on disability.  Patient told her daughter noticed that she gets easily frustrated.  She denies any hallucination, suicidal thoughts or paranoia.  She has not got any new cat yet but her plan is to get one soon.  She is in therapy with Dr. Irineo Axon at [facility].  Her blood pressure is better since her blood pressure medicine dose adjusted.  She also taking gabapentin for hot flashes from her PCP.  She is compliant with Geodon, Cymbalta, trazodone.  She denies any panic attack.  She denies drinking or using any illegal substances.  She lives by herself.  She had a good support from her family members.  Her father also checks on her on a regular basis.  Pst Psychiatric History: Reviewed H/O depression, hallucination, mania, abuse and anger. H/O overdose and multiple inpatient. Last inpatient in 2019. Tried Paxil, Klonopin, Prozac, Wellbutrin,Topamax and trazodone.    Psychiatric Specialty Exam: Physical Exam  Review of  Systems  Weight 283 lb (128.4 kg).There is no height or weight on file to calculate BMI.  General Appearance: NA  Eye Contact:  NA  Speech:  Normal Rate  Volume:  Normal  Mood:  Dysphoric and Irritable  Affect:  NA  Thought Process:  Goal Directed  Orientation:  Full (Time, Place, and Person)  Thought Content:  Rumination  Suicidal Thoughts:  No  Homicidal Thoughts:  No  Memory:  Immediate;   Good Recent;   Good Remote;   Fair  Judgement:  Intact  Insight:  Present  Psychomotor Activity:  NA  Concentration:  Concentration: Fair and Attention Span: Fair  Recall:  [employer] of Knowledge:  Good  Language:  Good  Akathisia:  No  Handed:  Right  AIMS (if indicated):     Assets:  Communication Skills Desire for Improvement Housing Social Support Transportation  ADL's:  Intact  Cognition:  WNL  Sleep:   better      Assessment and Plan: Bipolar disorder type I.  Anxiety.  Discuss current symptoms and medication.  Patient like to go back on Lamictal which had helped her in the past.  However due to polypharmacy we have discontinued.  She noticed increase Geodon to help her sleep and paranoia but still have a lot of mood lability.  She used to take Lamictal 100 mg.  We will start Lamictal 50 mg for 2 weeks and then 100 mg daily.  I recommend if 50 mg does help then she can keep the  50 mg and then going up to 100 mg.  Again discussed polypharmacy especially taking Geodon, trazodone, Cymbalta.  She also taking gabapentin from other provider for heart pressures.  Patient agreed with the plan.  We will continue Geodon 80 mg at bedtime, Cymbalta 60 mg twice daily, trazodone 100 mg at bedtime and restarting Lamictal.  Reinforce to watch side effect specially rash and itching.  Patient do not recall any side effects when she took the Lamictal in the past.  Encouraged to continue therapy with Irineo Axon.  Recommended to call us back if she has any question or any concern.  Follow-up in 3  months.  Follow Up Instructions:    I discussed the assessment and treatment plan with the patient. The patient was provided an opportunity to ask questions and all were answered. The patient agreed with the plan and demonstrated an understanding of the instructions.   The patient was advised to call back or seek an in-person evaluation if the symptoms worsen or if the condition fails to improve as anticipated.  Collaboration of Care: Other provider involved in patient's care AEB notes are available in epic to review.  Patient/Guardian was advised Release of Information must be obtained prior to any record release in order to collaborate their care with an outside provider. Patient/Guardian was advised if they have not already done so to contact the registration department to sign all necessary forms in order for [place] to release information regarding their care.   Consent: Patient/Guardian gives verbal consent for treatment and assignment of benefits for services provided during this visit. Patient/Guardian expressed understanding and agreed to proceed.    I provided 27 minutes of non-face-to-face time during this encounter.   Kathlee Nations, MD

## 2022-09-09 ENCOUNTER — Other Ambulatory Visit (HOSPITAL_COMMUNITY): Payer: Self-pay | Admitting: Psychiatry

## 2022-09-09 DIAGNOSIS — F319 Bipolar disorder, unspecified: Secondary | ICD-10-CM

## 2022-09-10 ENCOUNTER — Other Ambulatory Visit (HOSPITAL_COMMUNITY): Payer: Self-pay | Admitting: Psychiatry

## 2022-09-10 DIAGNOSIS — F319 Bipolar disorder, unspecified: Secondary | ICD-10-CM

## 2022-09-16 ENCOUNTER — Other Ambulatory Visit (HOSPITAL_COMMUNITY): Payer: Self-pay | Admitting: Psychiatry

## 2022-09-16 DIAGNOSIS — F419 Anxiety disorder, unspecified: Secondary | ICD-10-CM

## 2022-09-16 DIAGNOSIS — F319 Bipolar disorder, unspecified: Secondary | ICD-10-CM

## 2022-09-29 ENCOUNTER — Telehealth (HOSPITAL_COMMUNITY): Payer: Medicare Other | Admitting: Psychiatry

## 2022-09-30 ENCOUNTER — Telehealth (HOSPITAL_COMMUNITY): Payer: Medicare Other | Admitting: Psychiatry

## 2022-10-14 ENCOUNTER — Telehealth (HOSPITAL_COMMUNITY): Payer: Self-pay | Admitting: *Deleted

## 2022-10-14 DIAGNOSIS — F419 Anxiety disorder, unspecified: Secondary | ICD-10-CM

## 2022-10-14 DIAGNOSIS — F319 Bipolar disorder, unspecified: Secondary | ICD-10-CM

## 2022-10-14 NOTE — Telephone Encounter (Signed)
Please call her bridge prescription until her next appointment.

## 2022-10-14 NOTE — Telephone Encounter (Signed)
Patient called asking for refill of Lamictal and Geodon. Next appointment 2/14 and will not have enough. Message sent to MD

## 2022-10-21 MED ORDER — LAMOTRIGINE 100 MG PO TABS: ORAL_TABLET | ORAL | 0 refills | Status: DC

## 2022-10-21 MED ORDER — ZIPRASIDONE HCL 80 MG PO CAPS: 80.0000 mg | ORAL_CAPSULE | Freq: Every day | ORAL | 0 refills | Status: DC

## 2022-10-21 NOTE — Telephone Encounter (Signed)
Medication refill - Telephone call with patient to follow up on her requested needed refills of Lamictal and Geodon and patient requested 90 day orders and for this to be sent to her [place] as for what is covered with her insurance. Agreed to send request to Dr. Adele Schilder for him to send in 90 day orders for both needed refills and patient stated plan to keep appointment on 11/26/22 after missing appointment 09/30/22.

## 2022-10-21 NOTE — Telephone Encounter (Signed)
Medication management - Telephone call message left for patient that Dr. Adele Schilder approved a new 90 day order for her Lamictal and Geodon and reminded patient to keep her nezt appointment on 11/26/22. New orders e-scribed to patient's [facility] Delivery for her prescribed Lamictal 100 mg and Geodon 80 mg orders as authorized and provided by Dr. Adele Schilder this date.

## 2022-10-21 NOTE — Telephone Encounter (Signed)
We usually do not give a 90-day prescription if patient missed appointment.  It is okay to provide one time 90-day prescription however she need to keep the appointment in the future to get full prescription otherwise it will be a bridge prescription.  Yes go ahead and provide a 90-day prescription

## 2022-10-21 NOTE — Addendum Note (Signed)
Addended by: Watt Climes on: 10/21/2022 01:11 PM   Modules accepted: Orders

## 2022-11-24 ENCOUNTER — Other Ambulatory Visit (HOSPITAL_COMMUNITY): Payer: Self-pay | Admitting: Psychiatry

## 2022-11-24 DIAGNOSIS — F419 Anxiety disorder, unspecified: Secondary | ICD-10-CM

## 2022-11-26 ENCOUNTER — Encounter (HOSPITAL_COMMUNITY): Payer: Self-pay | Admitting: Psychiatry

## 2022-11-26 ENCOUNTER — Telehealth (HOSPITAL_BASED_OUTPATIENT_CLINIC_OR_DEPARTMENT_OTHER): Payer: Medicare Other | Admitting: Psychiatry

## 2022-11-26 VITALS — Wt 289.0 lb

## 2022-11-26 DIAGNOSIS — F419 Anxiety disorder, unspecified: Secondary | ICD-10-CM

## 2022-11-26 DIAGNOSIS — F5101 Primary insomnia: Secondary | ICD-10-CM

## 2022-11-26 DIAGNOSIS — F319 Bipolar disorder, unspecified: Secondary | ICD-10-CM | POA: Diagnosis not present

## 2022-11-26 MED ORDER — TRAZODONE HCL 100 MG PO TABS: 100.0000 mg | ORAL_TABLET | Freq: Every day | ORAL | 0 refills | Status: DC

## 2022-11-26 MED ORDER — ZIPRASIDONE HCL 80 MG PO CAPS: 80.0000 mg | ORAL_CAPSULE | Freq: Every day | ORAL | 0 refills | Status: DC

## 2022-11-26 MED ORDER — DULOXETINE HCL 60 MG PO CPEP: 60.0000 mg | ORAL_CAPSULE | Freq: Two times a day (BID) | ORAL | 0 refills | Status: DC

## 2022-11-26 MED ORDER — LAMOTRIGINE 100 MG PO TABS: ORAL_TABLET | ORAL | 0 refills | Status: DC

## 2022-11-26 NOTE — Progress Notes (Signed)
Virtual Visit via Telephone Note  I connected with Janet Hester on 11/26/22 at  8:20 AM EST by telephone and verified that I am speaking with the correct person using two identifiers.  Location: Patient: Home Provider: Home Office   I discussed the limitations, risks, security and privacy concerns of performing an evaluation and management service by telephone and the availability of in person appointments. I also discussed with the patient that there may be a patient responsible charge related to this service. The patient expressed understanding and agreed to proceed.   History of Present Illness: Patient is evaluated by phone session.  She apologized missing last appointment.  She reported things are going well.  She had a good Christmas and holidays she spent time with the family.  She liked the Lamictal that is helping her mood irritability and anger, mania.  She has no rash or any itching.  She reported having headaches lately but now taking Imitrex that helps.  She also reported her daughter had shingles but she is glad that treatment is completed and doing much better.  She sleeps most of the night good.  She admitted few pounds weight gain because work from home and only go outside when she need some groceries.  She has appointment coming up in April with PCP.  Patient reported job is going very well.  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  She also on disability but Social Security allow certain hours and she is happy about it.  She denies any hallucination, paranoia, suicidal thoughts.  She is in therapy with Irineo Axon but not sure as not comfortable and like to try a new therapist.  She has no tremors, shakes or any EPS.  She had a good support from her family member.  She lives by herself and her daughter.  Patient denies any panic attack.  She denies any mania.  Pst Psychiatric History: Reviewed H/O depression, hallucination, mania, abuse and anger. H/O  overdose and multiple inpatient. Last inpatient in 2019. Tried Paxil, Klonopin, Prozac, Wellbutrin,Topamax and trazodone.    Psychiatric Specialty Exam: Physical Exam  Review of Systems  Weight 289 lb (131.1 kg).There is no height or weight on file to calculate BMI.  General Appearance: NA  Eye Contact:  NA  Speech:  Clear and Coherent and Normal Rate  Volume:  Normal  Mood:  Euthymic  Affect:  Appropriate  Thought Process:  Goal Directed  Orientation:  Full (Time, Place, and Person)  Thought Content:  WDL  Suicidal Thoughts:  No  Homicidal Thoughts:  No  Memory:  Immediate;   Good Recent;   Good Remote;   Good  Judgement:  Good  Insight:  Good  Psychomotor Activity:  NA  Concentration:  Concentration: Good and Attention Span: Good  Recall:  Good  Fund of Knowledge:  Good  Language:  Good  Akathisia:  No  Handed:  Right  AIMS (if indicated):     Assets:  Communication Skills Desire for Improvement Housing Resilience Transportation  ADL's:  Intact  Cognition:  WNL  Sleep:         Assessment and Plan: 1. Anxiety Patient anxiety is a stable.  Continue Cymbalta 60 mg 2 times a day.  She is no longer taking Ativan.  2. Bipolar I disorder ([place]) Patient doing better since started the Lamictal.  She had a good response in the past with the Lamictal but it was discontinued due to polypharmacy.  Now she is taking 100 mg and her mood and mania is a stable.  Continue Geodon 80 mg at bedtime.  3.  Primary insomnia Sleep is improved.  Continue trazodone 100 mg at bedtime.  Discussed medication side effects and benefits.  Recommended to call us back if she has any questions or any concerns.  Follow-up in 3 months.  Patient like to have a referral for therapy since she is not comfortable with her current therapist.  Follow Up Instructions:    I discussed the assessment and treatment plan with the patient. The patient was provided an opportunity to ask questions and all were  answered. The patient agreed with the plan and demonstrated an understanding of the instructions.   The patient was advised to call back or seek an in-person evaluation if the symptoms worsen or if the condition fails to improve as anticipated.  Collaboration of Care: Other provider involved in patient's care AEB notes are available in epic to review.  Patient/Guardian was advised Release of Information must be obtained prior to any record release in order to collaborate their care with an outside provider. Patient/Guardian was advised if they have not already done so to contact the registration department to sign all necessary forms in order for [place] to release information regarding their care.   Consent: Patient/Guardian gives verbal consent for treatment and assignment of benefits for services provided during this visit. Patient/Guardian expressed understanding and agreed to proceed.    I provided 20 minutes of non-face-to-face time during this encounter.   Kathlee Nations, MD

## 2022-12-15 ENCOUNTER — Ambulatory Visit (HOSPITAL_COMMUNITY): Payer: Medicare Other | Admitting: Clinical

## 2022-12-30 ENCOUNTER — Telehealth (HOSPITAL_COMMUNITY): Payer: Self-pay | Admitting: Psychiatry

## 2022-12-30 ENCOUNTER — Telehealth (HOSPITAL_COMMUNITY): Payer: Self-pay | Admitting: *Deleted

## 2022-12-30 NOTE — Telephone Encounter (Signed)
Writer LVM for pt advising that jury duty letter, excuse, went out in the mail this morning. Pt encouraged to call with any further questions or concerns.

## 2022-12-30 NOTE — Telephone Encounter (Signed)
Patient called into the office stating that she has left 3 messages on the nurse line in the past 10 days asking if she could get a letter from you stating that she cannot serve jury duty due to her mental status. Please advise.

## 2023-01-21 ENCOUNTER — Other Ambulatory Visit (HOSPITAL_COMMUNITY): Payer: Self-pay | Admitting: Psychiatry

## 2023-01-21 DIAGNOSIS — F5101 Primary insomnia: Secondary | ICD-10-CM

## 2023-01-21 DIAGNOSIS — F319 Bipolar disorder, unspecified: Secondary | ICD-10-CM

## 2023-02-18 ENCOUNTER — Other Ambulatory Visit (HOSPITAL_COMMUNITY): Payer: Self-pay | Admitting: Psychiatry

## 2023-02-18 DIAGNOSIS — F419 Anxiety disorder, unspecified: Secondary | ICD-10-CM

## 2023-02-25 ENCOUNTER — Encounter (HOSPITAL_COMMUNITY): Payer: Self-pay | Admitting: Psychiatry

## 2023-02-25 ENCOUNTER — Telehealth (HOSPITAL_BASED_OUTPATIENT_CLINIC_OR_DEPARTMENT_OTHER): Payer: Medicare Other | Admitting: Psychiatry

## 2023-02-25 VITALS — Wt 275.0 lb

## 2023-02-25 DIAGNOSIS — F419 Anxiety disorder, unspecified: Secondary | ICD-10-CM

## 2023-02-25 DIAGNOSIS — F5101 Primary insomnia: Secondary | ICD-10-CM | POA: Diagnosis not present

## 2023-02-25 DIAGNOSIS — F319 Bipolar disorder, unspecified: Secondary | ICD-10-CM | POA: Diagnosis not present

## 2023-02-25 MED ORDER — DULOXETINE HCL 60 MG PO CPEP
60.0000 mg | ORAL_CAPSULE | Freq: Two times a day (BID) | ORAL | 0 refills | Status: DC
Start: 2023-02-25 — End: 2023-08-11

## 2023-02-25 MED ORDER — LAMOTRIGINE 100 MG PO TABS: ORAL_TABLET | ORAL | 0 refills | Status: DC

## 2023-02-25 MED ORDER — ZIPRASIDONE HCL 80 MG PO CAPS: 80.0000 mg | ORAL_CAPSULE | Freq: Every day | ORAL | 0 refills | Status: DC

## 2023-02-25 MED ORDER — TRAZODONE HCL 100 MG PO TABS: 100.0000 mg | ORAL_TABLET | Freq: Every day | ORAL | 0 refills | Status: DC

## 2023-02-25 NOTE — Progress Notes (Signed)
[facility] MD Virtual Progress Note   Patient Location: Home Provider Location: Home Office  I connect with patient by telephone and verified that I am speaking with correct person by using two identifiers. I discussed the limitations of evaluation and management by telemedicine and the availability of in person appointments. I also discussed with the patient that there may be a patient responsible charge related to this service. The patient expressed understanding and agreed to proceed.  Janet Hester [id number] 57 y.o.  02/25/2023 8:32 AM  History of Present Illness:  Patient is evaluated by phone session.  She reported started to watching her calorie intake since daughter moved out after 6 months staying with her.  Patient told she had gained excessive weight while she was staying because she was eating unhealthy food.  She reported her symptoms are stable.  She denies any mania, psychosis, hallucination.  Patient has blood work done by her primary care doctor.  She is taking headache medicine which recently increased and more frequent.  She tried propranolol and then Imitrex but did not help.  Now she is taking to contact her primary care to get a neurology referral.  She feels the Lamictal helps a lot.  She denies any mania, anger, irritability.  She reported job is slow but going well.  She works for a [employer] from home.  She has no shakes, tremors, rash or any itching.  She admitted not seeing a therapist because she is not comfortable as she tried multiple therapist.  She still going to try the therapist that she feels comfortable and able to connect.  She also going to start program [facility] for weight loss.  She started walking every day.  Patient lives by herself.  She lives on the third floor and sometime does not go outside every day but trying to make sure at least to 3 times she would leave her place and go down.  She sleeps much better with a  combination of trazodone.  She denies any excessive buying, spending, anger, agitation.  She denies any suicidal thoughts.  She is compliant with medication.  She has not taken Ativan in more than 6 months.  Past Psychiatric History: H/O depression, hallucination, mania, abuse and anger. H/O overdose and multiple inpatient. Last inpatient in 2019. Tried Paxil, Klonopin, Prozac, Wellbutrin,Topamax and trazodone.     Outpatient Encounter Medications as of 02/25/2023  Medication Sig   atorvastatin (LIPITOR) 20 MG tablet Take by mouth.   cetirizine (ZYRTEC) 10 MG tablet Take 10 mg by mouth daily as needed for allergies.   DULoxetine (CYMBALTA) 60 MG capsule Take 1 capsule (60 mg total) by mouth 2 (two) times daily.   EPINEPHrine 0.3 mg/0.3 mL IJ SOAJ injection INJECT 0.3 MLS (0.3MG  TOTAL) INTO THE MUSCLE ONCE AS NEEDED FOR UP TO 1 DOSE FOR ANAPHYLAXIS   gabapentin (NEURONTIN) 300 MG capsule Take 300 mg by mouth at bedtime.   hydrOXYzine (ATARAX/VISTARIL) 25 MG tablet Take 1 tablet (25 mg total) by mouth as needed for anxiety (for severe anxiety). (Patient not taking: Reported on 09/27/2020)   lamoTRIgine (LAMICTAL) 100 MG tablet Take one tab daily   LORazepam (ATIVAN) 0.5 MG tablet Take one tablet (0.5 mg total) by mouth once daily as needed for severe anxiety. (Patient not taking: Reported on 07/11/2022)   losartan (COZAAR) 100 MG tablet Take 100 mg by mouth daily.   Omeprazole 20 MG TBEC Take 1 tablet by mouth daily for 6 weeks.  rivaroxaban (XARELTO) 20 MG TABS tablet Take 1 tablet (20 mg total) by mouth daily with supper.   traZODone (DESYREL) 100 MG tablet Take 1 tablet (100 mg total) by mouth at bedtime.   ziprasidone (GEODON) 80 MG capsule Take 1 capsule (80 mg total) by mouth at bedtime.   No facility-administered encounter medications on file as of 02/25/2023.    No results found for this or any previous visit (from the past 2160 hour(s)).   Psychiatric Specialty Exam: Physical Exam   Review of Systems  Constitutional:  Positive for appetite change.  Neurological:  Positive for headaches.    Weight 275 lb (124.7 kg).There is no height or weight on file to calculate BMI.  General Appearance: NA  Eye Contact:  NA  Speech:  Clear and Coherent and Normal Rate  Volume:  Normal  Mood:  Euthymic  Affect:  NA  Thought Process:  Goal Directed  Orientation:  Full (Time, Place, and Person)  Thought Content:  Logical  Suicidal Thoughts:  No  Homicidal Thoughts:  No  Memory:  Immediate;   Good Recent;   Good Remote;   Good  Judgement:  Intact  Insight:  Present  Psychomotor Activity:  NA  Concentration:  Concentration: Good and Attention Span: Good  Recall:  Good  Fund of Knowledge:  Good  Language:  Good  Akathisia:  No  Handed:  Right  AIMS (if indicated):     Assets:  Communication Skills Desire for Improvement Housing Social Support Transportation  ADL's:  Intact  Cognition:  WNL  Sleep:  better     Assessment/Plan: Bipolar I disorder (HCC) - Plan: lamoTRIgine (LAMICTAL) 100 MG tablet, traZODone (DESYREL) 100 MG tablet, ziprasidone (GEODON) 80 MG capsule  Anxiety - Plan: lamoTRIgine (LAMICTAL) 100 MG tablet, DULoxetine (CYMBALTA) 60 MG capsule  Primary insomnia - Plan: traZODone (DESYREL) 100 MG tablet  Review psychosocial, blood work results, records and information from care everywhere.  She had tried propranolol and Imitrex for headaches and that did not help.  Now she is in the process of getting neurology appointment.  Patient does not want to change the medication.  We discussed polypharmacy but patient does not want to cut down the dose since she feels it has been helping her.  She has no side effects.  We discussed getting therapy and patient is trying but so far she is not able to connect and comfortable with previous therapist.  She really liked the Lamictal.  Continue 100 mg daily, continue Geodon 80 mg at bedtime, continue Cymbalta 60 mg 2 times  a day and trazodone 100 mg at bedtime.  We will continue to encourage to decrease the medication dosage if patient remains stable.  Encourage walking, watching her calorie intake, exercise.  She had lost weight but her target is to lose more weight and hoping the new program will help her.  I recommend to call us back if she has any question or any concern.  Follow-up in 3 months.  Follow Up Instructions:     I discussed the assessment and treatment plan with the patient. The patient was provided an opportunity to ask questions and all were answered. The patient agreed with the plan and demonstrated an understanding of the instructions.   The patient was advised to call back or seek an in-person evaluation if the symptoms worsen or if the condition fails to improve as anticipated.    Collaboration of Care: Other provider involved in patient's care AEB notes are  available in epic to review  Patient/Guardian was advised Release of Information must be obtained prior to any record release in order to collaborate their care with an outside provider. Patient/Guardian was advised if they have not already done so to contact the registration department to sign all necessary forms in order for [place] to release information regarding their care.   Consent: Patient/Guardian gives verbal consent for treatment and assignment of benefits for services provided during this visit. Patient/Guardian expressed understanding and agreed to proceed.     I provided 24 minutes of non face to face time during this encounter.  Note: This document was prepared by [employer] voice dictation technology and any errors that results from this process are unintentional.    Cleotis Nipper, MD 02/25/2023

## 2023-04-21 ENCOUNTER — Other Ambulatory Visit (HOSPITAL_COMMUNITY): Payer: Self-pay | Admitting: Psychiatry

## 2023-04-21 DIAGNOSIS — F319 Bipolar disorder, unspecified: Secondary | ICD-10-CM

## 2023-04-21 DIAGNOSIS — F5101 Primary insomnia: Secondary | ICD-10-CM

## 2023-05-15 ENCOUNTER — Other Ambulatory Visit (HOSPITAL_COMMUNITY): Payer: Self-pay | Admitting: Psychiatry

## 2023-05-15 DIAGNOSIS — F419 Anxiety disorder, unspecified: Secondary | ICD-10-CM

## 2023-05-27 ENCOUNTER — Telehealth (HOSPITAL_COMMUNITY): Payer: Medicare Other | Admitting: Psychiatry

## 2023-06-02 ENCOUNTER — Other Ambulatory Visit (HOSPITAL_COMMUNITY): Payer: Self-pay | Admitting: Psychiatry

## 2023-06-02 DIAGNOSIS — F319 Bipolar disorder, unspecified: Secondary | ICD-10-CM

## 2023-06-16 ENCOUNTER — Other Ambulatory Visit (HOSPITAL_COMMUNITY): Payer: Self-pay | Admitting: Psychiatry

## 2023-06-16 DIAGNOSIS — F319 Bipolar disorder, unspecified: Secondary | ICD-10-CM

## 2023-06-16 DIAGNOSIS — F419 Anxiety disorder, unspecified: Secondary | ICD-10-CM

## 2023-07-06 ENCOUNTER — Other Ambulatory Visit (HOSPITAL_COMMUNITY): Payer: Self-pay

## 2023-07-06 DIAGNOSIS — F419 Anxiety disorder, unspecified: Secondary | ICD-10-CM

## 2023-07-06 DIAGNOSIS — F319 Bipolar disorder, unspecified: Secondary | ICD-10-CM

## 2023-07-06 DIAGNOSIS — F5101 Primary insomnia: Secondary | ICD-10-CM

## 2023-07-06 MED ORDER — TRAZODONE HCL 100 MG PO TABS
100.0000 mg | ORAL_TABLET | Freq: Every day | ORAL | 0 refills | Status: DC
Start: 2023-07-06 — End: 2023-08-11

## 2023-07-20 ENCOUNTER — Telehealth (HOSPITAL_COMMUNITY): Payer: Medicare Other | Admitting: Psychiatry

## 2023-07-20 ENCOUNTER — Other Ambulatory Visit (HOSPITAL_COMMUNITY): Payer: Self-pay | Admitting: Psychiatry

## 2023-07-20 DIAGNOSIS — F319 Bipolar disorder, unspecified: Secondary | ICD-10-CM

## 2023-07-20 DIAGNOSIS — F5101 Primary insomnia: Secondary | ICD-10-CM

## 2023-08-11 ENCOUNTER — Encounter (HOSPITAL_COMMUNITY): Payer: Self-pay | Admitting: Psychiatry

## 2023-08-11 ENCOUNTER — Telehealth (HOSPITAL_BASED_OUTPATIENT_CLINIC_OR_DEPARTMENT_OTHER): Payer: Medicare Other | Admitting: Psychiatry

## 2023-08-11 VITALS — Wt 282.0 lb

## 2023-08-11 DIAGNOSIS — F419 Anxiety disorder, unspecified: Secondary | ICD-10-CM

## 2023-08-11 DIAGNOSIS — F319 Bipolar disorder, unspecified: Secondary | ICD-10-CM | POA: Diagnosis not present

## 2023-08-11 DIAGNOSIS — F5101 Primary insomnia: Secondary | ICD-10-CM | POA: Diagnosis not present

## 2023-08-11 MED ORDER — LAMOTRIGINE 100 MG PO TABS: ORAL_TABLET | ORAL | 0 refills | Status: DC

## 2023-08-11 MED ORDER — ZIPRASIDONE HCL 80 MG PO CAPS: 80.0000 mg | ORAL_CAPSULE | Freq: Every day | ORAL | 0 refills | Status: DC

## 2023-08-11 MED ORDER — DULOXETINE HCL 60 MG PO CPEP: 60.0000 mg | ORAL_CAPSULE | Freq: Two times a day (BID) | ORAL | 0 refills | Status: DC

## 2023-08-11 MED ORDER — TRAZODONE HCL 100 MG PO TABS: 100.0000 mg | ORAL_TABLET | Freq: Every day | ORAL | 0 refills | Status: DC

## 2023-08-11 NOTE — Progress Notes (Signed)
[facility] MD Virtual Progress Note   Patient Location: Home Provider Location: Home Office  I connect with patient by video and verified that I am speaking with correct person by using two identifiers. I discussed the limitations of evaluation and management by telemedicine and the availability of in person appointments. I also discussed with the patient that there may be a patient responsible charge related to this service. The patient expressed understanding and agreed to proceed.  Janet Hester [id number] 57 y.o.  08/11/2023 8:28 AM  History of Present Illness:  Patient is evaluated by video session.  She apologized missing last appointment.  She reported for past 2 weeks not taking her medication because she ran out.  She reported irritability, poor sleep, anxiety, depression.  She reported medicine is working until 2 weeks ago when she is completely out.  She reported headaches but did not go to neurology and talk to her primary care Dr Regino Schultze at [facility] who adjusted her blood pressure medicine.  In hydrochlorothiazide, amlodipine and losartan.  She is no longer taking propranolol and Imitrex.  Patient told her primary care believe it is fluctuation of blood pressure and high blood pressure causing headaches.  She denies any hallucination, paranoia but reported without medication and feeling very anxious, nervous and irritable.  She had a cruise trip in August with the family which she enjoyed.  She lived by herself and working from home for a travel [employer].  Patient not doing exercise but reported she lived in a third floor and she able to get steps in.  She had few pounds weight gain since the last visit.  She denies any recent manic episode denies any excessive buying, spending but feel like she will need the medicine as soon as possible because she has been out for 2 weeks.  She has not taken Ativan more than a year.  She has not able to find a therapist  that she feel comfortable and able to connect.  She does not feel she needed at this time.  She liked the combination of trazodone, Geodon, Cymbalta and Lamictal.  She has no rash, itching, tremors or shakes.  Past Psychiatric History: H/O depression, hallucination, mania, abuse and anger. H/O overdose and multiple inpatient. Last inpatient in 2019. Tried Paxil, Klonopin, Prozac, Wellbutrin,Topamax and trazodone.     Outpatient Encounter Medications as of 08/11/2023  Medication Sig   amLODipine (NORVASC) 2.5 MG tablet Take 1 tablet by mouth daily.   hydrochlorothiazide (HYDRODIURIL) 25 MG tablet Take 1 tablet by mouth daily.   atorvastatin (LIPITOR) 20 MG tablet Take by mouth.   cetirizine (ZYRTEC) 10 MG tablet Take 10 mg by mouth daily as needed for allergies.   DULoxetine (CYMBALTA) 60 MG capsule Take 1 capsule (60 mg total) by mouth 2 (two) times daily.   EPINEPHrine 0.3 mg/0.3 mL IJ SOAJ injection INJECT 0.3 MLS (0.3MG  TOTAL) INTO THE MUSCLE ONCE AS NEEDED FOR UP TO 1 DOSE FOR ANAPHYLAXIS   gabapentin (NEURONTIN) 300 MG capsule Take 300 mg by mouth at bedtime.   lamoTRIgine (LAMICTAL) 100 MG tablet Take one tab daily   losartan (COZAAR) 100 MG tablet Take 100 mg by mouth daily.   Omeprazole 20 MG TBEC Take 1 tablet by mouth daily for 6 weeks.   rivaroxaban (XARELTO) 20 MG TABS tablet Take 1 tablet (20 mg total) by mouth daily with supper.   traZODone (DESYREL) 100 MG tablet Take 1 tablet (100 mg total) by mouth at  bedtime.   Vitamin D, Ergocalciferol, (DRISDOL) 1.25 MG (50000 UNIT) CAPS capsule Take 1 capsule by mouth once a week.   ziprasidone (GEODON) 80 MG capsule Take 1 capsule (80 mg total) by mouth at bedtime.   [DISCONTINUED] DULoxetine (CYMBALTA) 60 MG capsule Take 1 capsule (60 mg total) by mouth 2 (two) times daily.   [DISCONTINUED] lamoTRIgine (LAMICTAL) 100 MG tablet Take one tab daily   [DISCONTINUED] LORazepam (ATIVAN) 0.5 MG tablet Take one tablet (0.5 mg total) by mouth  once daily as needed for severe anxiety. (Patient not taking: Reported on 07/11/2022)   [DISCONTINUED] propranolol (INDERAL) 20 MG tablet Take by mouth. (Patient not taking: Reported on 02/25/2023)   [DISCONTINUED] SUMAtriptan (IMITREX) 50 MG tablet Take 50 mg by mouth every 2 (two) hours as needed for headache or migraine. (Patient not taking: Reported on 02/25/2023)   [DISCONTINUED] traZODone (DESYREL) 100 MG tablet Take 1 tablet (100 mg total) by mouth at bedtime.   [DISCONTINUED] ziprasidone (GEODON) 80 MG capsule Take 1 capsule (80 mg total) by mouth at bedtime.   No facility-administered encounter medications on file as of 08/11/2023.    No results found for this or any previous visit (from the past 2160 hour(s)).   Psychiatric Specialty Exam: Physical Exam  Review of Systems  Neurological:  Positive for headaches.  Psychiatric/Behavioral:  Positive for sleep disturbance.     Weight 282 lb (127.9 kg).There is no height or weight on file to calculate BMI.  General Appearance: Casual  Eye Contact:  Fair  Speech:  Slow  Volume:  Decreased  Mood:  Anxious, Dysphoric, and Irritable  Affect:  Constricted  Thought Process:  Goal Directed  Orientation:  Full (Time, Place, and Person)  Thought Content:  Rumination  Suicidal Thoughts:  No  Homicidal Thoughts:  No  Memory:  Immediate;   Good Recent;   Good Remote;   Fair  Judgement:  Fair  Insight:  Present  Psychomotor Activity:  Decreased  Concentration:  Concentration: Fair and Attention Span: Fair  Recall:  Good  Fund of Knowledge:  Good  Language:  Good  Akathisia:  No  Handed:  Right  AIMS (if indicated):     Assets:  Communication Skills Desire for Improvement Housing Transportation  ADL's:  Intact  Cognition:  WNL  Sleep:  poor     Assessment/Plan: Bipolar I disorder (HCC) - Plan: lamoTRIgine (LAMICTAL) 100 MG tablet, ziprasidone (GEODON) 80 MG capsule, traZODone (DESYREL) 100 MG tablet  Anxiety - Plan:  DULoxetine (CYMBALTA) 60 MG capsule, lamoTRIgine (LAMICTAL) 100 MG tablet  Primary insomnia - Plan: traZODone (DESYREL) 100 MG tablet  I reviewed notes from primary care and blood work results.  Her BUN is 10 and creatinine 0.82.  Her other labs are stable.  Blood work was done earlier this month.  She is taking amlodipine, losartan and hydrochlorothiazide to help her blood pressure readings under control.  Her primary care believe not having a stable blood pressure causing headaches.  She is no longer taking propranolol and Imitrex.  We will continue Lamictal 100 mg daily, Geodon 80 mg at bedtime, Cymbalta 60 mg twice a day and trazodone 100 mg at bedtime.  Discussed compliance is important to avoid withdrawals and decompensation.  Recommended to call us back if she has any question or any concern.  Follow-up in 3 months.  Follow Up Instructions:     I discussed the assessment and treatment plan with the patient. The patient was provided an opportunity to ask  questions and all were answered. The patient agreed with the plan and demonstrated an understanding of the instructions.   The patient was advised to call back or seek an in-person evaluation if the symptoms worsen or if the condition fails to improve as anticipated.    Collaboration of Care: Other provider involved in patient's care AEB are available in epic to review  Patient/Guardian was advised Release of Information must be obtained prior to any record release in order to collaborate their care with an outside provider. Patient/Guardian was advised if they have not already done so to contact the registration department to sign all necessary forms in order for [place] to release information regarding their care.   Consent: Patient/Guardian gives verbal consent for treatment and assignment of benefits for services provided during this visit. Patient/Guardian expressed understanding and agreed to proceed.     I provided 24 minutes of non  face to face time during this encounter.  Note: This document was prepared by [employer] voice dictation technology and any errors that results from this process are unintentional.    Cleotis Nipper, MD 08/11/2023

## 2023-10-05 ENCOUNTER — Other Ambulatory Visit (HOSPITAL_COMMUNITY): Payer: Self-pay | Admitting: Psychiatry

## 2023-10-05 DIAGNOSIS — F5101 Primary insomnia: Secondary | ICD-10-CM

## 2023-10-05 DIAGNOSIS — F419 Anxiety disorder, unspecified: Secondary | ICD-10-CM

## 2023-10-05 DIAGNOSIS — F319 Bipolar disorder, unspecified: Secondary | ICD-10-CM

## 2023-10-12 ENCOUNTER — Other Ambulatory Visit (HOSPITAL_COMMUNITY): Payer: Self-pay | Admitting: Psychiatry

## 2023-10-12 DIAGNOSIS — F419 Anxiety disorder, unspecified: Secondary | ICD-10-CM

## 2023-10-12 DIAGNOSIS — F319 Bipolar disorder, unspecified: Secondary | ICD-10-CM

## 2023-11-10 ENCOUNTER — Encounter (HOSPITAL_COMMUNITY): Payer: Self-pay | Admitting: Psychiatry

## 2023-11-10 ENCOUNTER — Telehealth (HOSPITAL_COMMUNITY): Payer: Medicare Other | Admitting: Psychiatry

## 2023-11-10 VITALS — Wt 290.0 lb

## 2023-11-10 DIAGNOSIS — F319 Bipolar disorder, unspecified: Secondary | ICD-10-CM

## 2023-11-10 DIAGNOSIS — F419 Anxiety disorder, unspecified: Secondary | ICD-10-CM | POA: Diagnosis not present

## 2023-11-10 DIAGNOSIS — F5101 Primary insomnia: Secondary | ICD-10-CM

## 2023-11-10 MED ORDER — ZIPRASIDONE HCL 80 MG PO CAPS: 80.0000 mg | ORAL_CAPSULE | Freq: Every day | ORAL | 0 refills | Status: DC

## 2023-11-10 MED ORDER — LAMOTRIGINE 100 MG PO TABS: ORAL_TABLET | ORAL | 0 refills | Status: DC

## 2023-11-10 MED ORDER — DULOXETINE HCL 60 MG PO CPEP: 60.0000 mg | ORAL_CAPSULE | Freq: Two times a day (BID) | ORAL | 0 refills | Status: DC

## 2023-11-10 MED ORDER — TRAZODONE HCL 100 MG PO TABS: 100.0000 mg | ORAL_TABLET | Freq: Every day | ORAL | 0 refills | Status: DC

## 2023-11-10 NOTE — Progress Notes (Signed)
[facility] MD Virtual Progress Note   Patient Location: Home Provider Location: Home Office  I connect with patient by telephone and verified that I am speaking with correct person by using two identifiers. I discussed the limitations of evaluation and management by telemedicine and the availability of in person appointments. I also discussed with the patient that there may be a patient responsible charge related to this service. The patient expressed understanding and agreed to proceed.  Janet Hester [id number] 58 y.o.  11/10/2023 8:31 AM  History of Present Illness:  Patient is evaluated by phone session.  She could not do video as she was running behind the appointment.  She reported Thanksgiving and Christmas was difficult.  She had to go to [place] to attend to funeral back-to-back.  Patient told her cousin and aunt died 1 week apart.  She reported it was difficult and having a lot of crying spells but slowly and gradually she is doing better.  She continues to work part-time to keep herself busy.  Recently she had a visit with primary care earlier this month and she was pleased that her blood pressure is stable.  Her headaches are also much better.  She denies any mania, psychosis, hallucination.  She lived by herself and her 2 cats.  Her daughter live close by.  She admitted weight gain and she asked her primary care for [facility] but she did not approved as she do not have prediabetes.  She admitted to still struggle with getting therapy appointment and she feels she need counseling and therapy.  She denies any recent excessive buying, spending, irritability, aggression, violence.  She has not taken Ativan more than 18 months.  She has no tremors, shakes or any EPS.  She denies any major panic attack.  She denies drinking or using any illegal substances.  She is taking trazodone, Geodon, Cymbalta and Lamictal.  She is also taking 3 blood pressure medication.  Past  Psychiatric History: H/O depression, hallucination, mania, abuse and anger. H/O overdose and multiple inpatient. Last inpatient in 2019. Tried Paxil, Klonopin, Prozac, Wellbutrin,Topamax and trazodone.       Outpatient Encounter Medications as of 11/10/2023  Medication Sig   amLODipine (NORVASC) 2.5 MG tablet Take 1 tablet by mouth daily.   atorvastatin (LIPITOR) 20 MG tablet Take by mouth.   cetirizine (ZYRTEC) 10 MG tablet Take 10 mg by mouth daily as needed for allergies.   DULoxetine (CYMBALTA) 60 MG capsule Take 1 capsule (60 mg total) by mouth 2 (two) times daily.   EPINEPHrine 0.3 mg/0.3 mL IJ SOAJ injection INJECT 0.3 MLS (0.3MG  TOTAL) INTO THE MUSCLE ONCE AS NEEDED FOR UP TO 1 DOSE FOR ANAPHYLAXIS   gabapentin (NEURONTIN) 300 MG capsule Take 300 mg by mouth at bedtime.   hydrochlorothiazide (HYDRODIURIL) 25 MG tablet Take 1 tablet by mouth daily.   lamoTRIgine (LAMICTAL) 100 MG tablet Take one tab daily   losartan (COZAAR) 100 MG tablet Take 100 mg by mouth daily.   Omeprazole 20 MG TBEC Take 1 tablet by mouth daily for 6 weeks.   rivaroxaban (XARELTO) 20 MG TABS tablet Take 1 tablet (20 mg total) by mouth daily with supper.   traZODone (DESYREL) 100 MG tablet Take 1 tablet (100 mg total) by mouth at bedtime.   Vitamin D, Ergocalciferol, (DRISDOL) 1.25 MG (50000 UNIT) CAPS capsule Take 1 capsule by mouth once a week.   ziprasidone (GEODON) 80 MG capsule Take 1 capsule (80 mg total) by  mouth at bedtime.   No facility-administered encounter medications on file as of 11/10/2023.    No results found for this or any previous visit (from the past 2160 hours).   Psychiatric Specialty Exam: Physical Exam  Review of Systems  Weight 290 lb (131.5 kg).There is no height or weight on file to calculate BMI.  General Appearance: NA  Eye Contact:  NA  Speech:  Slow  Volume:  Normal  Mood:  Dysphoric  Affect:  NA  Thought Process:  Goal Directed  Orientation:  Full (Time, Place, and  Person)  Thought Content:  Rumination  Suicidal Thoughts:  No  Homicidal Thoughts:  No  Memory:  Immediate;   Good Recent;   Good Remote;   Fair  Judgement:  Intact  Insight:  Present  Psychomotor Activity:  NA  Concentration:  Concentration: Fair and Attention Span: Fair  Recall:  Good  Fund of Knowledge:  Good  Language:  Good  Akathisia:  No  Handed:  Right  AIMS (if indicated):     Assets:  Communication Skills Desire for Improvement Housing Social Support Transportation  ADL's:  Intact  Cognition:  WNL  Sleep:  ok     Assessment/Plan: Bipolar I disorder (HCC) - Plan: lamoTRIgine (LAMICTAL) 100 MG tablet, traZODone (DESYREL) 100 MG tablet, ziprasidone (GEODON) 80 MG capsule  Anxiety - Plan: DULoxetine (CYMBALTA) 60 MG capsule, lamoTRIgine (LAMICTAL) 100 MG tablet  Primary insomnia - Plan: traZODone (DESYREL) 100 MG tablet  I reviewed recent visit at her primary care physician which was done on January earlier this year.  Her blood pressure was 133/86.  Her weight is 290.  She admitted weight care and trying to lose weight.  We discussed polypharmacy and potential side effects of the medication.  Patient feel comfortable and does not want to change the medication.  I did talk to her about therapy and patient is trying to find a therapist but so far has not had good luck.  We will refer her therapist to our office and provided more names so she can look around.  I reviewed current medication.  She is on amlodipine, losartan, hydrochlorothiazide and she noticed blood pressure is stable which is also helping her headaches.  Her anxiety sleep and mood is also stable.  We will try to consider lowering the medication in the future if needed.  Encouraged to have a video session or in person on her future appointment.  Continue Cymbalta 60 mg twice a day, trazodone 100 mg at bedtime, Lamictal 100 mg daily and Geodon 80 mg at bedtime.  Recommended to call us back if she has any question  or any concern.  Follow-up in 3 months.   Follow Up Instructions:     I discussed the assessment and treatment plan with the patient. The patient was provided an opportunity to ask questions and all were answered. The patient agreed with the plan and demonstrated an understanding of the instructions.   The patient was advised to call back or seek an in-person evaluation if the symptoms worsen or if the condition fails to improve as anticipated.    Collaboration of Care: Other provider involved in patient's care AEB notes are available in epic to review  Patient/Guardian was advised Release of Information must be obtained prior to any record release in order to collaborate their care with an outside provider. Patient/Guardian was advised if they have not already done so to contact the registration department to sign all necessary forms in  order for [place] to release information regarding their care.   Consent: Patient/Guardian gives verbal consent for treatment and assignment of benefits for services provided during this visit. Patient/Guardian expressed understanding and agreed to proceed.     I provided 25 minutes of non face to face time during this encounter.  Note: This document was prepared by [employer] voice dictation technology and any errors that results from this process are unintentional.    Cleotis Nipper, MD 11/10/2023

## 2024-01-04 ENCOUNTER — Other Ambulatory Visit (HOSPITAL_COMMUNITY): Payer: Self-pay | Admitting: Psychiatry

## 2024-01-04 DIAGNOSIS — F319 Bipolar disorder, unspecified: Secondary | ICD-10-CM

## 2024-01-04 DIAGNOSIS — F5101 Primary insomnia: Secondary | ICD-10-CM

## 2024-01-05 ENCOUNTER — Other Ambulatory Visit (HOSPITAL_COMMUNITY): Payer: Self-pay | Admitting: Psychiatry

## 2024-01-05 DIAGNOSIS — F419 Anxiety disorder, unspecified: Secondary | ICD-10-CM

## 2024-01-05 DIAGNOSIS — F319 Bipolar disorder, unspecified: Secondary | ICD-10-CM

## 2024-01-11 ENCOUNTER — Other Ambulatory Visit (HOSPITAL_COMMUNITY): Payer: Self-pay | Admitting: Psychiatry

## 2024-01-11 DIAGNOSIS — F419 Anxiety disorder, unspecified: Secondary | ICD-10-CM

## 2024-01-11 DIAGNOSIS — F319 Bipolar disorder, unspecified: Secondary | ICD-10-CM

## 2024-02-08 ENCOUNTER — Telehealth (HOSPITAL_COMMUNITY): Payer: Medicare Other | Admitting: Psychiatry

## 2024-02-08 ENCOUNTER — Other Ambulatory Visit (HOSPITAL_COMMUNITY): Payer: Self-pay

## 2024-02-08 DIAGNOSIS — F319 Bipolar disorder, unspecified: Secondary | ICD-10-CM

## 2024-02-08 DIAGNOSIS — F419 Anxiety disorder, unspecified: Secondary | ICD-10-CM

## 2024-02-08 DIAGNOSIS — F5101 Primary insomnia: Secondary | ICD-10-CM

## 2024-02-08 MED ORDER — ZIPRASIDONE HCL 80 MG PO CAPS: 80.0000 mg | ORAL_CAPSULE | Freq: Every day | ORAL | 0 refills | Status: DC

## 2024-02-08 MED ORDER — LAMOTRIGINE 100 MG PO TABS: ORAL_TABLET | ORAL | 0 refills | Status: DC

## 2024-02-08 MED ORDER — DULOXETINE HCL 60 MG PO CPEP: 60.0000 mg | ORAL_CAPSULE | Freq: Two times a day (BID) | ORAL | 0 refills | Status: DC

## 2024-02-08 MED ORDER — TRAZODONE HCL 100 MG PO TABS: 100.0000 mg | ORAL_TABLET | Freq: Every day | ORAL | 0 refills | Status: DC

## 2024-02-22 ENCOUNTER — Other Ambulatory Visit (HOSPITAL_COMMUNITY): Payer: Self-pay | Admitting: Psychiatry

## 2024-02-22 DIAGNOSIS — F319 Bipolar disorder, unspecified: Secondary | ICD-10-CM

## 2024-02-22 DIAGNOSIS — F5101 Primary insomnia: Secondary | ICD-10-CM

## 2024-02-28 ENCOUNTER — Other Ambulatory Visit (HOSPITAL_COMMUNITY): Payer: Self-pay | Admitting: Psychiatry

## 2024-02-28 DIAGNOSIS — F319 Bipolar disorder, unspecified: Secondary | ICD-10-CM

## 2024-02-28 DIAGNOSIS — F419 Anxiety disorder, unspecified: Secondary | ICD-10-CM

## 2024-02-29 ENCOUNTER — Other Ambulatory Visit (HOSPITAL_COMMUNITY): Payer: Self-pay

## 2024-02-29 DIAGNOSIS — F5101 Primary insomnia: Secondary | ICD-10-CM

## 2024-02-29 DIAGNOSIS — F419 Anxiety disorder, unspecified: Secondary | ICD-10-CM

## 2024-02-29 DIAGNOSIS — F319 Bipolar disorder, unspecified: Secondary | ICD-10-CM

## 2024-02-29 MED ORDER — TRAZODONE HCL 100 MG PO TABS: 100.0000 mg | ORAL_TABLET | Freq: Every day | ORAL | 0 refills | Status: DC

## 2024-02-29 MED ORDER — LAMOTRIGINE 100 MG PO TABS: ORAL_TABLET | ORAL | 0 refills | Status: DC

## 2024-02-29 MED ORDER — ZIPRASIDONE HCL 80 MG PO CAPS: 80.0000 mg | ORAL_CAPSULE | Freq: Every day | ORAL | 0 refills | Status: DC

## 2024-03-01 ENCOUNTER — Telehealth (HOSPITAL_COMMUNITY): Admitting: Psychiatry

## 2024-03-14 ENCOUNTER — Other Ambulatory Visit (HOSPITAL_COMMUNITY): Payer: Self-pay | Admitting: Psychiatry

## 2024-03-14 DIAGNOSIS — F319 Bipolar disorder, unspecified: Secondary | ICD-10-CM

## 2024-03-14 DIAGNOSIS — F5101 Primary insomnia: Secondary | ICD-10-CM

## 2024-03-16 ENCOUNTER — Telehealth (HOSPITAL_COMMUNITY): Admitting: Psychiatry

## 2024-03-17 ENCOUNTER — Encounter (HOSPITAL_COMMUNITY): Payer: Self-pay | Admitting: Psychiatry

## 2024-03-17 ENCOUNTER — Telehealth (HOSPITAL_BASED_OUTPATIENT_CLINIC_OR_DEPARTMENT_OTHER): Admitting: Psychiatry

## 2024-03-17 VITALS — Wt 290.0 lb

## 2024-03-17 DIAGNOSIS — F319 Bipolar disorder, unspecified: Secondary | ICD-10-CM

## 2024-03-17 DIAGNOSIS — F5101 Primary insomnia: Secondary | ICD-10-CM

## 2024-03-17 DIAGNOSIS — F419 Anxiety disorder, unspecified: Secondary | ICD-10-CM | POA: Diagnosis not present

## 2024-03-17 MED ORDER — TRAZODONE HCL 100 MG PO TABS
100.0000 mg | ORAL_TABLET | Freq: Every day | ORAL | 0 refills | Status: DC
Start: 2024-03-17 — End: 2024-06-15

## 2024-03-17 MED ORDER — ZIPRASIDONE HCL 80 MG PO CAPS: 80.0000 mg | ORAL_CAPSULE | Freq: Every day | ORAL | 0 refills | Status: DC

## 2024-03-17 MED ORDER — LAMOTRIGINE 100 MG PO TABS: ORAL_TABLET | ORAL | 0 refills | Status: DC

## 2024-03-17 MED ORDER — DULOXETINE HCL 60 MG PO CPEP
60.0000 mg | ORAL_CAPSULE | Freq: Two times a day (BID) | ORAL | 0 refills | Status: DC
Start: 2024-03-17 — End: 2024-06-15

## 2024-03-17 NOTE — Progress Notes (Signed)
 [facility] MD Virtual Progress Note   Patient Location: Home Provider Location: Home Office  I connect with patient by video and verified that I am speaking with correct person by using two identifiers. I discussed the limitations of evaluation and management by telemedicine and the availability of in person appointments. I also discussed with the patient that there may be a patient responsible charge related to this service. The patient expressed understanding and agreed to proceed.  Janet Hester [id number] 58 y.o.  03/17/2024 9:51 AM  History of Present Illness:  Patient is evaluated by video session.  She had missed few appointments due to conflicts but reported medicine working okay and there is no major concern or issues.  She continues to work part-time to keep herself busy.  Patient told her oldest daughter moved to [place]  a few days ago and she already sent a train to get to visit her.  She has plan to go to visit her end of this month.  She has 2 other daughter who live nearby.  She denies any irritability, mania, anger, irritability.  She denies any suicidal thoughts or homicidal thoughts.  She reported her appetite is okay and weight is unchanged from the past.  She is taking the trazodone , Geodon , Cymbalta  and Lamictal .  She has no rash, itching tremors or shakes.  She has not taken Ativan  more than 2 years.  She has appointment coming up to see her primary care at [facility] in August.  She is taking all her blood pressure medication.  She wants to continue current medication.  She noticed her blood pressure is much better since she cut down the potato chips.  She is still on multiple antihypertensive medication.  She denies drinking or using any illegal substances.  She lives by herself.  Past Psychiatric History: H/O depression, hallucination, mania, abuse and anger. H/O overdose and multiple inpatient. Last inpatient in 2019. Tried Paxil , Klonopin , Prozac,  Wellbutrin ,Topamax  and trazodone .     Outpatient Encounter Medications as of 03/17/2024  Medication Sig   amLODipine (NORVASC) 2.5 MG tablet Take 1 tablet by mouth daily.   atorvastatin (LIPITOR) 20 MG tablet Take by mouth.   cetirizine (ZYRTEC) 10 MG tablet Take 10 mg by mouth daily as needed for allergies.   DULoxetine  (CYMBALTA ) 60 MG capsule Take 1 capsule (60 mg total) by mouth 2 (two) times daily.   EPINEPHrine  0.3 mg/0.3 mL IJ SOAJ injection INJECT 0.3 MLS (0.3MG  TOTAL) INTO THE MUSCLE ONCE AS NEEDED FOR UP TO 1 DOSE FOR ANAPHYLAXIS   gabapentin  (NEURONTIN ) 300 MG capsule Take 300 mg by mouth at bedtime.   hydrochlorothiazide (HYDRODIURIL) 25 MG tablet Take 1 tablet by mouth daily.   lamoTRIgine  (LAMICTAL ) 100 MG tablet Take one tab daily   losartan (COZAAR) 100 MG tablet Take 100 mg by mouth daily.   Omeprazole 20 MG TBEC Take 1 tablet by mouth daily for 6 weeks.   rivaroxaban  (XARELTO ) 20 MG TABS tablet Take 1 tablet (20 mg total) by mouth daily with supper.   traZODone  (DESYREL ) 100 MG tablet Take 1 tablet (100 mg total) by mouth at bedtime.   ziprasidone  (GEODON ) 80 MG capsule Take 1 capsule (80 mg total) by mouth at bedtime.   No facility-administered encounter medications on file as of 03/17/2024.    No results found for this or any previous visit (from the past 2160 hours).   Psychiatric Specialty Exam: Physical Exam  Review of Systems  Weight 290 lb (131.5  kg).There is no height or weight on file to calculate BMI.  General Appearance: Casual  Eye Contact:  Good  Speech:  Clear and Coherent and Normal Rate  Volume:  Normal  Mood:  Euthymic  Affect:  Congruent  Thought Process:  Goal Directed  Orientation:  Full (Time, Place, and Person)  Thought Content:  Logical  Suicidal Thoughts:  No  Homicidal Thoughts:  No  Memory:  Immediate;   Good Recent;   Good Remote;   Good  Judgement:  Intact  Insight:  Present  Psychomotor Activity:  Normal  Concentration:   Concentration: Good and Attention Span: Good  Recall:  Good  Fund of Knowledge:  Good  Language:  Good  Akathisia:  No  Handed:  Right  AIMS (if indicated):     Assets:  Communication Skills Desire for Improvement Housing Resilience Social Support Transportation  ADL's:  Intact  Cognition:  WNL  Sleep:  ok       12/20/2020    8:46 AM  Depression screen PHQ 2/9  Decreased Interest 0  Down, Depressed, Hopeless 0  PHQ - 2 Score 0    Assessment/Plan: Bipolar I disorder (HCC) - Plan: lamoTRIgine  (LAMICTAL ) 100 MG tablet, traZODone  (DESYREL ) 100 MG tablet, ziprasidone  (GEODON ) 80 MG capsule  Anxiety - Plan: DULoxetine  (CYMBALTA ) 60 MG capsule, lamoTRIgine  (LAMICTAL ) 100 MG tablet  Primary insomnia - Plan: traZODone  (DESYREL ) 100 MG tablet  Patient is stable on current medication.  She is also taking gabapentin  for chronic pain prescribed by PCP.  Encouraged to keep appointment with her physician for blood work and annual checkup.  She noticed since cut down the potato chips her blood pressure is much better but is still on losartan, hydrochlorothiazide and amlodipine.  She wants to continue current psychotropic medication which is keeping her mood stable and she is sleeping better.  Continue Cymbalta  twice a day 60 mg, trazodone  100 mg at bedtime, Lamictal  100 mg daily and Geodon  80 mg at bedtime.  Recommended to call us  back if she is any question or any concern.  Follow-up in 3 months.   Follow Up Instructions:     I discussed the assessment and treatment plan with the patient. The patient was provided an opportunity to ask questions and all were answered. The patient agreed with the plan and demonstrated an understanding of the instructions.   The patient was advised to call back or seek an in-person evaluation if the symptoms worsen or if the condition fails to improve as anticipated.    Collaboration of Care: Other provider involved in patient's care AEB notes are available  in epic to review  Patient/Guardian was advised Release of Information must be obtained prior to any record release in order to collaborate their care with an outside provider. Patient/Guardian was advised if they have not already done so to contact the registration department to sign all necessary forms in order for us  to release information regarding their care.   Consent: Patient/Guardian gives verbal consent for treatment and assignment of benefits for services provided during this visit. Patient/Guardian expressed understanding and agreed to proceed.     Total encounter time 24 minutes which includes face-to-face time, chart reviewed, care coordination, order entry and documentation during this encounter.   Note: This document was prepared by [employer] voice dictation technology and any errors that results from this process are unintentional.    Arturo Late, MD 03/17/2024

## 2024-05-15 ENCOUNTER — Other Ambulatory Visit (HOSPITAL_COMMUNITY): Payer: Self-pay | Admitting: Psychiatry

## 2024-05-15 DIAGNOSIS — F5101 Primary insomnia: Secondary | ICD-10-CM

## 2024-05-15 DIAGNOSIS — F319 Bipolar disorder, unspecified: Secondary | ICD-10-CM

## 2024-05-18 ENCOUNTER — Other Ambulatory Visit (HOSPITAL_COMMUNITY): Payer: Self-pay | Admitting: Psychiatry

## 2024-05-18 DIAGNOSIS — F419 Anxiety disorder, unspecified: Secondary | ICD-10-CM

## 2024-05-18 DIAGNOSIS — F319 Bipolar disorder, unspecified: Secondary | ICD-10-CM

## 2024-06-15 ENCOUNTER — Telehealth (HOSPITAL_COMMUNITY): Payer: Self-pay | Admitting: Professional

## 2024-06-15 ENCOUNTER — Telehealth (HOSPITAL_BASED_OUTPATIENT_CLINIC_OR_DEPARTMENT_OTHER): Admitting: Psychiatry

## 2024-06-15 ENCOUNTER — Encounter (HOSPITAL_COMMUNITY): Payer: Self-pay | Admitting: Psychiatry

## 2024-06-15 VITALS — Wt 290.0 lb

## 2024-06-15 DIAGNOSIS — F419 Anxiety disorder, unspecified: Secondary | ICD-10-CM | POA: Diagnosis not present

## 2024-06-15 DIAGNOSIS — F319 Bipolar disorder, unspecified: Secondary | ICD-10-CM

## 2024-06-15 DIAGNOSIS — F5101 Primary insomnia: Secondary | ICD-10-CM | POA: Diagnosis not present

## 2024-06-15 MED ORDER — LAMOTRIGINE 150 MG PO TABS
ORAL_TABLET | ORAL | 0 refills | Status: DC
Start: 2024-06-15 — End: 2024-07-07

## 2024-06-15 MED ORDER — ZIPRASIDONE HCL 80 MG PO CAPS: 80.0000 mg | ORAL_CAPSULE | Freq: Every day | ORAL | 0 refills | Status: DC

## 2024-06-15 MED ORDER — TRAZODONE HCL 100 MG PO TABS: 100.0000 mg | ORAL_TABLET | Freq: Every day | ORAL | 0 refills | Status: DC

## 2024-06-15 MED ORDER — DULOXETINE HCL 60 MG PO CPEP: 60.0000 mg | ORAL_CAPSULE | Freq: Two times a day (BID) | ORAL | 0 refills | Status: DC

## 2024-06-15 NOTE — Progress Notes (Signed)
 [facility] MD Virtual Progress Note   Patient Location: Home Provider Location: Home Office  I connect with patient by video and verified that I am speaking with correct person by using two identifiers. I discussed the limitations of evaluation and management by telemedicine and the availability of in person appointments. I also discussed with the patient that there may be a patient responsible charge related to this service. The patient expressed understanding and agreed to proceed.  Janet Hester [id number] 58 y.o.  06/15/2024 11:01 AM  History of Present Illness:  Patient is evaluated by video session.  She reported a lot of depression and having suicidal thoughts for past 2 weeks.  She reported feeling sad and depressed for more than 3 months but apologized did not mention in the last visit.  Recent event triggered her depression more than usual.  Her 36 year old granddaughter tried to commit suicide by taking overdose and cutting and required hospitalization for 10 days.  Patient feels bad because she cannot see her as daughter and granddaughter live in [place]  and moved 3 months ago.  She admitted missing her granddaughter a lot because when she was living in [place] she was keeping her granddaughter.  Patient told her suicidal thoughts comes and goes and usually when she feel intense then she called her daughter who comes in to stay with her.  Patient has 2 daughter who live close by.  She denies any suicidal plan but reported lack of energy, motivation, sleeping too much and does not want to go outside.  When asked why did not she informed us  before patient replied that she was concerned that she may require hospitalization which she does not want.  She also reported occasionally hallucination and hearing people voices but denies any aggression, violence.  She cut down her work and only working 22 hours a week.  Patient lives by herself but she had a good support from  her daughter who live close by.  She denies drinking or using any illegal substances.  She reported taking the medication as prescribed and reported no concern.  She has no rash, itching, tremors or shakes.  Her appetite is fair.  Her weight is unchanged from the past.  She eats once a day.  She is taking trazodone , Geodon , Cymbalta  and Lamictal .  Past Psychiatric History: H/O depression, hallucination, mania, abuse and anger. H/O overdose and multiple inpatient. Last inpatient in 2019. Tried Paxil , Klonopin , Prozac, Wellbutrin ,Topamax  and trazodone .    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    Shingles    Thrombus     Outpatient Encounter Medications as of 06/15/2024  Medication Sig   amLODipine (NORVASC) 2.5 MG tablet Take 1 tablet by mouth daily.   atorvastatin (LIPITOR) 20 MG tablet Take by mouth.   cetirizine (ZYRTEC) 10 MG tablet Take 10 mg by mouth daily as needed for allergies.   DULoxetine  (CYMBALTA ) 60 MG capsule Take 1 capsule (60 mg total) by mouth 2 (two) times daily.   EPINEPHrine  0.3 mg/0.3 mL IJ SOAJ injection INJECT 0.3 MLS (0.3MG  TOTAL) INTO THE MUSCLE ONCE AS NEEDED FOR UP TO 1 DOSE FOR ANAPHYLAXIS   gabapentin  (NEURONTIN ) 300 MG capsule Take 300 mg by mouth at bedtime.  hydrochlorothiazide (HYDRODIURIL) 25 MG tablet Take 1 tablet by mouth daily.   lamoTRIgine  (LAMICTAL ) 100 MG tablet Take one tab daily   losartan (COZAAR) 100 MG tablet Take 100 mg by mouth daily.   Omeprazole 20 MG TBEC Take 1 tablet by mouth daily for 6 weeks.   rivaroxaban  (XARELTO ) 20 MG TABS tablet Take 1 tablet (20 mg total) by mouth daily with supper.   traZODone  (DESYREL ) 100 MG tablet Take 1 tablet (100 mg total) by mouth at bedtime.   ziprasidone  (GEODON ) 80 MG capsule Take 1 capsule  (80 mg total) by mouth at bedtime.   No facility-administered encounter medications on file as of 06/15/2024.    No results found for this or any previous visit (from the past 2160 hours).   Psychiatric Specialty Exam: Physical Exam  Review of Systems  Constitutional:  Positive for fatigue.  Psychiatric/Behavioral:  Positive for dysphoric mood, hallucinations, sleep disturbance and suicidal ideas.     Weight 290 lb (131.5 kg).There is no height or weight on file to calculate BMI.  General Appearance: Casual  Eye Contact:  Fair  Speech:  Slow  Volume:  Decreased  Mood:  Depressed and Dysphoric  Affect:  Constricted and Depressed  Thought Process:  Descriptions of Associations: Intact  Orientation:  Full (Time, Place, and Person)  Thought Content:  Hallucinations: Auditory Hearing people voices, Paranoid Ideation, and Rumination  Suicidal Thoughts:  Yes.  without intent/plan  Homicidal Thoughts:  No  Memory:  Immediate;   Fair Recent;   Fair Remote;   Fair  Judgement:  Fair  Insight:  Present  Psychomotor Activity:  Decreased  Concentration:  Concentration: Fair and Attention Span: Fair  Recall:  Fair  Fund of Knowledge:  Good  Language:  Good  Akathisia:  No  Handed:  Right  AIMS (if indicated):     Assets:  Communication Skills Desire for Improvement Housing Social Support Transportation  ADL's:  Intact  Cognition:  WNL  Sleep:  too much       06/15/2024   11:22 AM 12/20/2020    8:46 AM  Depression screen PHQ 2/9  Decreased Interest 1 0  Down, Depressed, Hopeless 2 0  PHQ - 2 Score 3 0  Altered sleeping 2   Tired, decreased energy 2   Change in appetite 0   Feeling bad or failure about yourself  1   Trouble concentrating 1   Moving slowly or fidgety/restless 0   Suicidal thoughts 1   PHQ-9 Score 10   Difficult doing work/chores Somewhat difficult     Assessment/Plan: Bipolar I disorder (HCC) - Plan: lamoTRIgine  (LAMICTAL ) 150 MG tablet, traZODone   (DESYREL ) 100 MG tablet, ziprasidone  (GEODON ) 80 MG capsule  Anxiety - Plan: lamoTRIgine  (LAMICTAL ) 150 MG tablet, DULoxetine  (CYMBALTA ) 60 MG capsule  Primary insomnia - Plan: traZODone  (DESYREL ) 100 MG tablet  Patient is 58 year old African-American female with history of chronic pain, hypertension, history of DVT, bipolar disorder, anxiety and insomnia.  Discussed psychosocial stressors which may be contributing to her worsening of depression and suicidal thoughts.  Patient does not have any active plan and does not feel she need to be hospitalized at this time but open to do it if needed.  She has not seen therapist but interested to consider.  I discussed options of PHP/IOP and she is willing to do that.  I will also increase the Lamictal  from 100 mg to 150 mg.  Recommend to cut down the trazodone  from 100 mg to only  50 mg as sleeping too much.  Discussed to get some support from her family in case of crisis which usually helps.  Patient mention that she wants to live for her daughter and feels sorry that she cannot see her due to financial reason.  Continue Cymbalta  60 mg twice a day, Geodon  80 mg at bedtime.  She will try trazodone  50 mg and if she cannot sleep then she can take the whole tablet.  Increase Lamictal  150.  Referred to PHP/IOP.  Discussed safety concerns at any time having active suicidal thoughts or homicidal thoughts and she need to call 911 or go to local emergency room.  I also encouraged that she should be open to the provider/therapist about her symptoms and she apologized did not mention about depression worsening on the last visit.  She promised that she will be open to discuss about her symptoms in the future.  Will follow-up in 2 weeks or sooner if needed.   Follow Up Instructions:     I discussed the assessment and treatment plan with the patient. The patient was provided an opportunity to ask questions and all were answered. The patient agreed with the plan and  demonstrated an understanding of the instructions.   The patient was advised to call back or seek an in-person evaluation if the symptoms worsen or if the condition fails to improve as anticipated.    Collaboration of Care: Other provider involved in patient's care AEB notes are available in epic to review  Patient/Guardian was advised Release of Information must be obtained prior to any record release in order to collaborate their care with an outside provider. Patient/Guardian was advised if they have not already done so to contact the registration department to sign all necessary forms in order for us  to release information regarding their care.   Consent: Patient/Guardian gives verbal consent for treatment and assignment of benefits for services provided during this visit. Patient/Guardian expressed understanding and agreed to proceed.     Total encounter time 35 minutes which includes face-to-face time, chart reviewed, care coordination, order entry and documentation during this encounter.   Note: This document was prepared by [employer] voice dictation technology and any errors that results from this process are unintentional.    Leni ONEIDA Client, MD 06/15/2024

## 2024-06-15 NOTE — Telephone Encounter (Signed)
 See call log

## 2024-06-17 ENCOUNTER — Telehealth (HOSPITAL_COMMUNITY): Payer: Self-pay | Admitting: Licensed Clinical Social Worker

## 2024-06-20 NOTE — Telephone Encounter (Signed)
 See call log

## 2024-07-05 ENCOUNTER — Other Ambulatory Visit (HOSPITAL_COMMUNITY): Payer: Self-pay | Admitting: Psychiatry

## 2024-07-05 DIAGNOSIS — F319 Bipolar disorder, unspecified: Secondary | ICD-10-CM

## 2024-07-05 DIAGNOSIS — F419 Anxiety disorder, unspecified: Secondary | ICD-10-CM

## 2024-07-06 ENCOUNTER — Telehealth (HOSPITAL_COMMUNITY): Payer: Self-pay | Admitting: Psychiatry

## 2024-07-06 ENCOUNTER — Telehealth (HOSPITAL_COMMUNITY): Payer: Self-pay | Admitting: Professional

## 2024-07-06 NOTE — Telephone Encounter (Signed)
 D:  Patient was referred to virtual MH-IOP from [facility] d/t transportation issues.  A:  Placed call to orient pt.  Answered all her questions.  Pt states her main stressor is fiances and if her insurance doesn't cover all of the program she can't afford it.  Encouraged pt to contact her insurance company to verify her benefits for a MH-IOP.  Asked pt to call case manager back after she speaks to her insurance company.  R:  Pt receptive.

## 2024-07-06 NOTE — Telephone Encounter (Signed)
 See call log

## 2024-07-07 ENCOUNTER — Telehealth (HOSPITAL_COMMUNITY): Payer: Self-pay | Admitting: *Deleted

## 2024-07-07 DIAGNOSIS — F419 Anxiety disorder, unspecified: Secondary | ICD-10-CM

## 2024-07-07 DIAGNOSIS — F319 Bipolar disorder, unspecified: Secondary | ICD-10-CM

## 2024-07-07 MED ORDER — LAMOTRIGINE 150 MG PO TABS: ORAL_TABLET | ORAL | 0 refills | Status: DC

## 2024-07-07 NOTE — Telephone Encounter (Signed)
 Pt requesting #90 of the Lamictal  150 mg 1 tab daily.   Last visit: 06/15/24 Next visit: 09/27/24

## 2024-07-07 NOTE — Telephone Encounter (Signed)
 A 90-day prescription of Lamictal  150 mg sent to [facility] pharmacy.

## 2024-08-04 NOTE — Progress Notes (Signed)
 [facility]  - INTERNAL MEDICINE [facility] Medicare Wellness Visit Type:: Subsequent Annual Wellness Visit  Name: Janet Hester Date of Birth: 30-Apr-1966 Age: 58 y.o. MRN: [id number] Visit Date: 08/04/2024  History obtained from: patient  Living Arrangements/Support System/Health Assessment/Pain/Stress Marital status: single Number of children: 3 Occupation: Disabled Living arrangements: (!) lives alone Does the patient have a support system (family, friend, church, conservation officer, nature, etc)?: Yes   Do you have any dental concerns?: No In the past month, have you experienced a change in your bladder control?: No   Do you have any difficulty obtaining your medications?: No   Do you have trouble consistently taking or remembering to take all of your medications as prescribed?: No Patient rates overall stress level as: Some Does stress affect daily life?: No Typical amount of pain: some Does pain affect daily life?: No Are you currently prescribed opioids?: No                Depression Screening  Behavioral Health Screening  Patient Health Questionnaire-2 Score: 0 (08/04/2024  2:07 PM)      Patient's Depression screening/score = Negative        Social History (Tobacco/Drugs/Sexual Activity) Janet Hester reports that she has been smoking cigarettes. She started smoking about 41 years ago. She has a 40.9 pack-year smoking history. She has never used smokeless tobacco. Tobacco Use?: (!) Yes How many times in the past year have you used a recreational drug or used a prescription medication for nonmedical reasons?: None Risk factors for sexually transmitted infections (i.e., multiple sexual partners): No Are you bothered by sexual problems?: No  Alcohol Screening How often do you have a drink containing alcohol?: Never How many standard drinks containing alcohol do you have on a typical day?: Never, 1 or 2 drinks How often do you have six or more drinks on one  occasion?: Never Audit-C Score: 0  Physical Activity Regular exercise?: (!) No      Diet How many meals a day?: 2 Eats fruit and vegetables daily?: Yes Most meals are obtained by: preparing their own meals  Home and Transportation Safety All rugs have non-skid backing?: N/A, no rugs All stairs or steps have railings?: N/A, no stairs Grab bars in the bathtub or shower?: Yes Have non-skid surface in bathtub or shower?: Yes Good home lighting?: Yes Regular seat belt use?: Yes      Activities of Daily Living Feed self?: Yes Bathe self?: Yes Dress self?: Yes Use toilet without assistance?: Yes Walk without assistance?: Yes    Instrumental Activities of Daily Living Manage finances?: Yes Shop for themselves?: Yes Prepare meals?: Yes Use the telephone?: Yes Manage medications?: Yes   Performs basic housework/laundry?: Yes Drives?: Yes Primary transportation is: driving  Hearing Concerns about hearing?: No Uses hearing aids?: No Hear whispered voice? (Observed): Yes  Vision Concerns about vision?: No    Fall Risk Is the patient ambulatory?: Yes One or more falls in the last year:: No Feels unsteady when walking:: No  Cognitive Assessment Has a diagnosis of dementia or cognitive impairment?: No Are there any memory concerns by the patient, others, or providers?: No              Advance Directives Living will?: (!) No Advance directive information provided to patient: Yes Healthcare POA?: (!) No       Who is your in case of emergency contact?: Janet Hester Relationship to patient: Adult child Emergency contact's phone number: [phone]   Other  History I reviewed and updated the following risk factors and conditions as appropriate: Reviewed/Updated: Problem List, Medical History, Surgical History, Family History, Medications, Allergies Reviewed/Updated: Vital Signs (height, weight, and BP), Immunizations, Health Maintenance Patient Care  Team Updated: Done  Vital Signs BP (!) 152/104 (BP Location: Right arm)   Pulse 88   Ht 1.829 m (6')   Wt 133 kg (294 lb)   SpO2 95%   BMI 39.87 kg/m   Screening and Immunizations Health Maintenance Status       Date Due Completion Dates   Cervical Cancer Screening 08/18/2023 08/17/2018, 08/17/2018   Lung Cancer Screening 08/02/2024 08/03/2023, 04/29/2022   Breast Cancer Screening (Mammogram) 08/02/2024 08/03/2023, 09/29/2019   Diabetes Screening 08/05/2024 08/06/2023, 02/03/2023   Influenza Vaccine (1) 04/11/2025 (Originally 05/13/2024) 06/21/2014   Hepatitis B Vaccines (1 of 3 - 19+ 3-dose series) 08/04/2025 (Originally 05/23/1984) ---   COVID-19 Vaccine (4 - 2025-26 season) 08/04/2025 (Originally 06/13/2024) 09/15/2020, 01/16/2020   Depression Monitoring 02/02/2025 08/04/2024   Comprehensive Annual Visit 08/04/2025 08/04/2024, 02/03/2023   [employer] Annual Wellness (AWV) Subsequent Visits 08/04/2025 08/04/2024, 08/04/2024   Colorectal Cancer Screening 09/26/2025 09/26/2020, 09/26/2020   DTaP/Tdap/Td Vaccines (3 - Td or Tdap) 04/27/2032 04/27/2022, 07/19/2012   Adult RSV (50+ Years or Pregnancy) (1 - 1-dose 75+ series) 05/23/2040 ---       Immunization History  Administered Date(s) Administered   Influenza, Unspecified 06/21/2014   MMR 05/03/2013   Pfizer SARS-CoV-2 Primary Series 12+ yrs 12/26/2019, 01/16/2020, 09/15/2020   Pneumococcal Conjugate Vaccine 20-Valent (PREVNAR-20) 6 wks+ 02/02/2022   Pneumococcal Polysaccharide Vaccine, 23 Valent (PNEUMOVAX-23) 2Y+ 07/19/2012, 06/21/2014   TDAP VACCINE (BOOSTRIX,ADACEL) 7Y+ 07/19/2012   Varicella Zoster (SHINGRIX) 18Y+ 02/02/2022   Zoster, Live 08/08/2015    Assessment/Plan: Subsequent Annual Wellness Visit: The topics above were reviewed with the patient.  Healthy lifestyle principles reviewed.  Recommendations provided when indicated.  Follow up 1 year for next wellness visit.  Orders Placed This Encounter  Procedures    CT LDCT Lung Screening Annual   MG Breast Diag Tomo Bilat   US  Breast Limited Right   CBC with Differential   Comprehensive Metabolic Panel   TSH With Reflex To Free T4   Lipid Panel   Vitamin D, 25-Hydroxy   Hemoglobin A1C With Estimated Average Glucose   Ambulatory referral to Pain Medicine   Ambulatory Referral to Orthopedics    New Medications Ordered This Visit  Medications   doxycycline (VIBRA-TABS) 100 mg tablet    Sig: Take 1 tablet (100 mg total) by mouth 2 (two) times a day for 10 days. Take with 8 oz water. Do not lie down for at least 30 minutes after.    Dispense:  20 tablet    Refill:  0   traMADoL  (ULTRAM ) 50 mg tablet    Sig: Take 1 tablet (50 mg total) by mouth every 8 (eight) hours as needed for moderate pain (4-6).    Dispense:  15 tablet    Refill:  0    Patient Care Team: Talitha Flatten, MD as PCP - General (Internal Medicine) Talitha Flatten, MD as PCP - Attributed  Electronically signed by: Talitha Flatten, MD 08/04/2024 5:35 PM

## 2024-08-07 ENCOUNTER — Other Ambulatory Visit (HOSPITAL_COMMUNITY): Payer: Self-pay | Admitting: Psychiatry

## 2024-08-07 DIAGNOSIS — F319 Bipolar disorder, unspecified: Secondary | ICD-10-CM

## 2024-08-07 DIAGNOSIS — F5101 Primary insomnia: Secondary | ICD-10-CM

## 2024-08-16 ENCOUNTER — Other Ambulatory Visit (HOSPITAL_COMMUNITY): Payer: Self-pay | Admitting: Psychiatry

## 2024-08-16 DIAGNOSIS — F319 Bipolar disorder, unspecified: Secondary | ICD-10-CM

## 2024-09-08 ENCOUNTER — Other Ambulatory Visit (HOSPITAL_COMMUNITY): Payer: Self-pay | Admitting: Psychiatry

## 2024-09-08 DIAGNOSIS — F319 Bipolar disorder, unspecified: Secondary | ICD-10-CM

## 2024-09-08 DIAGNOSIS — F419 Anxiety disorder, unspecified: Secondary | ICD-10-CM

## 2024-09-24 ENCOUNTER — Other Ambulatory Visit (HOSPITAL_COMMUNITY): Payer: Self-pay | Admitting: Psychiatry

## 2024-09-24 DIAGNOSIS — F419 Anxiety disorder, unspecified: Secondary | ICD-10-CM

## 2024-09-27 ENCOUNTER — Encounter (HOSPITAL_COMMUNITY): Payer: Self-pay | Admitting: Psychiatry

## 2024-09-27 ENCOUNTER — Telehealth (HOSPITAL_COMMUNITY): Admitting: Psychiatry

## 2024-09-27 VITALS — Wt 294.0 lb

## 2024-09-27 DIAGNOSIS — F5101 Primary insomnia: Secondary | ICD-10-CM

## 2024-09-27 DIAGNOSIS — F419 Anxiety disorder, unspecified: Secondary | ICD-10-CM

## 2024-09-27 DIAGNOSIS — F319 Bipolar disorder, unspecified: Secondary | ICD-10-CM

## 2024-09-27 MED ORDER — TRAZODONE HCL 150 MG PO TABS: 150.0000 mg | ORAL_TABLET | Freq: Every day | ORAL | 0 refills | Status: AC

## 2024-09-27 MED ORDER — LAMOTRIGINE 150 MG PO TABS: ORAL_TABLET | ORAL | 0 refills | Status: AC

## 2024-09-27 MED ORDER — ZIPRASIDONE HCL 80 MG PO CAPS: 80.0000 mg | ORAL_CAPSULE | Freq: Every day | ORAL | 0 refills | Status: AC

## 2024-09-27 MED ORDER — DULOXETINE HCL 60 MG PO CPEP: 60.0000 mg | ORAL_CAPSULE | Freq: Two times a day (BID) | ORAL | 0 refills | Status: AC

## 2024-09-27 NOTE — Progress Notes (Signed)
 [facility] MD Virtual Progress Note   Patient Location: Home Provider Location: Home Office  I connect with patient by video and verified that I am speaking with correct person by using two identifiers. I discussed the limitations of evaluation and management by telemedicine and the availability of in person appointments. I also discussed with the patient that there may be a patient responsible charge related to this service. The patient expressed understanding and agreed to proceed.  Janet Hester [id number] 58 y.o.  09/27/2024 8:43 AM  History of Present Illness:  Patient is evaluated by video session.  On the last visit she reported having suicidal thoughts which are comes and goes and feeling very sad and depressed.  Apparently her 11 year old granddaughter tried to commit suicide at that time by taking overdose and that triggered her symptoms.  We recommended IOP/PHP but patient could not do due to financial reason and insurance reason.  Patient told she is doing better and no more suicidal thoughts as able to talk to her daughters and that helped.  Her younger daughter live close by and she is checking on her almost daily.  She also had a good Thanksgiving because she was able to see most of her grandchildren and her 3 daughters.  Her daughter came from [place] in [place] .  She excited as going to spend Christmas in [place]  with her oldest daughter and grandkids.  Patient reported she is without the Geodon  for past few days and is struggling with insomnia.  She is only sleeping a few hours and noticed that triggers her irritability next day.  She denies any active or passive suicidal thoughts but is still have trust issue and sometimes paranoia.  Her visual level is fair.  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  Patient is also relieved that her granddaughter is doing better on the medication.  She occasionally have crying spells and  racing thoughts but denies any aggression, violence, mania, impulsive behavior.  She has no tremor or shakes or any EPS.  Recently had a visit with primary care and no new medication added.  Her labs are stable.  Her appetite is fair.  Her weight is slightly increased from the past.  Most of the time she stays home unless she need to go outside.  Patient lives by herself but very close contact with her younger daughter who live close by.  She has a daughter who lives in [place] who also talked to her regularly.  Patient denies drinking or using any illegal substances.  Past Psychiatric History: H/O depression, hallucination, mania, abuse and anger. H/O overdose and multiple inpatient. Last inpatient in 2019. Tried Paxil , Klonopin , Prozac, Wellbutrin ,Topamax  and trazodone .    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    Shingles    Thrombus     Outpatient Encounter Medications as of 09/27/2024  Medication Sig   amLODipine (NORVASC) 2.5 MG tablet Take 1 tablet by mouth daily.   atorvastatin (LIPITOR) 20 MG tablet Take by mouth.   cetirizine (ZYRTEC) 10 MG tablet Take 10 mg by mouth daily as needed for allergies.   DULoxetine  (CYMBALTA ) 60 MG capsule Take 1 capsule (60 mg total) by mouth 2 (two)  times daily.   EPINEPHrine  0.3 mg/0.3 mL IJ SOAJ injection INJECT 0.3 MLS (0.3MG  TOTAL) INTO THE MUSCLE ONCE AS NEEDED FOR UP TO 1 DOSE FOR ANAPHYLAXIS   gabapentin  (NEURONTIN ) 300 MG capsule Take 300 mg by mouth at bedtime.   hydrochlorothiazide (HYDRODIURIL) 25 MG tablet Take 1 tablet by mouth daily.   lamoTRIgine  (LAMICTAL ) 150 MG tablet Take one tab daily   losartan (COZAAR) 100 MG tablet Take 100 mg by mouth daily.   Omeprazole 20 MG TBEC Take 1 tablet by mouth daily for 6 weeks.    rivaroxaban  (XARELTO ) 20 MG TABS tablet Take 1 tablet (20 mg total) by mouth daily with supper.   traZODone  (DESYREL ) 100 MG tablet Take 1 tablet (100 mg total) by mouth at bedtime.   ziprasidone  (GEODON ) 80 MG capsule Take 1 capsule (80 mg total) by mouth at bedtime.   No facility-administered encounter medications on file as of 09/27/2024.    No results found for this or any previous visit (from the past 2160 hours).   Psychiatric Specialty Exam: Physical Exam  Review of Systems  Psychiatric/Behavioral:  Positive for dysphoric mood and sleep disturbance.     Weight 294 lb (133.4 kg).There is no height or weight on file to calculate BMI.  General Appearance: Casual  Eye Contact:  Good  Speech:  Slow  Volume:  Decreased  Mood:  Dysphoric  Affect:  Congruent  Thought Process:  Descriptions of Associations: Intact  Orientation:  Full (Time, Place, and Person)  Thought Content:  Paranoid Ideation, Rumination, and trust issues  Suicidal Thoughts:  No  Homicidal Thoughts:  No  Memory:  Immediate;   Fair Recent;   Fair Remote;   Fair  Judgement:  Fair  Insight:  Present  Psychomotor Activity:  Decreased  Concentration:  Concentration: Fair and Attention Span: Fair  Recall:  Fair  Fund of Knowledge:  Good  Language:  Good  Akathisia:  No  Handed:  Right  AIMS (if indicated):     Assets:  Communication Skills Desire for Improvement Housing Social Support Transportation  ADL's:  Intact  Cognition:  WNL  Sleep:  poor       06/15/2024   11:22 AM 12/20/2020    8:46 AM  Depression screen PHQ 2/9  Decreased Interest 1 0  Down, Depressed, Hopeless 2 0  PHQ - 2 Score 3 0  Altered sleeping 2   Tired, decreased energy 2   Change in appetite 0   Feeling bad or failure about yourself  1   Trouble concentrating 1   Moving slowly or fidgety/restless 0   Suicidal thoughts 1   PHQ-9 Score 10    Difficult doing work/chores Somewhat difficult      Data saved with a previous  flowsheet row definition    Assessment/Plan: Bipolar I disorder (HCC) - Plan: traZODone  (DESYREL ) 150 MG tablet, lamoTRIgine  (LAMICTAL ) 150 MG tablet, ziprasidone  (GEODON ) 80 MG capsule  Primary insomnia - Plan: traZODone  (DESYREL ) 150 MG tablet  Anxiety - Plan: DULoxetine  (CYMBALTA ) 60 MG capsule, lamoTRIgine  (LAMICTAL ) 150 MG tablet  Patient is 58 year old African-American female with history of chronic pain, hypertension, history of DVT, bipolar disorder, anxiety and insomnia.  Reviewed collateral information from other providers.  Patient could not do group therapy because of finances and insurance reason.  She is doing better since talking to daughter on a regular basis and her younger daughter comes frequently and checks on her.  We have increased the Lamictal  on the last visit  and that helps her depression.  We recommended to cut down the trazodone  because she was sleeping too much but now she is not sleeping very well despite taking 100 mg full dose of trazodone .  Recommend to increase trazodone  150 mg at bedtime.  She is also out of Geodon  for past few days, recommend to call us  if she is running low or out of medication to avoid noncompliance with medication.  I also discussed to consider therapy as patient not able to do group therapy.  Patient agree and like to get a referral for individual therapy.  Will keep the Lamictal  150 since it is working and helping her.  Will continue Geodon  80 mg at bedtime.  I recommend if increased dose of trazodone  did not help then she can supplement melatonin.  Discussed safety concerns at any time having active suicidal thoughts or homicidal that she need to call 911 or go to local emergency room.  Will follow-up in 3 months unless patient need a sooner appointment.  Follow Up Instructions:     I discussed the assessment and treatment plan with the patient. The patient was provided an opportunity to ask questions and all were answered. The patient agreed  with the plan and demonstrated an understanding of the instructions.   The patient was advised to call back or seek an in-person evaluation if the symptoms worsen or if the condition fails to improve as anticipated.    Collaboration of Care: Other provider involved in patient's care AEB notes are available in epic to review  Patient/Guardian was advised Release of Information must be obtained prior to any record release in order to collaborate their care with an outside provider. Patient/Guardian was advised if they have not already done so to contact the registration department to sign all necessary forms in order for us  to release information regarding their care.   Consent: Patient/Guardian gives verbal consent for treatment and assignment of benefits for services provided during this visit. Patient/Guardian expressed understanding and agreed to proceed.     Total encounter time 30 minutes which includes face-to-face time, chart reviewed, care coordination, order entry and documentation during this encounter.   Note: This document was prepared by [employer] voice dictation technology and any errors that results from this process are unintentional.    Leni ONEIDA Client, MD 09/27/2024

## 2024-11-15 ENCOUNTER — Ambulatory Visit

## 2024-11-15 ENCOUNTER — Encounter: Payer: Self-pay | Admitting: Emergency Medicine

## 2024-11-15 ENCOUNTER — Ambulatory Visit
Admission: EM | Admit: 2024-11-15 | Discharge: 2024-11-15 | Disposition: A | Discharge status: Home / Self Care | Source: Home / Self Care

## 2024-11-15 ENCOUNTER — Other Ambulatory Visit: Payer: Self-pay

## 2024-11-15 DIAGNOSIS — S300XXA Contusion of lower back and pelvis, initial encounter: Secondary | ICD-10-CM | POA: Diagnosis not present

## 2024-11-15 DIAGNOSIS — M5441 Lumbago with sciatica, right side: Secondary | ICD-10-CM

## 2024-11-15 DIAGNOSIS — W009XXA Unspecified fall due to ice and snow, initial encounter: Secondary | ICD-10-CM

## 2024-11-15 MED ORDER — HYDROCODONE-ACETAMINOPHEN 5-325 MG PO TABS: 1.0000 | ORAL_TABLET | ORAL | 0 refills | Status: AC | PRN

## 2024-11-15 MED ORDER — LIDOCAINE 5 % EX PTCH: 1.0000 | MEDICATED_PATCH | CUTANEOUS | 0 refills | Status: AC

## 2024-11-15 MED ORDER — BACLOFEN 10 MG PO TABS: 10.0000 mg | ORAL_TABLET | Freq: Three times a day (TID) | ORAL | 0 refills | Status: AC

## 2024-11-15 NOTE — ED Triage Notes (Addendum)
 Pt here for slip and fall on ice 5 days ago landing on buttocks; pt sts increasing pain in back, tail bone area and right hip with radiation down right leg; pt sts OTC meds not helping

## 2024-11-15 NOTE — Discharge Instructions (Addendum)
 Discusses concern for change of bladder and bowel habits in office, and that may indicate an injury to your spinal cord. We discussed needing a CT scan or MRI which you can have in the ED or you can call your PCP and have them order outpatient for your. If you decide not to go to ED, I would contact your PCP before the end of this week.

## 2024-12-27 ENCOUNTER — Telehealth (HOSPITAL_COMMUNITY): Admitting: Psychiatry
